# Patient Record
Sex: Female | Born: 1985 | Race: White | Hispanic: No | Marital: Single | Smoking: Former smoker
Health system: Southern US, Community
[De-identification: ages and names within clinical notes are randomized; demographics above are authoritative.]

## PROBLEM LIST (undated history)

## (undated) ENCOUNTER — Inpatient Hospital Stay (HOSPITAL_COMMUNITY): Payer: Self-pay

## (undated) ENCOUNTER — Emergency Department (HOSPITAL_COMMUNITY): Admission: EM | Payer: Self-pay | Source: Home / Self Care

## (undated) DIAGNOSIS — F419 Anxiety disorder, unspecified: Secondary | ICD-10-CM

## (undated) DIAGNOSIS — K529 Noninfective gastroenteritis and colitis, unspecified: Secondary | ICD-10-CM

## (undated) DIAGNOSIS — K5792 Diverticulitis of intestine, part unspecified, without perforation or abscess without bleeding: Secondary | ICD-10-CM

## (undated) DIAGNOSIS — F32A Depression, unspecified: Secondary | ICD-10-CM

## (undated) DIAGNOSIS — F329 Major depressive disorder, single episode, unspecified: Secondary | ICD-10-CM

## (undated) DIAGNOSIS — I82409 Acute embolism and thrombosis of unspecified deep veins of unspecified lower extremity: Secondary | ICD-10-CM

## (undated) DIAGNOSIS — J45909 Unspecified asthma, uncomplicated: Secondary | ICD-10-CM

## (undated) HISTORY — DX: Unspecified asthma, uncomplicated: J45.909

## (undated) HISTORY — PX: DILATION AND CURETTAGE OF UTERUS: SHX78

## (undated) HISTORY — DX: Depression, unspecified: F32.A

## (undated) HISTORY — DX: Anxiety disorder, unspecified: F41.9

## (undated) HISTORY — PX: LEG SURGERY: SHX1003

## (undated) HISTORY — PX: APPENDECTOMY: SHX54

## (undated) HISTORY — PX: CHOLECYSTECTOMY: SHX55

## (undated) HISTORY — DX: Noninfective gastroenteritis and colitis, unspecified: K52.9

## (undated) HISTORY — PX: TYMPANOSTOMY TUBE PLACEMENT: SHX32

## (undated) HISTORY — PX: FOOT SURGERY: SHX648

---
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
## 2013-07-26 ENCOUNTER — Emergency Department (HOSPITAL_COMMUNITY): Payer: Medicaid Other

## 2013-07-26 ENCOUNTER — Encounter (HOSPITAL_COMMUNITY): Payer: Self-pay | Admitting: Emergency Medicine

## 2013-07-26 ENCOUNTER — Emergency Department (HOSPITAL_COMMUNITY)
Admission: EM | Admit: 2013-07-26 | Discharge: 2013-07-26 | Disposition: A | Discharge status: Home / Self Care | Payer: Medicaid Other | Attending: Emergency Medicine | Admitting: Emergency Medicine

## 2013-07-26 ENCOUNTER — Emergency Department (HOSPITAL_COMMUNITY)
Admission: EM | Admit: 2013-07-26 | Discharge: 2013-07-26 | Disposition: A | Discharge status: Home / Self Care | Payer: Medicaid Other | Source: Home / Self Care | Attending: Emergency Medicine | Admitting: Emergency Medicine

## 2013-07-26 DIAGNOSIS — Z3202 Encounter for pregnancy test, result negative: Secondary | ICD-10-CM | POA: Insufficient documentation

## 2013-07-26 DIAGNOSIS — K029 Dental caries, unspecified: Secondary | ICD-10-CM | POA: Insufficient documentation

## 2013-07-26 DIAGNOSIS — R42 Dizziness and giddiness: Secondary | ICD-10-CM | POA: Insufficient documentation

## 2013-07-26 DIAGNOSIS — T887XXA Unspecified adverse effect of drug or medicament, initial encounter: Secondary | ICD-10-CM

## 2013-07-26 DIAGNOSIS — N189 Chronic kidney disease, unspecified: Secondary | ICD-10-CM | POA: Insufficient documentation

## 2013-07-26 DIAGNOSIS — H9209 Otalgia, unspecified ear: Secondary | ICD-10-CM | POA: Insufficient documentation

## 2013-07-26 DIAGNOSIS — R110 Nausea: Secondary | ICD-10-CM | POA: Insufficient documentation

## 2013-07-26 DIAGNOSIS — R6884 Jaw pain: Secondary | ICD-10-CM | POA: Insufficient documentation

## 2013-07-26 DIAGNOSIS — K0889 Other specified disorders of teeth and supporting structures: Secondary | ICD-10-CM

## 2013-07-26 DIAGNOSIS — F458 Other somatoform disorders: Secondary | ICD-10-CM | POA: Insufficient documentation

## 2013-07-26 DIAGNOSIS — Z79899 Other long term (current) drug therapy: Secondary | ICD-10-CM | POA: Insufficient documentation

## 2013-07-26 DIAGNOSIS — Z880 Allergy status to penicillin: Secondary | ICD-10-CM | POA: Insufficient documentation

## 2013-07-26 DIAGNOSIS — Z792 Long term (current) use of antibiotics: Secondary | ICD-10-CM | POA: Insufficient documentation

## 2013-07-26 DIAGNOSIS — F17200 Nicotine dependence, unspecified, uncomplicated: Secondary | ICD-10-CM | POA: Insufficient documentation

## 2013-07-26 DIAGNOSIS — T368X5A Adverse effect of other systemic antibiotics, initial encounter: Secondary | ICD-10-CM | POA: Insufficient documentation

## 2013-07-26 LAB — I-STAT TROPONIN, ED: TROPONIN I, POC: 0.01 ng/mL (ref 0.00–0.08)

## 2013-07-26 LAB — CBC
HCT: 42.4 % (ref 36.0–46.0)
HEMOGLOBIN: 15.3 g/dL — AB (ref 12.0–15.0)
MCH: 29.1 pg (ref 26.0–34.0)
MCHC: 36.1 g/dL — ABNORMAL HIGH (ref 30.0–36.0)
MCV: 80.8 fL (ref 78.0–100.0)
Platelets: 193 10*3/uL (ref 150–400)
RBC: 5.25 MIL/uL — AB (ref 3.87–5.11)
RDW: 12.5 % (ref 11.5–15.5)
WBC: 9 10*3/uL (ref 4.0–10.5)

## 2013-07-26 LAB — BASIC METABOLIC PANEL
BUN: 5 mg/dL — ABNORMAL LOW (ref 6–23)
CHLORIDE: 103 meq/L (ref 96–112)
CO2: 26 meq/L (ref 19–32)
Calcium: 9 mg/dL (ref 8.4–10.5)
Creatinine, Ser: 0.57 mg/dL (ref 0.50–1.10)
GFR calc Af Amer: >90 mL/min (ref >90)
GFR calc non Af Amer: >90 mL/min (ref >90)
Glucose, Bld: 82 mg/dL (ref 70–99)
Potassium: 3.4 mEq/L — ABNORMAL LOW (ref 3.7–5.3)
SODIUM: 141 meq/L (ref 137–147)

## 2013-07-26 LAB — PREGNANCY, URINE: PREG TEST UR: NEGATIVE

## 2013-07-26 LAB — PRO B NATRIURETIC PEPTIDE: Pro B Natriuretic peptide (BNP): 8 pg/mL (ref 0–125)

## 2013-07-26 MED ORDER — CLINDAMYCIN HCL 300 MG PO CAPS
300.0000 mg | ORAL_CAPSULE | Freq: Once | ORAL | Status: AC
  Administered 2013-07-26: 300 mg via ORAL
  Filled 2013-07-26: qty 1

## 2013-07-26 MED ORDER — CEPHALEXIN 500 MG PO CAPS: 500.0000 mg | ORAL_CAPSULE | Freq: Four times a day (QID) | ORAL | Status: DC

## 2013-07-26 MED ORDER — HYDROCODONE-ACETAMINOPHEN 5-325 MG PO TABS
1.0000 | ORAL_TABLET | Freq: Once | ORAL | Status: AC
  Administered 2013-07-26: 1 via ORAL
  Filled 2013-07-26: qty 1

## 2013-07-26 MED ORDER — CLINDAMYCIN HCL 300 MG PO CAPS: 300.0000 mg | ORAL_CAPSULE | Freq: Three times a day (TID) | ORAL | Status: DC

## 2013-07-26 MED ORDER — HYDROCODONE-ACETAMINOPHEN 5-325 MG PO TABS
1.0000 | ORAL_TABLET | Freq: Four times a day (QID) | ORAL | Status: DC | PRN
Start: 2013-07-26 — End: 2016-05-06

## 2013-07-26 NOTE — Discharge Instructions (Signed)
Return to the ED with any concerns including fever/chills, difficulty breathing, swelling of lips or tongue, fainting, decreased level of alertness/lethargy, or any other alarming symptoms  You should stop taking the clindamycin that you were prescribed earlier today

## 2013-07-26 NOTE — ED Notes (Signed)
Pt c/o left sided dental/jaw pain that her doctor believes is from grinding her teeth, now she has ear pain as well and pain not being relieved by ibuprofen and cant wait for her dentist apt in two weeks.

## 2013-07-26 NOTE — Progress Notes (Signed)
  CARE MANAGEMENT ED NOTE 07/26/2013  Patient:  Abigail James,Abigail James   Account Number:  [id number]  Date Initiated:  07/26/2013  Documentation initiated by:  Radford PaxFERRERO,Keevin Panebianco  Subjective/Objective Assessment:   Patient presents to Ed with chest pain dizziness and nausea.  Patient believes she is having an allergic reaction to medications she was prescribed earlier this am.     Subjective/Objective Assessment Detail:     Action/Plan:   Action/Plan Detail:   Anticipated DC Date:  07/26/2013     Status Recommendation to Physician:   Result of Recommendation:    Other ED Services  Consult Working Plan    DC Planning Services  Other  PCP issues    Choice offered to / List presented to:            Status of service:  Completed, signed off  ED Comments:   ED Comments Detail:  EDCM spoke to patient at bedside.  Patient reports she does not have a pcp with Medicaid insurnace.  [facility] asked patient if there was a doctor listed on her Medicaid card.  Patient replied, "[facility]."  [facility].  Patient reports she has not been there to see anyone as of yet.  [facility] informed patient if she wanted to change pcps on Medicaid card, she would have to inform DSS of the change made.  Patient verbalized understanding.  No further EDCM needs at this time.

## 2013-07-26 NOTE — ED Notes (Signed)
Pt states that around 1300 today started having central chest pain and dizziness with nausea that she thinks is caused be allergic reaction to the medications she was prescribed from here earlier this morning.

## 2013-07-26 NOTE — Discharge Instructions (Signed)
Take clindamycin for a week.   Continue taking motrin.   Take vicodin for severe pain. Do NOT drive with it.   Follow up with your dentist.   Return to ER if you have fever, worse pain.

## 2013-07-26 NOTE — ED Provider Notes (Signed)
CSN: [id number]     Arrival date & time 07/26/13  1520 History   First MD Initiated Contact with Patient 07/26/13 1537     Chief Complaint  Patient presents with  . Chest Pain  . Nausea  . Dizziness     (Consider location/radiation/quality/duration/timing/severity/associated sxs/prior Treatment) HPI Pt presents with c/o chest pain with dizziness which began after taking clindamycin which was prescribed earlier today.  She has been having jaw pain in her left jaw and has dental appointment scheduled for tomorrow.  No fever, no difficulty breathing or swallowing.  She was given rx for clindamycin and vicodin- states she only took clindamycin and about 30 minutes later felt dizziness and central chest pain.  No rash, no lip or tongue swelling.  No vomiting.  There are no other associated systemic symptoms, there are no other alleviating or modifying factors.   History reviewed. No pertinent past medical history. Past Surgical History  Procedure Laterality Date  . Cholecystectomy    . Appendectomy    . Tympanostomy tube placement    . Foot surgery    . Leg surgery    . Dilation and curettage of uterus     No family history on file. History  Substance Use Topics  . Smoking status: Current Every Day Smoker    Types: Cigarettes  . Smokeless tobacco: Never Used  . Alcohol Use: Not on file   OB History   Grav Para Term Preterm Abortions TAB SAB Ect Mult Living                 Review of Systems ROS reviewed and all otherwise negative except for mentioned in HPI    Allergies  Ciprofloxacin; Clindamycin/lincomycin; Sulfa antibiotics; Amoxicillin; and Penicillins  Home Medications   Prior to Admission medications   Medication Sig Start Date End Date Taking? Authorizing Provider  acetaminophen (TYLENOL) 500 MG tablet Take 1,000 mg by mouth every 6 (six) hours as needed for mild pain.   Yes Historical Provider, MD  clindamycin (CLEOCIN) 300 MG capsule Take 1 capsule (300 mg total)  by mouth 3 (three) times daily. X 7 days 07/26/13  Yes Richardean Canalavid H Yao, MD  ibuprofen (ADVIL,MOTRIN) 200 MG tablet Take 800 mg by mouth every 6 (six) hours as needed for moderate pain.   Yes Historical Provider, MD  albuterol (PROVENTIL HFA;VENTOLIN HFA) 108 (90 BASE) MCG/ACT inhaler Inhale 2 puffs into the lungs every 6 (six) hours as needed for wheezing or shortness of breath.    Historical Provider, MD  HYDROcodone-acetaminophen (NORCO/VICODIN) 5-325 MG per tablet Take 1-2 tablets by mouth every 6 (six) hours as needed. 07/26/13   Richardean Canalavid H Yao, MD   BP 111/62  Pulse 85  Temp(Src) 98.1 F (36.7 C) (Oral)  Resp 16  SpO2 98%  LMP 07/15/2013 Vitals reviewed Physical Exam Physical Examination: General appearance - alert, well appearing, and in no distress Mental status - alert, oriented to person, place, and time Eyes - pupils equal and reactive, no conjunctival injection, no scleral icterus Mouth - mucous membranes moist, pharynx normal without lesions, decay evident in left lower posterior molars, no significant gingival swelling or drainable abscess Chest - clear to auscultation, no wheezes, rales or rhonchi, symmetric air entry, normal respiratory effort Heart - normal rate, regular rhythm, normal S1, S2, no murmurs, rubs, clicks or gallops Abdomen - soft, nontender, nondistended, no masses or organomegaly Extremities - peripheral pulses normal, no pedal edema, no clubbing or cyanosis Skin - normal coloration and  turgor, no rashes  ED Course  Procedures (including critical care time)  4:05 PM Went to see patient and she is in xray Labs Review Labs Reviewed  CBC - Abnormal; Notable for the following:    RBC 5.25 (*)    Hemoglobin 15.3 (*)    MCHC 36.1 (*)    All other components within normal limits  BASIC METABOLIC PANEL - Abnormal; Notable for the following:    Potassium 3.4 (*)    BUN 5 (*)    All other components within normal limits  PRO B NATRIURETIC PEPTIDE  PREGNANCY, URINE   I-STAT TROPOININ, ED  POC URINE PREG, ED    Imaging Review Dg Chest 2 View  07/26/2013   CLINICAL DATA:  Chest pain.  Nausea and dizziness.  EXAM: CHEST  2 VIEW  COMPARISON:  None.  FINDINGS: The heart size and mediastinal contours are within normal limits. Both lungs are clear. The visualized skeletal structures are unremarkable.  IMPRESSION: Normal exam.   Electronically Signed   By: Geanie CooleyJim  Maxwell M.D.   On: 07/26/2013 16:34     EKG Interpretation   Date/Time:  Tuesday July 26 2013 15:28:19 EDT Ventricular Rate:  79 PR Interval:  142 QRS Duration: 81 QT Interval:  374 QTC Calculation: 429 R Axis:   69 Text Interpretation:  Sinus rhythm Borderline Q waves in inferior leads No  old tracing to compare Confirmed by [facility] LLCINKER  MD, Kanda Deluna [phone]([id number]) on  07/26/2013 10:00:50 PM      MDM   Final diagnoses:  Pain, dental  Medication side effect    Pt presenting with chest pain and dizziness after taking clindamycin.  Labs and CXR and EKG reassuring.  No signs of anaphylaxis or significant allergy, suspect more likely to be side effect/adverse reaction.  Doubt ACS, PE or other emergent cause of chest pain.  Pt allergic to PCN, given keflex which she states she has taken in the past without difficulty.  Pt states she has dental appointment scheduled for tomorrow which she was encouraged to attend.  Discharged with strict return precautions.  Pt agreeable with plan.    Ethelda ChickMartha K Linker, MD 07/26/13 2202

## 2013-07-26 NOTE — ED Provider Notes (Signed)
CSN: [id number]     Arrival date & time 07/26/13  0736 History   First MD Initiated Contact with Patient 07/26/13 0740     Chief Complaint  Patient presents with  . Dental Pain  . Otalgia     (Consider location/radiation/quality/duration/timing/severity/associated sxs/prior Treatment) The history is provided by the patient.  Abigail James is a 28 y.o. female here with L ear pain and left jaw pain. She states that she may have been grinding her teeth at night as per dentist. She also had a left lower wisdom tooth that was suppose to come out. Has been having increasing left jaw and tooth pain for several weeks, had trouble sleeping in the last few days because of the pain. She denies fevers. Notes some swelling around the left jaw area. Pain radiate to her left ear. Has dental appointment in 2 weeks. Has been taking motrin with minimal relief.    History reviewed. No pertinent past medical history. History reviewed. No pertinent past surgical history. No family history on file. History  Substance Use Topics  . Smoking status: Not on file  . Smokeless tobacco: Not on file  . Alcohol Use: Not on file   OB History   Grav Para Term Preterm Abortions TAB SAB Ect Mult Living                 Review of Systems  HENT: Positive for dental problem and ear pain.        Jaw pain   All other systems reviewed and are negative.     Allergies  Review of patient's allergies indicates not on file.  Home Medications   Prior to Admission medications   Not on File   BP 130/92  Pulse 79  Temp(Src) 98.3 F (36.8 C) (Oral)  Resp 18  SpO2 99%  LMP 07/15/2013 Physical Exam  Nursing note and vitals reviewed. Constitutional: She is oriented to person, place, and time. She appears well-nourished.  Slightly uncomfortable   HENT:  Head: Normocephalic.  Right Ear: External ear normal.  Left Ear: External ear normal.  Mouth/Throat:    l lower molar with dental caries. No obvious  periapical abscess. L submandibular gland enlarged. L TMJ sometimes dislocates when she opens mouth but relocates when she closes it.   Eyes: Conjunctivae are normal. Pupils are equal, round, and reactive to light.  Neck: Normal range of motion. Neck supple.  Cardiovascular: Normal rate, regular rhythm and normal heart sounds.   Pulmonary/Chest: Effort normal and breath sounds normal.  Abdominal: Soft. Bowel sounds are normal.  Musculoskeletal: Normal range of motion.  Neurological: She is alert and oriented to person, place, and time.  Skin: Skin is warm and dry.  Psychiatric: She has a normal mood and affect. Her behavior is normal. Judgment and thought content normal.    ED Course  Procedures (including critical care time) Labs Review Labs Reviewed - No data to display  Imaging Review No results found.   EKG Interpretation None      MDM   Final diagnoses:  None   [name] is a 28 y.o. female here with L jaw pain. Jaw pain multifactorial. Likely from grinding her teeth vs TMJ pain vs pain from dental caries. No evidence of abscess. PCN allergic. Will give clinda and vicodin. She has dental appointment. I told her that her lower molar may need to be removed. She should talk to her dentist regarding treatment options for teeth grinding.  Richardean Canalavid H Chidinma Clites, MD 07/26/13 [phone]

## 2013-11-11 ENCOUNTER — Emergency Department (HOSPITAL_COMMUNITY): Payer: Medicaid Other

## 2013-11-11 ENCOUNTER — Encounter (HOSPITAL_COMMUNITY): Payer: Self-pay | Admitting: Emergency Medicine

## 2013-11-11 ENCOUNTER — Emergency Department (HOSPITAL_COMMUNITY)
Admission: EM | Admit: 2013-11-11 | Discharge: 2013-11-11 | Disposition: A | Discharge status: Home / Self Care | Payer: Medicaid Other | Attending: Emergency Medicine | Admitting: Emergency Medicine

## 2013-11-11 DIAGNOSIS — W208XXA Other cause of strike by thrown, projected or falling object, initial encounter: Secondary | ICD-10-CM | POA: Diagnosis not present

## 2013-11-11 DIAGNOSIS — Y9389 Activity, other specified: Secondary | ICD-10-CM | POA: Diagnosis not present

## 2013-11-11 DIAGNOSIS — S8990XA Unspecified injury of unspecified lower leg, initial encounter: Secondary | ICD-10-CM | POA: Insufficient documentation

## 2013-11-11 DIAGNOSIS — Y929 Unspecified place or not applicable: Secondary | ICD-10-CM | POA: Diagnosis not present

## 2013-11-11 DIAGNOSIS — F17200 Nicotine dependence, unspecified, uncomplicated: Secondary | ICD-10-CM | POA: Diagnosis not present

## 2013-11-11 DIAGNOSIS — Z880 Allergy status to penicillin: Secondary | ICD-10-CM | POA: Diagnosis not present

## 2013-11-11 DIAGNOSIS — S99919A Unspecified injury of unspecified ankle, initial encounter: Principal | ICD-10-CM

## 2013-11-11 DIAGNOSIS — Z79899 Other long term (current) drug therapy: Secondary | ICD-10-CM | POA: Diagnosis not present

## 2013-11-11 DIAGNOSIS — S99922A Unspecified injury of left foot, initial encounter: Secondary | ICD-10-CM

## 2013-11-11 DIAGNOSIS — S99929A Unspecified injury of unspecified foot, initial encounter: Principal | ICD-10-CM

## 2013-11-11 MED ORDER — IBUPROFEN 800 MG PO TABS: 800.0000 mg | ORAL_TABLET | Freq: Three times a day (TID) | ORAL | Status: DC

## 2013-11-11 MED ORDER — OXYCODONE-ACETAMINOPHEN 5-325 MG PO TABS
1.0000 | ORAL_TABLET | Freq: Once | ORAL | Status: AC
  Administered 2013-11-11: 1 via ORAL
  Filled 2013-11-11: qty 1

## 2013-11-11 NOTE — ED Provider Notes (Signed)
CSN: [id number]     Arrival date & time 11/11/13  1541 History  This chart was scribed for non-physician practitioner, Ladona Mow, PA-C, working with Mirian Mo, MD by Milly Jakob, ED Scribe. The patient was seen in room WTR6/WTR6. Patient's care was started at 4:10 PM.    Chief Complaint  Patient presents with  . Foot Injury   The history is provided by the patient. No language interpreter was used.   HPI Comments: Abigail James is a 28 y.o. female who presents to the Emergency Department complaining of pain in her left foot that his radiating up her leg. She states that a wooden bed frame fell onto her foot while she was moving, and she states that she "jerked" her foot up. She reports that she was able to walk with a limp. She reports that the pain is exacerbated by movement. She states that she does not like when people touch her feet.    History reviewed. No pertinent past medical history. Past Surgical History  Procedure Laterality Date  . Cholecystectomy    . Appendectomy    . Tympanostomy tube placement    . Foot surgery    . Leg surgery    . Dilation and curettage of uterus     No family history on file. History  Substance Use Topics  . Smoking status: Current Every Day Smoker    Types: Cigarettes  . Smokeless tobacco: Never Used  . Alcohol Use: Not on file   OB History   Grav Para Term Preterm Abortions TAB SAB Ect Mult Living                 Review of Systems  Musculoskeletal: Positive for arthralgias and joint swelling.   Allergies  Ciprofloxacin; Clindamycin/lincomycin; Sulfa antibiotics; Amoxicillin; and Penicillins  Home Medications   Prior to Admission medications   Medication Sig Start Date End Date Taking? Authorizing Provider  acetaminophen (TYLENOL) 500 MG tablet Take 1,000 mg by mouth every 6 (six) hours as needed for mild pain.    Historical Provider, MD  albuterol (PROVENTIL HFA;VENTOLIN HFA) 108 (90 BASE) MCG/ACT inhaler Inhale 2 puffs  into the lungs every 6 (six) hours as needed for wheezing or shortness of breath.    Historical Provider, MD  cephALEXin (KEFLEX) 500 MG capsule Take 1 capsule (500 mg total) by mouth 4 (four) times daily. 07/26/13   Ethelda Chick, MD  clindamycin (CLEOCIN) 300 MG capsule Take 1 capsule (300 mg total) by mouth 3 (three) times daily. X 7 days 07/26/13   Richardean Canal, MD  HYDROcodone-acetaminophen (NORCO/VICODIN) 5-325 MG per tablet Take 1-2 tablets by mouth every 6 (six) hours as needed. 07/26/13   Richardean Canal, MD  ibuprofen (ADVIL,MOTRIN) 200 MG tablet Take 800 mg by mouth every 6 (six) hours as needed for moderate pain.    Historical Provider, MD  ibuprofen (ADVIL,MOTRIN) 800 MG tablet Take 1 tablet (800 mg total) by mouth 3 (three) times daily. 11/11/13   Monte Fantasia, PA-C   Triage Vitals: BP 122/67  Pulse 79  Temp(Src) 98.2 F (36.8 C) (Oral)  Resp 16  SpO2 98% Physical Exam  Nursing note and vitals reviewed. Constitutional: She is oriented to person, place, and time. She appears well-developed and well-nourished. No distress.  HENT:  Head: Normocephalic and atraumatic.  Eyes: Conjunctivae and EOM are normal.  Neck: Neck supple. No tracheal deviation present.  Cardiovascular: Normal rate.   Pulmonary/Chest: Effort normal. No respiratory  distress.  Musculoskeletal: Normal range of motion.  Neurological: She is alert and oriented to person, place, and time.  Skin: Skin is warm and dry.  Psychiatric: She has a normal mood and affect. Her behavior is normal.    ED Course  Procedures (including critical care time)  4:15 PM-Discussed treatment plan which includes pain medication and X-Ray with pt at bedside and pt agreed to plan.   Labs Review Labs Reviewed - No data to display  Imaging Review Dg Knee Complete 4 Views Left  11/11/2013   CLINICAL DATA:  Dropped object on leg with pain  EXAM: LEFT KNEE - COMPLETE 4+ VIEW  COMPARISON:  None.  FINDINGS: The left knee joint spaces  appear normal. No fracture is seen. No effusion is noted.  IMPRESSION: Negative.   Electronically Signed   By: Dwyane DeePaul  Barry M.D.   On: 11/11/2013 16:45   Dg Foot Complete Left  11/11/2013   CLINICAL DATA:  Dropped object on foot with pain  EXAM: LEFT FOOT - COMPLETE 3+ VIEW  COMPARISON:  None.  FINDINGS: Tarsal metatarsal alignment is normal. No fracture is seen. Joint spaces appear normal.  IMPRESSION: Negative.   Electronically Signed   By: Dwyane DeePaul  Barry M.D.   On: 11/11/2013 16:48     EKG Interpretation None      MDM   Final diagnoses:  Foot injury, left, initial encounter    28 year old female who presents to the ER with left foot pain after dropping a wooden plank on it. Mild swelling noted to the dorsal aspect of patient's foot with no deformity erythema, ecchymosis.  Patient also endorses some pain in the region of her proximal fibula in her left lower extremity. Patient states she may have twisted her ankle after dropping the object on her foot. Neuro exam unremarkable. Motor strength 5 out of 5 lower extremities bilaterally. Homans sign negative Patient able to ambulate on foot. Workup to rule out fracture of left foot, proximal fibula.   5:00 PM: Patient's x-rays returned negative with no fracture of her foot or proximal fibular region. Instructed patient to take ibuprofen for pain and swelling, use ice and rest her extremity.  I encouraged patient to followup with her PCP as needed and return to the ER should her symptoms change, worsen or she should have a questions or concerns.  I have personally seen and reviewed information documented by scribe.   Signed,  Ladona MowJoe Micalah Cabezas, PA-C 8:29 PM   Monte FantasiaJoseph W Kenji Mapel, PA-C 11/11/13 2030

## 2013-11-11 NOTE — Discharge Instructions (Signed)
Rest foot and use ice.  Take Ibuprofen 800mg  every 8 hours as needed for pain.     Ankle Pain Ankle pain is a common symptom. The bones, cartilage, tendons, and muscles of the ankle joint perform a lot of work each day. The ankle joint holds your body weight and allows you to move around. Ankle pain can occur on either side or back of 1 or both ankles. Ankle pain may be sharp and burning or dull and aching. There may be tenderness, stiffness, redness, or warmth around the ankle. The pain occurs more often when a person walks or puts pressure on the ankle. CAUSES  There are many reasons ankle pain can develop. It is important to work with your caregiver to identify the cause since many conditions can impact the bones, cartilage, muscles, and tendons. Causes for ankle pain include:  Injury, including a break (fracture), sprain, or strain often due to a fall, sports, or a high-impact activity.  Swelling (inflammation) of a tendon (tendonitis).  Achilles tendon rupture.  Ankle instability after repeated sprains and strains.  Poor foot alignment.  Pressure on a nerve (tarsal tunnel syndrome).  Arthritis in the ankle or the lining of the ankle.  Crystal formation in the ankle (gout or pseudogout). DIAGNOSIS  A diagnosis is based on your medical history, your symptoms, results of your physical exam, and results of diagnostic tests. Diagnostic tests may include X-ray exams or a computerized magnetic scan (magnetic resonance imaging, MRI). TREATMENT  Treatment will depend on the cause of your ankle pain and may include:  Keeping pressure off the ankle and limiting activities.  Using crutches or other walking support (a cane or brace).  Using rest, ice, compression, and elevation.  Participating in physical therapy or home exercises.  Wearing shoe inserts or special shoes.  Losing weight.  Taking medications to reduce pain or swelling or receiving an injection.  Undergoing  surgery. HOME CARE INSTRUCTIONS   Only take over-the-counter or prescription medicines for pain, discomfort, or fever as directed by your caregiver.  Put ice on the injured area.  Put ice in a plastic bag.  Place a towel between your skin and the bag.  Leave the ice on for 15-20 minutes at a time, 03-04 times a day.  Keep your leg raised (elevated) when possible to lessen swelling.  Avoid activities that cause ankle pain.  Follow specific exercises as directed by your caregiver.  Record how often you have ankle pain, the location of the pain, and what it feels like. This information may be helpful to you and your caregiver.  Ask your caregiver about returning to work or sports and whether you should drive.  Follow up with your caregiver for further examination, therapy, or testing as directed. SEEK MEDICAL CARE IF:   Pain or swelling continues or worsens beyond 1 week.  You have an oral temperature above 102 F (38.9 C).  You are feeling unwell or have chills.  You are having an increasingly difficult time with walking.  You have loss of sensation or other new symptoms.  You have questions or concerns. MAKE SURE YOU:   Understand these instructions.  Will watch your condition.  Will get help right away if you are not doing well or get worse. Document Released: 09/04/2009 Document Revised: 06/09/2011 Document Reviewed: 09/04/2009 [facility] Patient Information 2015 [place], [place]. This information is not intended to replace advice given to you by your health care provider. Make sure you discuss any questions you  have with your health care provider. ° °

## 2013-11-11 NOTE — ED Notes (Signed)
Pt states a wooden bed frame fell on the top of her L foot. Pt has some swelling to top of L foot. Pt able to ambulate with limp.

## 2013-11-11 NOTE — ED Notes (Signed)
Pt has a ride home.  

## 2013-11-13 NOTE — ED Provider Notes (Signed)
Medical screening examination/treatment/procedure(s) were performed by non-physician practitioner and as supervising physician I was immediately available for consultation/collaboration.   EKG Interpretation None        Matthew Gentry, MD 11/13/13 1445 

## 2014-01-25 ENCOUNTER — Other Ambulatory Visit (HOSPITAL_COMMUNITY): Payer: Self-pay | Admitting: Obstetrics & Gynecology

## 2014-01-25 DIAGNOSIS — N979 Female infertility, unspecified: Secondary | ICD-10-CM

## 2014-01-31 ENCOUNTER — Ambulatory Visit (HOSPITAL_COMMUNITY): Payer: Medicaid Other

## 2014-04-26 ENCOUNTER — Emergency Department (HOSPITAL_COMMUNITY)
Admission: EM | Admit: 2014-04-26 | Discharge: 2014-04-27 | Disposition: A | Discharge status: Home / Self Care | Payer: Medicaid Other | Attending: Emergency Medicine | Admitting: Emergency Medicine

## 2014-04-26 ENCOUNTER — Emergency Department (HOSPITAL_COMMUNITY): Payer: Medicaid Other

## 2014-04-26 ENCOUNTER — Encounter (HOSPITAL_COMMUNITY): Payer: Self-pay | Admitting: Emergency Medicine

## 2014-04-26 DIAGNOSIS — Z79899 Other long term (current) drug therapy: Secondary | ICD-10-CM | POA: Insufficient documentation

## 2014-04-26 DIAGNOSIS — G43909 Migraine, unspecified, not intractable, without status migrainosus: Secondary | ICD-10-CM | POA: Insufficient documentation

## 2014-04-26 DIAGNOSIS — Z880 Allergy status to penicillin: Secondary | ICD-10-CM | POA: Insufficient documentation

## 2014-04-26 DIAGNOSIS — Z3202 Encounter for pregnancy test, result negative: Secondary | ICD-10-CM | POA: Insufficient documentation

## 2014-04-26 DIAGNOSIS — J069 Acute upper respiratory infection, unspecified: Secondary | ICD-10-CM | POA: Diagnosis not present

## 2014-04-26 DIAGNOSIS — R079 Chest pain, unspecified: Secondary | ICD-10-CM | POA: Diagnosis present

## 2014-04-26 DIAGNOSIS — G43809 Other migraine, not intractable, without status migrainosus: Secondary | ICD-10-CM

## 2014-04-26 DIAGNOSIS — Z720 Tobacco use: Secondary | ICD-10-CM | POA: Diagnosis not present

## 2014-04-26 LAB — CBC WITH DIFFERENTIAL/PLATELET
Basophils Absolute: 0 10*3/uL (ref 0.0–0.1)
Basophils Relative: 0 % (ref 0–1)
Eosinophils Absolute: 0.3 10*3/uL (ref 0.0–0.7)
Eosinophils Relative: 3 % (ref 0–5)
HCT: 43.8 % (ref 36.0–46.0)
HEMOGLOBIN: 15.4 g/dL — AB (ref 12.0–15.0)
Lymphocytes Relative: 31 % (ref 12–46)
Lymphs Abs: 2.4 10*3/uL (ref 0.7–4.0)
MCH: 29.3 pg (ref 26.0–34.0)
MCHC: 35.2 g/dL (ref 30.0–36.0)
MCV: 83.3 fL (ref 78.0–100.0)
MONO ABS: 0.7 10*3/uL (ref 0.1–1.0)
MONOS PCT: 8 % (ref 3–12)
NEUTROS ABS: 4.4 10*3/uL (ref 1.7–7.7)
Neutrophils Relative %: 58 % (ref 43–77)
PLATELETS: 229 10*3/uL (ref 150–400)
RBC: 5.26 MIL/uL — ABNORMAL HIGH (ref 3.87–5.11)
RDW: 12.4 % (ref 11.5–15.5)
WBC: 7.8 10*3/uL (ref 4.0–10.5)

## 2014-04-26 LAB — URINALYSIS, ROUTINE W REFLEX MICROSCOPIC
Bilirubin Urine: NEGATIVE
Glucose, UA: NEGATIVE mg/dL
Hgb urine dipstick: NEGATIVE
Ketones, ur: NEGATIVE mg/dL
LEUKOCYTES UA: NEGATIVE
Nitrite: NEGATIVE
PROTEIN: NEGATIVE mg/dL
SPECIFIC GRAVITY, URINE: 1.026 (ref 1.005–1.030)
Urobilinogen, UA: 1 mg/dL (ref 0.0–1.0)
pH: 6 (ref 5.0–8.0)

## 2014-04-26 LAB — COMPREHENSIVE METABOLIC PANEL
ALBUMIN: 4.5 g/dL (ref 3.5–5.2)
ALT: 14 U/L (ref 0–35)
AST: 19 U/L (ref 0–37)
Alkaline Phosphatase: 58 U/L (ref 39–117)
Anion gap: 9 (ref 5–15)
BILIRUBIN TOTAL: 0.8 mg/dL (ref 0.3–1.2)
BUN: 7 mg/dL (ref 6–23)
CALCIUM: 8.9 mg/dL (ref 8.4–10.5)
CO2: 25 mmol/L (ref 19–32)
CREATININE: 0.62 mg/dL (ref 0.50–1.10)
Chloride: 103 mmol/L (ref 96–112)
GFR calc Af Amer: >90 mL/min (ref >90)
GFR calc non Af Amer: >90 mL/min (ref >90)
Glucose, Bld: 93 mg/dL (ref 70–99)
POTASSIUM: 3.4 mmol/L — AB (ref 3.5–5.1)
Sodium: 137 mmol/L (ref 135–145)
Total Protein: 7.1 g/dL (ref 6.0–8.3)

## 2014-04-26 LAB — LIPASE, BLOOD: LIPASE: 26 U/L (ref 11–59)

## 2014-04-26 LAB — RAPID STREP SCREEN (MED CTR MEBANE ONLY): Streptococcus, Group A Screen (Direct): NEGATIVE

## 2014-04-26 LAB — I-STAT TROPONIN, ED: Troponin i, poc: 0 ng/mL (ref 0.00–0.08)

## 2014-04-26 LAB — PREGNANCY, URINE: Preg Test, Ur: NEGATIVE

## 2014-04-26 MED ORDER — DIPHENHYDRAMINE HCL 50 MG/ML IJ SOLN
25.0000 mg | Freq: Once | INTRAMUSCULAR | Status: AC
  Administered 2014-04-26: 25 mg via INTRAVENOUS
  Filled 2014-04-26: qty 1

## 2014-04-26 MED ORDER — PROCHLORPERAZINE EDISYLATE 5 MG/ML IJ SOLN
10.0000 mg | Freq: Once | INTRAMUSCULAR | Status: AC
  Administered 2014-04-26: 10 mg via INTRAVENOUS
  Filled 2014-04-26: qty 2

## 2014-04-26 MED ORDER — ONDANSETRON 8 MG PO TBDP
8.0000 mg | ORAL_TABLET | Freq: Once | ORAL | Status: DC
  Filled 2014-04-26: qty 1

## 2014-04-26 MED ORDER — KETOROLAC TROMETHAMINE 30 MG/ML IJ SOLN
30.0000 mg | Freq: Once | INTRAMUSCULAR | Status: AC
  Administered 2014-04-26: 30 mg via INTRAVENOUS
  Filled 2014-04-26: qty 1

## 2014-04-26 MED ORDER — SODIUM CHLORIDE 0.9 % IV BOLUS (SEPSIS)
1000.0000 mL | Freq: Once | INTRAVENOUS | Status: AC
  Administered 2014-04-26: 1000 mL via INTRAVENOUS

## 2014-04-26 NOTE — ED Notes (Signed)
Pt reports throwing up the zofran given in triage

## 2014-04-26 NOTE — ED Notes (Addendum)
Patient reports right sided chest pain without radiation. Also having generalized body aches starting Tuesday. Has associated dizziness. Denies fever. Emesis x1 yesterday. Nauseous today. Denies diarrhea. Has had sore throat and feels chills. Has not tried any OTC medications and has had no OTC medications today. Hx migraines. Took Naproxen last night with no alleviation of symptoms. RR even/unlabored. No other complaints/concerns. Has had panic attacks in the past. Is not on any anti-anxiety medications.

## 2014-04-27 MED ORDER — GUAIFENESIN ER 1200 MG PO TB12: 1.0000 | ORAL_TABLET | Freq: Two times a day (BID) | ORAL | Status: DC

## 2014-04-27 MED ORDER — PREDNISONE 50 MG PO TABS: 50.0000 mg | ORAL_TABLET | Freq: Every day | ORAL | Status: DC

## 2014-04-27 MED ORDER — ACETAMINOPHEN-CODEINE 120-12 MG/5ML PO SOLN: 10.0000 mL | ORAL | Status: DC | PRN

## 2014-04-27 NOTE — ED Provider Notes (Signed)
CSN: [id number]     Arrival date & time 04/26/14  [id number] History   First MD Initiated Contact with Patient 04/26/14 2210     Chief Complaint  Patient presents with  . Chest Pain  . Generalized Body Aches     (Consider location/radiation/quality/duration/timing/severity/associated sxs/prior Treatment) HPI Patient presents to the emergency department with cough, sore throat, nasal congestion, body aches and chills.  The patient states that these symptoms started 2 days prior to arrival.  Patient states that she did not take any medications for her symptoms.  She states that her chest is also hurting.  The patient states that seems make her condition better or worse.  The patient denies shortness of breath, headache, blurred vision, weakness, dizziness, back pain, neck pain, stiffness, difficulty swallowing, difficulty breathing, hemoptysis, hematemesis, bloody stool, diarrhea, abdominal pain, nausea, vomiting, or syncope.  The patient states that her chest.  This seemed to hurt a little bit more when she coughs History reviewed. No pertinent past medical history. Past Surgical History  Procedure Laterality Date  . Cholecystectomy    . Appendectomy    . Tympanostomy tube placement    . Foot surgery    . Leg surgery    . Dilation and curettage of uterus     History reviewed. No pertinent family history. History  Substance Use Topics  . Smoking status: Current Every Day Smoker    Types: Cigarettes  . Smokeless tobacco: Never Used  . Alcohol Use: Not on file   OB History    No data available     Review of Systems  All other systems negative except as documented in the HPI. All pertinent positives and negatives as reviewed in the HPI.  Allergies  Ciprofloxacin; Clindamycin/lincomycin; Sulfa antibiotics; Amoxicillin; and Penicillins  Home Medications   Prior to Admission medications   Medication Sig Start Date End Date Taking? Authorizing Provider  acetaminophen (TYLENOL) 500 MG  tablet Take 1,000 mg by mouth every 6 (six) hours as needed for mild pain.   Yes Historical Provider, MD  albuterol (PROVENTIL HFA;VENTOLIN HFA) 108 (90 BASE) MCG/ACT inhaler Inhale 2 puffs into the lungs every 6 (six) hours as needed for wheezing or shortness of breath.   Yes Historical Provider, MD  ibuprofen (ADVIL,MOTRIN) 200 MG tablet Take 800 mg by mouth every 6 (six) hours as needed for moderate pain.   Yes Historical Provider, MD  Multiple Vitamin (MULTIVITAMIN WITH MINERALS) TABS tablet Take 1 tablet by mouth daily.   Yes Historical Provider, MD  naproxen (NAPROSYN) 500 MG tablet Take 500 mg by mouth as needed for headache.   Yes Historical Provider, MD  Prenatal Vit-Fe Fumarate-FA (PRENATAL MULTIVITAMIN) TABS tablet Take 1 tablet by mouth daily at 12 noon.   Yes Historical Provider, MD  cephALEXin (KEFLEX) 500 MG capsule Take 1 capsule (500 mg total) by mouth 4 (four) times daily. Patient not taking: Reported on 04/26/2014 07/26/13   Ethelda ChickMartha K Linker, MD  clindamycin (CLEOCIN) 300 MG capsule Take 1 capsule (300 mg total) by mouth 3 (three) times daily. X 7 days Patient not taking: Reported on 04/26/2014 07/26/13   Richardean Canalavid H Yao, MD  HYDROcodone-acetaminophen (NORCO/VICODIN) 5-325 MG per tablet Take 1-2 tablets by mouth every 6 (six) hours as needed. Patient not taking: Reported on 04/26/2014 07/26/13   Richardean Canalavid H Yao, MD  ibuprofen (ADVIL,MOTRIN) 800 MG tablet Take 1 tablet (800 mg total) by mouth 3 (three) times daily. Patient not taking: Reported on 04/26/2014 11/11/13   Jomarie LongsJoseph  W Mintz, PA-C   BP 112/64 mmHg  Pulse 105  Temp(Src) 98.1 F (36.7 C) (Oral)  Resp 13  SpO2 97%  LMP 04/19/2014 (Approximate) Physical Exam  Constitutional: She is oriented to person, place, and time. She appears well-developed and well-nourished. No distress.  HENT:  Head: Normocephalic and atraumatic.  Mouth/Throat: Uvula is midline and mucous membranes are normal. No trismus in the jaw. No uvula swelling. Posterior  oropharyngeal edema present. No oropharyngeal exudate, posterior oropharyngeal erythema or tonsillar abscesses.  Eyes: Pupils are equal, round, and reactive to light.  Neck: Normal range of motion. Neck supple.  Cardiovascular: Normal rate, regular rhythm and normal heart sounds.  Exam reveals no gallop and no friction rub.   No murmur heard. Pulmonary/Chest: Effort normal and breath sounds normal. No respiratory distress.  Abdominal: Soft. Bowel sounds are normal. She exhibits no distension. There is no tenderness. There is no guarding.  Neurological: She is alert and oriented to person, place, and time. She exhibits normal muscle tone. Coordination normal.  Skin: Skin is warm and dry. No rash noted. No erythema.  Nursing note and vitals reviewed.   ED Course  Procedures (including critical care time) Labs Review Labs Reviewed  CBC WITH DIFFERENTIAL/PLATELET - Abnormal; Notable for the following:    RBC 5.26 (*)    Hemoglobin 15.4 (*)    All other components within normal limits  COMPREHENSIVE METABOLIC PANEL - Abnormal; Notable for the following:    Potassium 3.4 (*)    All other components within normal limits  RAPID STREP SCREEN  CULTURE, GROUP A STREP  LIPASE, BLOOD  URINALYSIS, ROUTINE W REFLEX MICROSCOPIC  PREGNANCY, URINE  I-STAT TROPOININ, ED    Imaging Review Dg Chest 2 View  04/26/2014   CLINICAL DATA:  Right-sided chest pain with radiation. Body aches. Vomiting yesterday. Nauseous today. No diarrhea.  EXAM: CHEST  2 VIEW  COMPARISON:  07/26/2013  FINDINGS: The heart size and mediastinal contours are within normal limits. Both lungs are clear. The visualized skeletal structures are unremarkable.  IMPRESSION: No active cardiopulmonary disease.   Electronically Signed   By: Elige Ko   On: 04/26/2014 22:58     EKG Interpretation   Date/Time:  Wednesday April 26 2014 18:34:33 EST Ventricular Rate:  68 PR Interval:  131 QRS Duration: 73 QT Interval:  385 QTC  Calculation: 409 R Axis:   48 Text Interpretation:  Sinus rhythm Borderline Q waves in lateral leads  Baseline wander in lead(s) II aVF ED PHYSICIAN INTERPRETATION AVAILABLE IN  [facility] Confirmed by TEST, Record ([id number]) on 04/28/2014 7:59:14 AM     Patient most likely has an influenza-like illness and will treat her symptomatically.  She has had normal vital signs and does not show any significant signs of it indicate pulmonary embolism.  She has minimal risk factors for cardiac disease and her chest x-ray did not show any signs of significant infection.  Patient is advised return here as needed.  Told to increase her fluid intake and rest as much as possible.  I advised her that this may take up to 2 weeks to fully resolve MDM   Final diagnoses:  None       Carlyle Dolly, PA-C 04/29/14 6045  Rolland Porter, MD 05/03/14 [phone]

## 2014-04-27 NOTE — Discharge Instructions (Signed)
Return here as needed.  Follow up with a primary care Dr. increase her fluid intake and rest as much as possible has received

## 2014-04-28 ENCOUNTER — Emergency Department (HOSPITAL_BASED_OUTPATIENT_CLINIC_OR_DEPARTMENT_OTHER): Payer: Medicaid Other

## 2014-04-28 ENCOUNTER — Emergency Department (HOSPITAL_COMMUNITY)
Admission: EM | Admit: 2014-04-28 | Discharge: 2014-04-28 | Discharge status: Against Medical Advice | Payer: Medicaid Other

## 2014-04-28 ENCOUNTER — Encounter (HOSPITAL_BASED_OUTPATIENT_CLINIC_OR_DEPARTMENT_OTHER): Payer: Self-pay

## 2014-04-28 ENCOUNTER — Emergency Department (HOSPITAL_BASED_OUTPATIENT_CLINIC_OR_DEPARTMENT_OTHER)
Admission: EM | Admit: 2014-04-28 | Discharge: 2014-04-28 | Disposition: A | Discharge status: Home / Self Care | Payer: Medicaid Other | Attending: Emergency Medicine | Admitting: Emergency Medicine

## 2014-04-28 DIAGNOSIS — R079 Chest pain, unspecified: Secondary | ICD-10-CM | POA: Diagnosis present

## 2014-04-28 DIAGNOSIS — Z79899 Other long term (current) drug therapy: Secondary | ICD-10-CM | POA: Insufficient documentation

## 2014-04-28 DIAGNOSIS — R6889 Other general symptoms and signs: Secondary | ICD-10-CM

## 2014-04-28 DIAGNOSIS — Z720 Tobacco use: Secondary | ICD-10-CM | POA: Diagnosis not present

## 2014-04-28 DIAGNOSIS — R51 Headache: Secondary | ICD-10-CM | POA: Diagnosis not present

## 2014-04-28 DIAGNOSIS — Z880 Allergy status to penicillin: Secondary | ICD-10-CM | POA: Diagnosis not present

## 2014-04-28 DIAGNOSIS — R21 Rash and other nonspecific skin eruption: Secondary | ICD-10-CM | POA: Diagnosis not present

## 2014-04-28 DIAGNOSIS — Z7952 Long term (current) use of systemic steroids: Secondary | ICD-10-CM | POA: Diagnosis not present

## 2014-04-28 DIAGNOSIS — B349 Viral infection, unspecified: Secondary | ICD-10-CM | POA: Diagnosis not present

## 2014-04-28 LAB — CULTURE, GROUP A STREP

## 2014-04-28 LAB — BASIC METABOLIC PANEL
Anion gap: 5 (ref 5–15)
BUN: 2 mg/dL — ABNORMAL LOW (ref 6–23)
CO2: 22 mmol/L (ref 19–32)
CREATININE: 0.62 mg/dL (ref 0.50–1.10)
Calcium: 8.8 mg/dL (ref 8.4–10.5)
Chloride: 109 mmol/L (ref 96–112)
GFR calc Af Amer: >90 mL/min (ref >90)
GFR calc non Af Amer: >90 mL/min (ref >90)
Glucose, Bld: 148 mg/dL — ABNORMAL HIGH (ref 70–99)
Potassium: 3.2 mmol/L — ABNORMAL LOW (ref 3.5–5.1)
Sodium: 136 mmol/L (ref 135–145)

## 2014-04-28 LAB — CBC
HEMATOCRIT: 42.6 % (ref 36.0–46.0)
Hemoglobin: 14.8 g/dL (ref 12.0–15.0)
MCH: 28.4 pg (ref 26.0–34.0)
MCHC: 34.7 g/dL (ref 30.0–36.0)
MCV: 81.6 fL (ref 78.0–100.0)
PLATELETS: 207 10*3/uL (ref 150–400)
RBC: 5.22 MIL/uL — ABNORMAL HIGH (ref 3.87–5.11)
RDW: 12.4 % (ref 11.5–15.5)
WBC: 8.5 10*3/uL (ref 4.0–10.5)

## 2014-04-28 LAB — MONONUCLEOSIS SCREEN: Mono Screen: NEGATIVE

## 2014-04-28 NOTE — ED Provider Notes (Signed)
CSN: [id number]     Arrival date & time 04/28/14  1937 History   First MD Initiated Contact with Patient 04/28/14 2003     Chief Complaint  Patient presents with  . Chest Pain     (Consider location/radiation/quality/duration/timing/severity/associated sxs/prior Treatment) HPI Comments: 29 year old female presenting with multiple complaints. Patient reports sore throat, chest pain, generalized body aches and headaches 5 days. Patient was [facility] 2 days ago and diagnosed with a viral illness, started on prednisone, Tylenol codeine and cough medication which she has been taking with only mild relief. States the sore throat is slightly better, however the chest pain has worsened. 2 days ago she had a negative rapid strep and a negative chest x-ray. [facility], however left because there was a 5 hour wait and came here instead. Denies fevers. States her chest hurts all over. No aggravating or alleviating factors. She's had a slight, nonproductive cough. She is beginning to develop a rash on her chest. States she was told to return if her symptoms worsened or did not improve.  Patient is a 29 y.o. female presenting with chest pain. The history is provided by the patient and medical records.  Chest Pain Associated symptoms: cough and headache     History reviewed. No pertinent past medical history. Past Surgical History  Procedure Laterality Date  . Cholecystectomy    . Appendectomy    . Tympanostomy tube placement    . Foot surgery    . Leg surgery    . Dilation and curettage of uterus     History reviewed. No pertinent family history. History  Substance Use Topics  . Smoking status: Current Every Day Smoker    Types: Cigarettes  . Smokeless tobacco: Never Used  . Alcohol Use: No   OB History    No data available     Review of Systems  HENT: Positive for sinus pressure and sore throat.   Respiratory:  Positive for cough.   Cardiovascular: Positive for chest pain.  Musculoskeletal: Positive for myalgias and arthralgias.  Skin: Positive for rash.  Neurological: Positive for headaches.  All other systems reviewed and are negative.     Allergies  Ciprofloxacin; Clindamycin/lincomycin; Sulfa antibiotics; Amoxicillin; and Penicillins  Home Medications   Prior to Admission medications   Medication Sig Start Date End Date Taking? Authorizing Provider  acetaminophen (TYLENOL) 500 MG tablet Take 1,000 mg by mouth every 6 (six) hours as needed for mild pain.   Yes Historical Provider, MD  acetaminophen-codeine 120-12 MG/5ML solution Take 10 mLs by mouth every 4 (four) hours as needed for moderate pain. 04/27/14  Yes Jamesetta Orleanshristopher W Lawyer, PA-C  albuterol (PROVENTIL HFA;VENTOLIN HFA) 108 (90 BASE) MCG/ACT inhaler Inhale 2 puffs into the lungs every 6 (six) hours as needed for wheezing or shortness of breath.   Yes Historical Provider, MD  Guaifenesin 1200 MG TB12 Take 1 tablet (1,200 mg total) by mouth 2 (two) times daily. 04/27/14  Yes Jamesetta Orleanshristopher W Lawyer, PA-C  ibuprofen (ADVIL,MOTRIN) 200 MG tablet Take 800 mg by mouth every 6 (six) hours as needed for moderate pain.   Yes Historical Provider, MD  Multiple Vitamin (MULTIVITAMIN WITH MINERALS) TABS tablet Take 1 tablet by mouth daily.   Yes Historical Provider, MD  naproxen (NAPROSYN) 500 MG tablet Take 500 mg by mouth as needed for headache.   Yes Historical Provider, MD  predniSONE (DELTASONE) 50 MG tablet Take 1 tablet (50  mg total) by mouth daily. 04/27/14  Yes Carlyle Dolly, PA-C  Prenatal Vit-Fe Fumarate-FA (PRENATAL MULTIVITAMIN) TABS tablet Take 1 tablet by mouth daily at 12 noon.   Yes Historical Provider, MD  cephALEXin (KEFLEX) 500 MG capsule Take 1 capsule (500 mg total) by mouth 4 (four) times daily. Patient not taking: Reported on 04/26/2014 07/26/13   Ethelda Chick, MD  clindamycin (CLEOCIN) 300 MG capsule Take 1 capsule (300  mg total) by mouth 3 (three) times daily. X 7 days Patient not taking: Reported on 04/26/2014 07/26/13   Richardean Canal, MD  HYDROcodone-acetaminophen (NORCO/VICODIN) 5-325 MG per tablet Take 1-2 tablets by mouth every 6 (six) hours as needed. Patient not taking: Reported on 04/26/2014 07/26/13   Richardean Canal, MD  ibuprofen (ADVIL,MOTRIN) 800 MG tablet Take 1 tablet (800 mg total) by mouth 3 (three) times daily. Patient not taking: Reported on 04/26/2014 11/11/13   Monte Fantasia, PA-C   BP 140/86 mmHg  Pulse 71  Temp(Src) 98 F (36.7 C) (Oral)  Resp 16  Ht  (1.676 m)  Wt 160 lb (72.576 kg)  BMI 25.84 kg/m2  SpO2 98%  LMP 04/19/2014 (Approximate) Physical Exam  Constitutional: She is oriented to person, place, and time. She appears well-developed and well-nourished. No distress.  HENT:  Head: Normocephalic and atraumatic.  Post oropharyngeal erythema without edema or exudate. Postnasal drip. Nasal congestion, mucosal edema.  Eyes: Conjunctivae and EOM are normal. Pupils are equal, round, and reactive to light.  Neck: Normal range of motion. Neck supple. No JVD present.  Cardiovascular: Normal rate, regular rhythm, normal heart sounds and intact distal pulses.   No extremity edema.  Pulmonary/Chest: Effort normal and breath sounds normal. No respiratory distress.  Abdominal: Soft. Bowel sounds are normal. There is no tenderness.  Musculoskeletal: Normal range of motion. She exhibits no edema.  Lymphadenopathy:    She has cervical adenopathy.  Neurological: She is alert and oriented to person, place, and time. She has normal strength. No sensory deficit.  Speech fluent, goal oriented. Moves limbs without ataxia. Equal grip strength bilateral.  Skin: Skin is warm and dry. She is not diaphoretic.  Psychiatric: She has a normal mood and affect. Her behavior is normal.  Nursing note and vitals reviewed.   ED Course  Procedures (including critical care time) Labs Review Labs Reviewed   CBC - Abnormal; Notable for the following:    RBC 5.22 (*)    All other components within normal limits  BASIC METABOLIC PANEL - Abnormal; Notable for the following:    Potassium 3.2 (*)    Glucose, Bld 148 (*)    BUN 2 (*)    All other components within normal limits  MONONUCLEOSIS SCREEN    Imaging Review Dg Chest 2 View  04/28/2014   CLINICAL DATA:  Sore throat and rash  EXAM: CHEST  2 VIEW  COMPARISON:  April 26, 2014  FINDINGS: Lungs are clear. Heart size and pulmonary vascularity are normal. No adenopathy. No bone lesions.  IMPRESSION: No edema or consolidation.   Electronically Signed   By: Bretta Bang M.D.   On: 04/28/2014 20:25   Dg Chest 2 View  04/26/2014   CLINICAL DATA:  Right-sided chest pain with radiation. Body aches. Vomiting yesterday. Nauseous today. No diarrhea.  EXAM: CHEST  2 VIEW  COMPARISON:  07/26/2013  FINDINGS: The heart size and mediastinal contours are within normal limits. Both lungs are clear. The visualized skeletal structures are unremarkable.  IMPRESSION: No active cardiopulmonary disease.   Electronically Signed   By: Elige Ko   On: 04/26/2014 22:58     EKG Interpretation   Date/Time:  Friday April 28 2014 19:59:29 EST Ventricular Rate:  73 PR Interval:  126 QRS Duration: 96 QT Interval:  372 QTC Calculation: 409 R Axis:   81 Text Interpretation:  Normal sinus rhythm with sinus arrhythmia Normal ECG  No significant change was found Confirmed by Manus Gunning  MD, STEPHEN [phone])  on 04/28/2014 8:02:56 PM      MDM   Final diagnoses:  Viral illness  Flu-like symptoms   Patient in no apparent distress. Afebrile, vital signs stable. Recent negative rapid strep and negative chest x-ray. Given worsening symptoms, repeat chest x-ray obtained. No acute findings. This is most likely a flu-like illness. Doubt cardiac or PE. PERC negative. Labs without acute finding. Mono screen negative. Advised her to continue symptomatic treatment,  including the prescriptions given by the ED 2 days ago. States she needs a new note for work, since she tried to go back to work today, however had no energy to do so. She is stable for discharge. Return precautions given. Patient states understanding of treatment care plan and is agreeable.  Kathrynn Speed, PA-C 04/28/14 2151  Glynn Octave, MD 04/29/14 [phone]

## 2014-04-28 NOTE — Discharge Instructions (Signed)
Continue taking the medications prescribed at the emergency department 2 days ago. Follow-up with your primary care physician. Rest and stay well-hydrated.  Viral Infections A viral infection can be caused by different types of viruses.Most viral infections are not serious and resolve on their own. However, some infections may cause severe symptoms and may lead to further complications. SYMPTOMS Viruses can frequently cause:  Minor sore throat.  Aches and pains.  Headaches.  Runny nose.  Different types of rashes.  Watery eyes.  Tiredness.  Cough.  Loss of appetite.  Gastrointestinal infections, resulting in nausea, vomiting, and diarrhea. These symptoms do not respond to antibiotics because the infection is not caused by bacteria. However, you might catch a bacterial infection following the viral infection. This is sometimes called a "superinfection." Symptoms of such a bacterial infection may include:  Worsening sore throat with pus and difficulty swallowing.  Swollen neck glands.  Chills and a high or persistent fever.  Severe headache.  Tenderness over the sinuses.  Persistent overall ill feeling (malaise), muscle aches, and tiredness (fatigue).  Persistent cough.  Yellow, green, or brown mucus production with coughing. HOME CARE INSTRUCTIONS   Only take over-the-counter or prescription medicines for pain, discomfort, diarrhea, or fever as directed by your caregiver.  Drink enough water and fluids to keep your urine clear or pale yellow. Sports drinks can provide valuable electrolytes, sugars, and hydration.  Get plenty of rest and maintain proper nutrition. Soups and broths with crackers or rice are fine. SEEK IMMEDIATE MEDICAL CARE IF:   You have severe headaches, shortness of breath, chest pain, neck pain, or an unusual rash.  You have uncontrolled vomiting, diarrhea, or you are unable to keep down fluids.  You or your child has an oral temperature  above 102 F (38.9 C), not controlled by medicine.  Your baby is older than 3 months with a rectal temperature of 102 F (38.9 C) or higher.  Your baby is 11 months old or younger with a rectal temperature of 100.4 F (38 C) or higher. MAKE SURE YOU:   Understand these instructions.  Will watch your condition.  Will get help right away if you are not doing well or get worse. Document Released: 12/25/2004 Document Revised: 06/09/2011 Document Reviewed: 07/22/2010 [facility] Patient Information 2015 [place], [place]. This information is not intended to replace advice given to you by your health care provider. Make sure you discuss any questions you have with your health care provider.  Influenza Influenza ("the flu") is a viral infection of the respiratory tract. It occurs more often in winter months because people spend more time in close contact with one another. Influenza can make you feel very sick. Influenza easily spreads from person to person (contagious). CAUSES  Influenza is caused by a virus that infects the respiratory tract. You can catch the virus by breathing in droplets from an infected person's cough or sneeze. You can also catch the virus by touching something that was recently contaminated with the virus and then touching your mouth, nose, or eyes. RISKS AND COMPLICATIONS You may be at risk for a more severe case of influenza if you smoke cigarettes, have diabetes, have chronic heart disease (such as heart failure) or lung disease (such as asthma), or if you have a weakened immune system. Elderly people and pregnant women are also at risk for more serious infections. The most common problem of influenza is a lung infection (pneumonia). Sometimes, this problem can require emergency medical care and may be  life threatening. SIGNS AND SYMPTOMS  Symptoms typically last 4 to 10 days and may include:  Fever.  Chills.  Headache, body aches, and muscle aches.  Sore  throat.  Chest discomfort and cough.  Poor appetite.  Weakness or feeling tired.  Dizziness.  Nausea or vomiting. DIAGNOSIS  Diagnosis of influenza is often made based on your history and a physical exam. A nose or throat swab test can be done to confirm the diagnosis. TREATMENT  In mild cases, influenza goes away on its own. Treatment is directed at relieving symptoms. For more severe cases, your health care provider may prescribe antiviral medicines to shorten the sickness. Antibiotic medicines are not effective because the infection is caused by a virus, not by bacteria. HOME CARE INSTRUCTIONS  Take medicines only as directed by your health care provider.  Use a cool mist humidifier to make breathing easier.  Get plenty of rest until your temperature returns to normal. This usually takes 3 to 4 days.  Drink enough fluid to keep your urine clear or pale yellow.  Cover yourmouth and nosewhen coughing or sneezing,and wash your handswellto prevent thevirusfrom spreading.  Stay homefromwork orschool untilthe fever is gonefor at least 871full day. PREVENTION  An annual influenza vaccination (flu shot) is the best way to avoid getting influenza. An annual flu shot is now routinely recommended for all adults in the [place] SEEK MEDICAL CARE IF:  You experiencechest pain, yourcough worsens,or you producemore mucus.  Youhave nausea,vomiting, ordiarrhea.  Your fever returns or gets worse. SEEK IMMEDIATE MEDICAL CARE IF:  You havetrouble breathing, you become short of breath,or your skin ornails becomebluish.  You have severe painor stiffnessin the neck.  You develop a sudden headache, or pain in the face or ear.  You have nausea or vomiting that you cannot control. MAKE SURE YOU:   Understand these instructions.  Will watch your condition.  Will get help right away if you are not doing well or get worse. Document Released: 03/14/2000 Document Revised:  08/01/2013 Document Reviewed: 06/16/2011 [facility] Patient Information 2015 [place]. This information is not intended to replace advice given to you by your health care provider. Make sure you discuss any questions you have with your health care provider.

## 2014-04-28 NOTE — ED Notes (Signed)
C/o ha , cp dizziness, sore throat  Was seen wed for same  States not getting any better, and has rash on chest now

## 2014-04-28 NOTE — ED Notes (Addendum)
Pt reports neck pain, headache, back pain, chest pain, since Tuesday. Pt reports rash to chest. States seen two days ago and no improvement.

## 2014-06-25 ENCOUNTER — Encounter (HOSPITAL_BASED_OUTPATIENT_CLINIC_OR_DEPARTMENT_OTHER): Payer: Self-pay | Admitting: *Deleted

## 2014-06-25 ENCOUNTER — Emergency Department (HOSPITAL_BASED_OUTPATIENT_CLINIC_OR_DEPARTMENT_OTHER)
Admission: EM | Admit: 2014-06-25 | Discharge: 2014-06-25 | Disposition: A | Discharge status: Home / Self Care | Payer: Medicaid Other | Attending: Emergency Medicine | Admitting: Emergency Medicine

## 2014-06-25 ENCOUNTER — Emergency Department (HOSPITAL_BASED_OUTPATIENT_CLINIC_OR_DEPARTMENT_OTHER): Payer: Medicaid Other

## 2014-06-25 DIAGNOSIS — R1031 Right lower quadrant pain: Secondary | ICD-10-CM | POA: Insufficient documentation

## 2014-06-25 DIAGNOSIS — R102 Pelvic and perineal pain: Secondary | ICD-10-CM

## 2014-06-25 DIAGNOSIS — Z9049 Acquired absence of other specified parts of digestive tract: Secondary | ICD-10-CM | POA: Insufficient documentation

## 2014-06-25 DIAGNOSIS — Z87891 Personal history of nicotine dependence: Secondary | ICD-10-CM | POA: Insufficient documentation

## 2014-06-25 DIAGNOSIS — Z880 Allergy status to penicillin: Secondary | ICD-10-CM | POA: Diagnosis not present

## 2014-06-25 DIAGNOSIS — Z3202 Encounter for pregnancy test, result negative: Secondary | ICD-10-CM | POA: Diagnosis not present

## 2014-06-25 LAB — URINALYSIS, ROUTINE W REFLEX MICROSCOPIC
Bilirubin Urine: NEGATIVE
Glucose, UA: NEGATIVE mg/dL
Hgb urine dipstick: NEGATIVE
Ketones, ur: NEGATIVE mg/dL
LEUKOCYTES UA: NEGATIVE
Nitrite: NEGATIVE
Protein, ur: NEGATIVE mg/dL
Specific Gravity, Urine: 1.014 (ref 1.005–1.030)
UROBILINOGEN UA: 2 mg/dL — AB (ref 0.0–1.0)
pH: 7 (ref 5.0–8.0)

## 2014-06-25 LAB — CBC WITH DIFFERENTIAL/PLATELET
Basophils Absolute: 0 10*3/uL (ref 0.0–0.1)
Basophils Relative: 0 % (ref 0–1)
EOS ABS: 0.3 10*3/uL (ref 0.0–0.7)
Eosinophils Relative: 4 % (ref 0–5)
HCT: 43.1 % (ref 36.0–46.0)
Hemoglobin: 15.1 g/dL — ABNORMAL HIGH (ref 12.0–15.0)
Lymphocytes Relative: 27 % (ref 12–46)
Lymphs Abs: 1.9 10*3/uL (ref 0.7–4.0)
MCH: 28.6 pg (ref 26.0–34.0)
MCHC: 35 g/dL (ref 30.0–36.0)
MCV: 81.6 fL (ref 78.0–100.0)
Monocytes Absolute: 0.7 10*3/uL (ref 0.1–1.0)
Monocytes Relative: 10 % (ref 3–12)
NEUTROS ABS: 4.3 10*3/uL (ref 1.7–7.7)
NEUTROS PCT: 59 % (ref 43–77)
Platelets: 227 10*3/uL (ref 150–400)
RBC: 5.28 MIL/uL — ABNORMAL HIGH (ref 3.87–5.11)
RDW: 12.6 % (ref 11.5–15.5)
WBC: 7.2 10*3/uL (ref 4.0–10.5)

## 2014-06-25 LAB — COMPREHENSIVE METABOLIC PANEL
ALBUMIN: 4 g/dL (ref 3.5–5.2)
ALT: 15 U/L (ref 0–35)
AST: 21 U/L (ref 0–37)
Alkaline Phosphatase: 52 U/L (ref 39–117)
Anion gap: 8 (ref 5–15)
BILIRUBIN TOTAL: 2 mg/dL — AB (ref 0.3–1.2)
BUN: 5 mg/dL — AB (ref 6–23)
CO2: 29 mmol/L (ref 19–32)
Calcium: 9.4 mg/dL (ref 8.4–10.5)
Chloride: 100 mmol/L (ref 96–112)
Creatinine, Ser: 0.98 mg/dL (ref 0.50–1.10)
GFR calc non Af Amer: 77 mL/min — ABNORMAL LOW (ref >90)
GFR, EST AFRICAN AMERICAN: 89 mL/min — AB (ref >90)
GLUCOSE: 104 mg/dL — AB (ref 70–99)
POTASSIUM: 4.1 mmol/L (ref 3.5–5.1)
SODIUM: 137 mmol/L (ref 135–145)
Total Protein: 7.5 g/dL (ref 6.0–8.3)

## 2014-06-25 LAB — PREGNANCY, URINE: Preg Test, Ur: NEGATIVE

## 2014-06-25 LAB — WET PREP, GENITAL
CLUE CELLS WET PREP: NONE SEEN
Trich, Wet Prep: NONE SEEN
WBC WET PREP: NONE SEEN
Yeast Wet Prep HPF POC: NONE SEEN

## 2014-06-25 LAB — LIPASE, BLOOD: LIPASE: 31 U/L (ref 11–59)

## 2014-06-25 MED ORDER — MORPHINE SULFATE 4 MG/ML IJ SOLN
4.0000 mg | Freq: Once | INTRAMUSCULAR | Status: AC
  Administered 2014-06-25: 4 mg via INTRAVENOUS
  Filled 2014-06-25: qty 1

## 2014-06-25 MED ORDER — ONDANSETRON HCL 4 MG/2ML IJ SOLN
4.0000 mg | Freq: Once | INTRAMUSCULAR | Status: AC
  Administered 2014-06-25: 4 mg via INTRAVENOUS
  Filled 2014-06-25: qty 2

## 2014-06-25 MED ORDER — HYDROCODONE-ACETAMINOPHEN 5-325 MG PO TABS: ORAL_TABLET | ORAL | Status: DC

## 2014-06-25 NOTE — ED Provider Notes (Signed)
CSN: [id number]     Arrival date & time 06/25/14  1358 History   First MD Initiated Contact with Patient 06/25/14 1503     Chief Complaint  Patient presents with  . Abdominal Pain     (Consider location/radiation/quality/duration/timing/severity/associated sxs/prior Treatment) HPI   Abigail James is a 29 y.o. female complaining of severe, sharp, constant, right lower quadrant abdominal pain onset 2 days ago associated with slightly reduced by mouth intake. She has taken Motrin at home with little relief. Patient reports she had an abnormally light. On March 14. Patient denies fever, chills, nausea, vomiting, dysuria, hematuria, change in bowel habits, abnormal vaginal discharge. States that she has had both her appendix and her gallbladder removed. States that she has had a pain like this in the past at ovulation but has never been this severe. Patient is actively trying to become pregnant.  History reviewed. No pertinent past medical history. Past Surgical History  Procedure Laterality Date  . Appendectomy    . Cholecystectomy     No family history on file. History  Substance Use Topics  . Smoking status: Former Smoker    Quit date: 04/08/2014  . Smokeless tobacco: Never Used  . Alcohol Use: Yes     Comment: occasional   OB History    No data available     Review of Systems  10 systems reviewed and found to be negative, except as noted in the HPI.   Allergies  Amoxicillin; Penicillins; and Sulfa antibiotics  Home Medications   Prior to Admission medications   Medication Sig Start Date End Date Taking? Authorizing Provider  Prenatal Vit-Fe Fumarate-FA (MULTIVITAMIN-PRENATAL) 27-0.8 MG TABS tablet Take 1 tablet by mouth daily at 12 noon.   Yes Historical Provider, MD  HYDROcodone-acetaminophen (NORCO/VICODIN) 5-325 MG per tablet Take 1-2 tablets by mouth every 6 hours as needed for pain and/or cough. 06/25/14   Graceanna Theissen, PA-C   BP 114/75 mmHg  Pulse 80   Temp(Src) 98 F (36.7 C) (Oral)  Resp 18  Ht 5\' 6"  (1.676 m)  Wt 160 lb (72.576 kg)  BMI 25.84 kg/m2  SpO2 95%  LMP 05/21/2014 (Approximate) Physical Exam  Constitutional: She is oriented to person, place, and time. She appears well-developed and well-nourished. No distress.  HENT:  Head: Normocephalic.  Eyes: Conjunctivae and EOM are normal.  Cardiovascular: Normal rate and regular rhythm.   Pulmonary/Chest: Effort normal. No stridor.  Abdominal: Soft. Bowel sounds are normal. She exhibits no distension and no mass. There is tenderness. There is no rebound and no guarding.  Mild TTP in the right lower quadrant with no guarding or rebound.  Genitourinary:  Pelvic exam a chaperoned by technician: No rashes or lesions, there is a scant, white vaginal discharge that is non-foul-smelling. No cervical motion or adnexal tenderness.  Musculoskeletal: Normal range of motion.  Neurological: She is alert and oriented to person, place, and time.  Psychiatric: She has a normal mood and affect.  Nursing note and vitals reviewed.   ED Course  Procedures (including critical care time) Labs Review Labs Reviewed  URINALYSIS, ROUTINE W REFLEX MICROSCOPIC - Abnormal; Notable for the following:    Urobilinogen, UA 2.0 (*)    All other components within normal limits  CBC WITH DIFFERENTIAL/PLATELET - Abnormal; Notable for the following:    RBC 5.28 (*)    Hemoglobin 15.1 (*)    All other components within normal limits  COMPREHENSIVE METABOLIC PANEL - Abnormal; Notable for the following:  Glucose, Bld 104 (*)    BUN 5 (*)    Total Bilirubin 2.0 (*)    GFR calc non Af Amer 77 (*)    GFR calc Af Amer 89 (*)    All other components within normal limits  WET PREP, GENITAL  PREGNANCY, URINE  LIPASE, BLOOD  RPR  HIV ANTIBODY (ROUTINE TESTING)  GC/CHLAMYDIA PROBE AMP ([place])    Imaging Review US Transvaginal Non-ob  06/25/2014   CLINICAL DATA:  Right lower quadrant pain for 2 days,  intermittent nausea  EXAM: TRANSABDOMINAL AND TRANSVAGINAL ULTRASOUND OF PELVIS  DOPPLER ULTRASOUND OF OVARIES  TECHNIQUE: Both transabdominal and transvaginal ultrasound examinations of the pelvis were performed. Transabdominal technique was performed for global imaging of the pelvis including uterus, ovaries, adnexal regions, and pelvic cul-de-sac.  It was necessary to proceed with endovaginal exam following the transabdominal exam to visualize the uterus and ovaries. Color and duplex Doppler ultrasound was utilized to evaluate blood flow to the ovaries.  COMPARISON:  None.  FINDINGS: Uterus  Measurements: 7.3 x 4 x 5.1 cm. No fibroids or other mass visualized. The uterus is retroflexed  Endometrium  Thickness: 5 mm in diameter within normal limits. No focal abnormality visualized.  Right ovary  Measurements: 4 x 1.8 x 2.2 cm. Normal appearance/no adnexal mass.  Left ovary  Measurements: 3.4 x 1.8 x 2.8 cm. Normal appearance/no adnexal mass.  Pulsed Doppler evaluation of both ovaries demonstrates normal low-resistance arterial and venous waveforms.  Other findings  Trace pelvic free fluid is noted.  IMPRESSION: 1. Unremarkable uterus and ovaries.  Retroflexed uterus 2. Bilateral normal ovarian flow. 3. Trace pelvic free fluid.   Electronically Signed   By: Natasha Mead M.D.   On: 06/25/2014 16:04   US Pelvis Complete  06/25/2014   CLINICAL DATA:  Right lower quadrant pain for 2 days, intermittent nausea  EXAM: TRANSABDOMINAL AND TRANSVAGINAL ULTRASOUND OF PELVIS  DOPPLER ULTRASOUND OF OVARIES  TECHNIQUE: Both transabdominal and transvaginal ultrasound examinations of the pelvis were performed. Transabdominal technique was performed for global imaging of the pelvis including uterus, ovaries, adnexal regions, and pelvic cul-de-sac.  It was necessary to proceed with endovaginal exam following the transabdominal exam to visualize the uterus and ovaries. Color and duplex Doppler ultrasound was utilized to evaluate  blood flow to the ovaries.  COMPARISON:  None.  FINDINGS: Uterus  Measurements: 7.3 x 4 x 5.1 cm. No fibroids or other mass visualized. The uterus is retroflexed  Endometrium  Thickness: 5 mm in diameter within normal limits. No focal abnormality visualized.  Right ovary  Measurements: 4 x 1.8 x 2.2 cm. Normal appearance/no adnexal mass.  Left ovary  Measurements: 3.4 x 1.8 x 2.8 cm. Normal appearance/no adnexal mass.  Pulsed Doppler evaluation of both ovaries demonstrates normal low-resistance arterial and venous waveforms.  Other findings  Trace pelvic free fluid is noted.  IMPRESSION: 1. Unremarkable uterus and ovaries.  Retroflexed uterus 2. Bilateral normal ovarian flow. 3. Trace pelvic free fluid.   Electronically Signed   By: Natasha Mead M.D.   On: 06/25/2014 16:04   [place] Art/ven Flow Abd Pelv Doppler  06/25/2014   CLINICAL DATA:  Right lower quadrant pain for 2 days, intermittent nausea  EXAM: TRANSABDOMINAL AND TRANSVAGINAL ULTRASOUND OF PELVIS  DOPPLER ULTRASOUND OF OVARIES  TECHNIQUE: Both transabdominal and transvaginal ultrasound examinations of the pelvis were performed. Transabdominal technique was performed for global imaging of the pelvis including uterus, ovaries, adnexal regions, and pelvic cul-de-sac.  It was  necessary to proceed with endovaginal exam following the transabdominal exam to visualize the uterus and ovaries. Color and duplex Doppler ultrasound was utilized to evaluate blood flow to the ovaries.  COMPARISON:  None.  FINDINGS: Uterus  Measurements: 7.3 x 4 x 5.1 cm. No fibroids or other mass visualized. The uterus is retroflexed  Endometrium  Thickness: 5 mm in diameter within normal limits. No focal abnormality visualized.  Right ovary  Measurements: 4 x 1.8 x 2.2 cm. Normal appearance/no adnexal mass.  Left ovary  Measurements: 3.4 x 1.8 x 2.8 cm. Normal appearance/no adnexal mass.  Pulsed Doppler evaluation of both ovaries demonstrates normal low-resistance arterial and venous  waveforms.  Other findings  Trace pelvic free fluid is noted.  IMPRESSION: 1. Unremarkable uterus and ovaries.  Retroflexed uterus 2. Bilateral normal ovarian flow. 3. Trace pelvic free fluid.   Electronically Signed   By: Natasha Mead M.D.   On: 06/25/2014 16:04     EKG Interpretation None      MDM   Final diagnoses:  RLQ abdominal pain    Filed Vitals:   06/25/14 1405 06/25/14 1620  BP: 132/72 114/75  Pulse: 72 80  Temp: 98.3 F (36.8 C) 98 F (36.7 C)  TempSrc: Oral Oral  Resp: 18 18  Height:  (1.676 m)   Weight: 160 lb (72.576 kg)   SpO2: 99% 95%    Medications  morphine 4 MG/ML injection 4 mg (4 mg Intravenous Given 06/25/14 1556)  ondansetron (ZOFRAN) injection 4 mg (4 mg Intravenous Given 06/25/14 1552)    Honore Wipperfurth is a pleasant 29 y.o. female presenting with severe right lower quadrant abdominal pain. Patient is status post appendectomy and cholecystectomy. Abdominal exam with tenderness, no guarding or rebound. Concern for ovarian torsion. Patient has very mild tenderness on the right side during bimanual exam. Pelvic is otherwise unremarkable, blood work unremarkable, urine pregnancy is unremarkable, pelvic ultrasound with normal blood flow and no abnormalities in the uterus and ovaries. Patient will be given pain medication and advised to follow closely with both primary care and OB/GYN.  Evaluation does not show pathology that would require ongoing emergent intervention or inpatient treatment. Pt is hemodynamically stable and mentating appropriately. Discussed findings and plan with patient/guardian, who agrees with care plan. All questions answered. Return precautions discussed and outpatient follow up given.   New Prescriptions   HYDROCODONE-ACETAMINOPHEN (NORCO/VICODIN) 5-325 MG PER TABLET    Take 1-2 tablets by mouth every 6 hours as needed for pain and/or cough.         Wynetta Emery, PA-C 06/25/14 1717  Purvis Sheffield, MD 06/25/14 [phone]

## 2014-06-25 NOTE — ED Notes (Addendum)
RLQ pain x 2 days- denies dysuria, denies vaginal d/c. Reports "spotty period" in March. Last regular period in Feb- pt states she is trying to get pregnant at this time

## 2014-06-25 NOTE — Discharge Instructions (Signed)
Take vicodin for breakthrough pain, do not drink alcohol, drive, care for children or do other critical tasks while taking vicodin.  Please follow with your primary care doctor in the next 2 days for a check-up. They must obtain records for further management.   Do not hesitate to return to the Emergency Department for any new, worsening or concerning symptoms.    Abdominal Pain, Women Abdominal (stomach, pelvic, or belly) pain can be caused by many things. It is important to tell your doctor:  The location of the pain.  Does it come and go or is it present all the time?  Are there things that start the pain (eating certain foods, exercise)?  Are there other symptoms associated with the pain (fever, nausea, vomiting, diarrhea)? All of this is helpful to know when trying to find the cause of the pain. CAUSES   Stomach: virus or bacteria infection, or ulcer.  Intestine: appendicitis (inflamed appendix), regional ileitis (Crohn's disease), ulcerative colitis (inflamed colon), irritable bowel syndrome, diverticulitis (inflamed diverticulum of the colon), or cancer of the stomach or intestine.  Gallbladder disease or stones in the gallbladder.  Kidney disease, kidney stones, or infection.  Pancreas infection or cancer.  Fibromyalgia (pain disorder).  Diseases of the female organs:  Uterus: fibroid (non-cancerous) tumors or infection.  Fallopian tubes: infection or tubal pregnancy.  Ovary: cysts or tumors.  Pelvic adhesions (scar tissue).  Endometriosis (uterus lining tissue growing in the pelvis and on the pelvic organs).  Pelvic congestion syndrome (female organs filling up with blood just before the menstrual period).  Pain with the menstrual period.  Pain with ovulation (producing an egg).  Pain with an IUD (intrauterine device, birth control) in the uterus.  Cancer of the female organs.  Functional pain (pain not caused by a disease, may improve without  treatment).  Psychological pain.  Depression. DIAGNOSIS  Your doctor will decide the seriousness of your pain by doing an examination.  Blood tests.  X-rays.  Ultrasound.  CT scan (computed tomography, special type of X-ray).  MRI (magnetic resonance imaging).  Cultures, for infection.  Barium enema (dye inserted in the large intestine, to better view it with X-rays).  Colonoscopy (looking in intestine with a lighted tube).  Laparoscopy (minor surgery, looking in abdomen with a lighted tube).  Major abdominal exploratory surgery (looking in abdomen with a large incision). TREATMENT  The treatment will depend on the cause of the pain.   Many cases can be observed and treated at home.  Over-the-counter medicines recommended by your caregiver.  Prescription medicine.  Antibiotics, for infection.  Birth control pills, for painful periods or for ovulation pain.  Hormone treatment, for endometriosis.  Nerve blocking injections.  Physical therapy.  Antidepressants.  Counseling with a psychologist or psychiatrist.  Minor or major surgery. HOME CARE INSTRUCTIONS   Do not take laxatives, unless directed by your caregiver.  Take over-the-counter pain medicine only if ordered by your caregiver. Do not take aspirin because it can cause an upset stomach or bleeding.  Try a clear liquid diet (broth or water) as ordered by your caregiver. Slowly move to a bland diet, as tolerated, if the pain is related to the stomach or intestine.  Have a thermometer and take your temperature several times a day, and record it.  Bed rest and sleep, if it helps the pain.  Avoid sexual intercourse, if it causes pain.  Avoid stressful situations.  Keep your follow-up appointments and tests, as your caregiver orders.  If  the pain does not go away with medicine or surgery, you may try:  Acupuncture.  Relaxation exercises (yoga, meditation).  Group therapy.  Counseling. SEEK  MEDICAL CARE IF:   You notice certain foods cause stomach pain.  Your home care treatment is not helping your pain.  You need stronger pain medicine.  You want your IUD removed.  You feel faint or lightheaded.  You develop nausea and vomiting.  You develop a rash.  You are having side effects or an allergy to your medicine. SEEK IMMEDIATE MEDICAL CARE IF:   Your pain does not go away or gets worse.  You have a fever.  Your pain is felt only in portions of the abdomen. The right side could possibly be appendicitis. The left lower portion of the abdomen could be colitis or diverticulitis.  You are passing blood in your stools (bright red or black tarry stools, with or without vomiting).  You have blood in your urine.  You develop chills, with or without a fever.  You pass out. MAKE SURE YOU:   Understand these instructions.  Will watch your condition.  Will get help right away if you are not doing well or get worse. Document Released: 01/12/2007 Document Revised: 08/01/2013 Document Reviewed: 02/01/2009 [facility] Patient Information 2015 [place], [place]. This information is not intended to replace advice given to you by your health care provider. Make sure you discuss any questions you have with your health care provider.

## 2014-06-26 ENCOUNTER — Encounter (HOSPITAL_BASED_OUTPATIENT_CLINIC_OR_DEPARTMENT_OTHER): Payer: Self-pay

## 2014-06-26 LAB — GC/CHLAMYDIA PROBE AMP (~~LOC~~) NOT AT ARMC
Chlamydia: NEGATIVE
Neisseria Gonorrhea: NEGATIVE

## 2014-06-27 LAB — RPR: RPR: NONREACTIVE

## 2014-06-27 LAB — HIV ANTIBODY (ROUTINE TESTING W REFLEX): HIV SCREEN 4TH GENERATION: NONREACTIVE

## 2016-04-28 ENCOUNTER — Other Ambulatory Visit: Payer: Self-pay | Admitting: Obstetrics and Gynecology

## 2016-04-29 LAB — CYTOLOGY - PAP

## 2016-05-06 ENCOUNTER — Inpatient Hospital Stay (HOSPITAL_COMMUNITY)
Admission: AD | Admit: 2016-05-06 | Discharge: 2016-05-06 | Disposition: A | Discharge status: Home / Self Care | Payer: Medicaid Other | Source: Ambulatory Visit | Attending: Obstetrics and Gynecology | Admitting: Obstetrics and Gynecology

## 2016-05-06 ENCOUNTER — Inpatient Hospital Stay (HOSPITAL_COMMUNITY): Payer: Medicaid Other

## 2016-05-06 ENCOUNTER — Encounter (HOSPITAL_COMMUNITY): Payer: Self-pay | Admitting: *Deleted

## 2016-05-06 DIAGNOSIS — O4690 Antepartum hemorrhage, unspecified, unspecified trimester: Secondary | ICD-10-CM

## 2016-05-06 DIAGNOSIS — O09899 Supervision of other high risk pregnancies, unspecified trimester: Secondary | ICD-10-CM

## 2016-05-06 DIAGNOSIS — O289 Unspecified abnormal findings on antenatal screening of mother: Secondary | ICD-10-CM

## 2016-05-06 DIAGNOSIS — O281 Abnormal biochemical finding on antenatal screening of mother: Secondary | ICD-10-CM

## 2016-05-06 DIAGNOSIS — O26899 Other specified pregnancy related conditions, unspecified trimester: Secondary | ICD-10-CM

## 2016-05-06 DIAGNOSIS — O26891 Other specified pregnancy related conditions, first trimester: Secondary | ICD-10-CM | POA: Insufficient documentation

## 2016-05-06 DIAGNOSIS — O09891 Supervision of other high risk pregnancies, first trimester: Secondary | ICD-10-CM | POA: Diagnosis not present

## 2016-05-06 DIAGNOSIS — N939 Abnormal uterine and vaginal bleeding, unspecified: Secondary | ICD-10-CM | POA: Diagnosis present

## 2016-05-06 DIAGNOSIS — O209 Hemorrhage in early pregnancy, unspecified: Secondary | ICD-10-CM | POA: Insufficient documentation

## 2016-05-06 DIAGNOSIS — Z91040 Latex allergy status: Secondary | ICD-10-CM | POA: Insufficient documentation

## 2016-05-06 DIAGNOSIS — R109 Unspecified abdominal pain: Secondary | ICD-10-CM

## 2016-05-06 DIAGNOSIS — O4691 Antepartum hemorrhage, unspecified, first trimester: Secondary | ICD-10-CM | POA: Diagnosis not present

## 2016-05-06 DIAGNOSIS — Z881 Allergy status to other antibiotic agents status: Secondary | ICD-10-CM | POA: Insufficient documentation

## 2016-05-06 DIAGNOSIS — Z87891 Personal history of nicotine dependence: Secondary | ICD-10-CM | POA: Insufficient documentation

## 2016-05-06 DIAGNOSIS — Z880 Allergy status to penicillin: Secondary | ICD-10-CM | POA: Diagnosis not present

## 2016-05-06 DIAGNOSIS — Z3A01 Less than 8 weeks gestation of pregnancy: Secondary | ICD-10-CM | POA: Diagnosis not present

## 2016-05-06 LAB — URINALYSIS, ROUTINE W REFLEX MICROSCOPIC
Bacteria, UA: NONE SEEN
Bilirubin Urine: NEGATIVE
Glucose, UA: NEGATIVE mg/dL
KETONES UR: NEGATIVE mg/dL
LEUKOCYTES UA: NEGATIVE
Nitrite: NEGATIVE
Protein, ur: NEGATIVE mg/dL
Specific Gravity, Urine: 1.002 — ABNORMAL LOW (ref 1.005–1.030)
pH: 7 (ref 5.0–8.0)

## 2016-05-06 LAB — CBC
HEMATOCRIT: 41.6 % (ref 36.0–46.0)
Hemoglobin: 14.4 g/dL (ref 12.0–15.0)
MCH: 28 pg (ref 26.0–34.0)
MCHC: 34.6 g/dL (ref 30.0–36.0)
MCV: 80.9 fL (ref 78.0–100.0)
Platelets: 229 10*3/uL (ref 150–400)
RBC: 5.14 MIL/uL — ABNORMAL HIGH (ref 3.87–5.11)
RDW: 12.7 % (ref 11.5–15.5)
WBC: 11.3 10*3/uL — AB (ref 4.0–10.5)

## 2016-05-06 LAB — POCT PREGNANCY, URINE: Preg Test, Ur: POSITIVE — AB

## 2016-05-06 LAB — HCG, QUANTITATIVE, PREGNANCY: HCG, BETA CHAIN, QUANT, S: 146175 m[IU]/mL — AB (ref <5)

## 2016-05-06 NOTE — Discharge Instructions (Signed)
Follow-up in 2 days (05/08/16) [facility] on the ground floor for repeat blood work at 11:00 am. Please allow two hours for blood work to be run and provider to reviewed results.   Vaginal Bleeding During Pregnancy, First Trimester A small amount of bleeding (spotting) from the vagina is relatively common in early pregnancy. It usually stops on its own. Various things may cause bleeding or spotting in early pregnancy. Some bleeding may be related to the pregnancy, and some may not. In most cases, the bleeding is normal and is not a problem. However, bleeding can also be a sign of something serious. Be sure to tell your health care provider about any vaginal bleeding right away. Some possible causes of vaginal bleeding during the first trimester include:  Infection or inflammation of the cervix.  Growths (polyps) on the cervix.  Miscarriage or threatened miscarriage.  Pregnancy tissue has developed outside of the uterus and in a fallopian tube (tubal pregnancy).  Tiny cysts have developed in the uterus instead of pregnancy tissue (molar pregnancy). Follow these instructions at home: Watch your condition for any changes. The following actions may help to lessen any discomfort you are feeling:  Follow your health care provider's instructions for limiting your activity. If your health care provider orders bed rest, you may need to stay in bed and only get up to use the bathroom. However, your health care provider may allow you to continue light activity.  If needed, make plans for someone to help with your regular activities and responsibilities while you are on bed rest.  Keep track of the number of pads you use each day, how often you change pads, and how soaked (saturated) they are. Write this down.  Do not use tampons. Do not douche.  Do not have sexual intercourse or orgasms until approved by your health care provider.  If you pass any tissue from  your vagina, save the tissue so you can show it to your health care provider.  Only take over-the-counter or prescription medicines as directed by your health care provider.  Do not take aspirin because it can make you bleed.  Keep all follow-up appointments as directed by your health care provider. Contact a health care provider if:  You have any vaginal bleeding during any part of your pregnancy.  You have cramps or labor pains.  You have a fever, not controlled by medicine. Get help right away if:  You have severe cramps in your back or belly (abdomen).  You pass large clots or tissue from your vagina.  Your bleeding increases.  You feel light-headed or weak, or you have fainting episodes.  You have chills.  You are leaking fluid or have a gush of fluid from your vagina.  You pass out while having a bowel movement. This information is not intended to replace advice given to you by your health care provider. Make sure you discuss any questions you have with your health care provider. Document Released: 12/25/2004 Document Revised: 08/23/2015 Document Reviewed: 11/22/2012 [employer] Interactive Patient Education  2017 [employer]

## 2016-05-06 NOTE — MAU Provider Note (Signed)
Chief Complaint: Vaginal Bleeding; Abdominal Pain; and Back Pain   SUBJECTIVE HPI: Abigail James is a 31 y.o. [id number] at 6173w0d by LMP who presents to Maternity Admissions reporting abdominal pain and VB.   Found out she was pregnant with UPT+ at home about 3 weeks ago, [facility], US in office showed +GS, but no YS or pole? Patient's last menstrual period was 03/25/2016. Was told may be early pregnancy and therefore cannot see FP yet. Started having severe pain about 5 days ago, then started having VB 4 days ago, both have been worsening daily. This AM was spotting, then this evening had bright red blood on toilet paper that was soaked without tissue or clots, but no blood when arrived in MAU. Nothing helps her pain feel better, nothing makes it worse. Pain located on lower abdomen/pelvis, bilaterally equally. +N/V. Denies F/C.   Last ate food at 2pm, last drank fluids at 4pm  No past medical history on file. OB History  Gravida Para Term Preterm AB Living  4 2 1 1 1 2   SAB TAB Ectopic Multiple Live Births  1 0 0 0 2    # Outcome Date GA Lbr Len/2nd Weight Sex Delivery Anes PTL Lv  4 Current           3 SAB  821w0d         2 Preterm      Vag-Spont   LIV  1 Term      Vag-Spont   LIV     Past Surgical History:  Procedure Laterality Date  . APPENDECTOMY    . CHOLECYSTECTOMY    . DILATION AND CURETTAGE OF UTERUS    . FOOT SURGERY    . LEG SURGERY    . TYMPANOSTOMY TUBE PLACEMENT     Social History   Social History  . Marital status: Single    Spouse name: N/A  . Number of children: N/A  . Years of education: N/A   Occupational History  . Not on file.   Social History Main Topics  . Smoking status: Former Smoker    Quit date: 04/08/2014  . Smokeless tobacco: Never Used  . Alcohol use Yes     Comment: occasional  . Drug use: No  . Sexual activity: Yes    Birth control/ protection: None   Other Topics Concern  . Not on file   Social History  Narrative   ** Merged History Encounter **       No current facility-administered medications on file prior to encounter.    No current outpatient prescriptions on file prior to encounter.   Allergies  Allergen Reactions  . Ciprofloxacin Anaphylaxis  . Latex Swelling  . Clindamycin/Lincomycin     ? Reaction, - dizzy, facial tingling, nausea, severe chest pain  . Vancomycin Nausea And Vomiting  . Amoxicillin Rash  . Penicillins Rash    Has patient had a PCN reaction causing immediate rash, facial/tongue/throat swelling, SOB or lightheadedness with hypotension: yes Has patient had a PCN reaction causing severe rash involving mucus membranes or skin necrosis:no Has patient had a PCN reaction that required hospitalization: unknown  Has patient had a PCN reaction occurring within the last 10 years: no If all of the above answers are "NO", then may proceed with Cephalosporin use.   . Sulfa Antibiotics Swelling and Rash    I have reviewed the past Medical Hx, Surgical Hx, Social Hx, Allergies and Medications.  REVIEW OF SYSTEMS  A comprehensive ROS was negative except per HPI.    OBJECTIVE Patient Vitals for the past 24 hrs:  BP Temp Temp src Pulse Resp Height Weight  05/06/16 1757 127/77 98.9 F (37.2 C) Oral 87 18 5\' 6"  (1.676 m) 145 lb 12 oz (66.1 kg)    PHYSICAL EXAM Constitutional: Well-developed, well-nourished female in no acute distress.  Cardiovascular: normal rate, rhythm, no murmurs. Respiratory: normal rate and effort. CTAB GI: Abd soft, non-tender, non-distended. Pos BS x 4 MS: Extremities nontender, no edema, normal ROM Neurologic: Alert and oriented x 4.  GU: Neg CVAT. SPECULUM EXAM: NEFG, physiologic discharge with some old blood noted on cervix. BIMANUAL: cervix closed; uterus normal size, no adnexal tenderness or masses. No CMT.  LAB RESULTS Results for orders placed or performed during the hospital encounter of 05/06/16 (from the past 24 hour(s))   Urinalysis, Routine w reflex microscopic     Status: Abnormal   Collection Time: 05/06/16  6:05 PM  Result Value Ref Range   Color, Urine STRAW (A) YELLOW   APPearance CLEAR CLEAR   Specific Gravity, Urine 1.002 (L) 1.005 - 1.030   pH 7.0 5.0 - 8.0   Glucose, UA NEGATIVE NEGATIVE mg/dL   Hgb urine dipstick SMALL (A) NEGATIVE   Bilirubin Urine NEGATIVE NEGATIVE   Ketones, ur NEGATIVE NEGATIVE mg/dL   Protein, ur NEGATIVE NEGATIVE mg/dL   Nitrite NEGATIVE NEGATIVE   Leukocytes, UA NEGATIVE NEGATIVE   RBC / HPF 0-5 0 - 5 RBC/hpf   WBC, UA 0-5 0 - 5 WBC/hpf   Bacteria, UA NONE SEEN NONE SEEN   Squamous Epithelial / LPF 0-5 (A) NONE SEEN  Pregnancy, urine POC     Status: Abnormal   Collection Time: 05/06/16  6:06 PM  Result Value Ref Range   Preg Test, Ur POSITIVE (A) NEGATIVE  CBC     Status: Abnormal   Collection Time: 05/06/16  7:17 PM  Result Value Ref Range   WBC 11.3 (H) 4.0 - 10.5 K/uL   RBC 5.14 (H) 3.87 - 5.11 MIL/uL   Hemoglobin 14.4 12.0 - 15.0 g/dL   HCT 54.0 98.1 - 19.1 %   MCV 80.9 78.0 - 100.0 fL   MCH 28.0 26.0 - 34.0 pg   MCHC 34.6 30.0 - 36.0 g/dL   RDW 47.8 29.5 - 62.1 %   Platelets 229 150 - 400 K/uL  hCG, quantitative, pregnancy     Status: Abnormal   Collection Time: 05/06/16  7:17 PM  Result Value Ref Range   hCG, Beta Chain, Quant, S 146,175 (H) <5 mIU/mL  ABO/Rh     Status: None (Preliminary result)   Collection Time: 05/06/16  7:17 PM  Result Value Ref Range   ABO/RH(D) O POS     IMAGING No results found.  MAU COURSE CBC/ABO/BHCG TVUS/Abd [place] SSE SVE  MDM Plan of care reviewed with patient, including labs and tests ordered and medical treatment.   ASSESSMENT/PLAN:  9:49 PM - Patient handed off to [place], CNM who will resume care. Awaiting formal US reading.    Jen Mow, DO OB Fellow 05/06/2016 9:49 PM   Dorathy Kinsman, CNM assumed care of pt. Discussed [place], abnormally high hCG w/ Dr. Dareen Piano. Radiologist describes  Echogenic mass within the amnion as possibly representing sub amniotic hematoma. When asked if it could be a partial mole considering hCG 146 K Radiologist staed that it did not have the cystic appearance characteristic of a partial mole.  Dr. Dareen Piano discussed these findings with the patient. F/U quant in 2 days 05/08/16 and F/U w/ Dr. Dareen Piano in office 05/12/16.   US Ob Comp Less 14 Wks  Addendum Date: 05/06/2016   ADDENDUM REPORT: 05/06/2016 22:46 ADDENDUM: Correction to impression 2. Echogenic mass within the amnion may represent sub amniotic hematoma. Electronically Signed   By: Jasmine Pang M.D.   On: 05/06/2016 22:46   Result Date: 05/06/2016 CLINICAL DATA:  Vaginal bleeding EXAM: OBSTETRIC <14 WK [place] AND TRANSVAGINAL OB US TECHNIQUE: Both transabdominal and transvaginal ultrasound examinations were performed for complete evaluation of the gestation as well as the maternal uterus, adnexal regions, and pelvic cul-de-sac. Transvaginal technique was performed to assess early pregnancy. COMPARISON:  None. FINDINGS: Intrauterine gestational sac: Single intrauterine gestation Yolk sac:  Visualized Embryo:  Visualized Cardiac Activity: Visualized Heart Rate: 124  bpm CRL:  7.5  mm   6 w   4 d                  [place] EDC: 12/26/2016 Subchorionic hemorrhage:  None visualized. Maternal uterus/adnexae: Bilateral ovaries are within normal limits. The right ovary measures 4.3 x 2.7 by 2.6 cm. There is a 2.4 x 2.2 x 2.6 cm corpus luteal cyst in the right ovary. The left ovary measures 2.1 by 3.1 x 1.6 cm. There is an echogenic mass present within the amniotic sac adjacent to the fetal pole. This measures 2.2 x 0.9 x 1.7 cm IMPRESSION: 1. Single intrauterine gestation with fetal cardiac activity as above 2. 2.2 cm echogenic mass within the amniotic sac, possibly representing an intra amniotic hematoma. Electronically Signed: By: Jasmine Pang M.D. On: 05/06/2016 21:55   US Ob Transvaginal  Addendum Date: 05/06/2016    ADDENDUM REPORT: 05/06/2016 22:46 ADDENDUM: Correction to impression 2. Echogenic mass within the amnion may represent sub amniotic hematoma. Electronically Signed   By: Jasmine Pang M.D.   On: 05/06/2016 22:46   Result Date: 05/06/2016 CLINICAL DATA:  Vaginal bleeding EXAM: OBSTETRIC <14 WK [place] AND TRANSVAGINAL OB US TECHNIQUE: Both transabdominal and transvaginal ultrasound examinations were performed for complete evaluation of the gestation as well as the maternal uterus, adnexal regions, and pelvic cul-de-sac. Transvaginal technique was performed to assess early pregnancy. COMPARISON:  None. FINDINGS: Intrauterine gestational sac: Single intrauterine gestation Yolk sac:  Visualized Embryo:  Visualized Cardiac Activity: Visualized Heart Rate: 124  bpm CRL:  7.5  mm   6 w   4 d                  [place] EDC: 12/26/2016 Subchorionic hemorrhage:  None visualized. Maternal uterus/adnexae: Bilateral ovaries are within normal limits. The right ovary measures 4.3 x 2.7 by 2.6 cm. There is a 2.4 x 2.2 x 2.6 cm corpus luteal cyst in the right ovary. The left ovary measures 2.1 by 3.1 x 1.6 cm. There is an echogenic mass present within the amniotic sac adjacent to the fetal pole. This measures 2.2 x 0.9 x 1.7 cm IMPRESSION: 1. Single intrauterine gestation with fetal cardiac activity as above 2. 2.2 cm echogenic mass within the amniotic sac, possibly representing an intra amniotic hematoma. Electronically Signed: By: Jasmine Pang M.D. On: 05/06/2016 21:55    ASSESSMENT 1. High risk pregnancy with high human chorionic gonadotropin (hCG)   2. Vaginal bleeding in pregnancy   3. Abdominal pain affecting pregnancy   4.      Possible sub amniotic hematoma.   PLAN: Discharge home in stable condition  Per consult w/ Dr. Dareen Piano. F/U quant in 2 days 05/08/16 and F/U w/ Dr. Dareen Piano in office 05/12/16.  Bleeding precautions     Medication List    TAKE these medications   acetaminophen 325 MG tablet Commonly known as:   TYLENOL Take 650 mg by mouth every 6 (six) hours as needed for moderate pain.   LORazepam 0.5 MG tablet Commonly known as:  ATIVAN Take 0.5 mg by mouth 2 (two) times daily as needed for anxiety.       [place], [place] 05/06/2016 11:24 PM

## 2016-05-06 NOTE — MAU Note (Signed)
Urine sent to lab 

## 2016-05-06 NOTE — MAU Note (Signed)
Is bleeding, bright red. Has been off and on last few days.  Today it is a lot.  Pain in lower abd and back, is getting , takes her breath away.  Started as spotting. Has been throwing up a lot. Had an US in office last wk, could not see anything, scheduled for repeat next Monday

## 2016-05-07 LAB — ABO/RH: ABO/RH(D): O POS

## 2016-05-08 ENCOUNTER — Ambulatory Visit: Payer: Medicaid Other | Admitting: General Practice

## 2016-05-08 DIAGNOSIS — O09899 Supervision of other high risk pregnancies, unspecified trimester: Secondary | ICD-10-CM

## 2016-05-08 DIAGNOSIS — O281 Abnormal biochemical finding on antenatal screening of mother: Secondary | ICD-10-CM

## 2016-05-08 DIAGNOSIS — O289 Unspecified abnormal findings on antenatal screening of mother: Principal | ICD-10-CM

## 2016-05-08 LAB — HCG, QUANTITATIVE, PREGNANCY: HCG, BETA CHAIN, QUANT, S: 167481 m[IU]/mL — AB (ref <5)

## 2016-05-08 NOTE — Progress Notes (Signed)
Patient here for stat bhcg today. Patient is a patient of Dr Ewell PoeAnderson's. Patient reports continued lower abdominal/pelvic pain & spotting. Patient rates pain at an 8. Informed patient that we will have her wait for results, we will contact her doctor and relay plan of care. Patient verbalized understanding & had no questions

## 2016-05-12 LAB — OB RESULTS CONSOLE ABO/RH: RH TYPE: POSITIVE

## 2016-05-12 LAB — OB RESULTS CONSOLE GC/CHLAMYDIA
Chlamydia: NEGATIVE
GC PROBE AMP, GENITAL: NEGATIVE

## 2016-05-12 LAB — OB RESULTS CONSOLE RUBELLA ANTIBODY, IGM: Rubella: IMMUNE

## 2016-05-12 LAB — OB RESULTS CONSOLE RPR: RPR: NONREACTIVE

## 2016-05-12 LAB — OB RESULTS CONSOLE HEPATITIS B SURFACE ANTIGEN: Hepatitis B Surface Ag: NEGATIVE

## 2016-05-12 LAB — OB RESULTS CONSOLE ANTIBODY SCREEN: ANTIBODY SCREEN: NEGATIVE

## 2016-05-12 LAB — OB RESULTS CONSOLE HIV ANTIBODY (ROUTINE TESTING): HIV: NONREACTIVE

## 2016-05-20 ENCOUNTER — Encounter: Payer: Self-pay | Admitting: Hematology

## 2016-05-20 ENCOUNTER — Telehealth: Payer: Self-pay | Admitting: Hematology

## 2016-05-20 NOTE — Telephone Encounter (Signed)
Pt has been scheduled for the pt to see Dr. Candise CheKale on 2/28 at 2pm. Pt agreed to the appt date and time. Demographics verified. Letter mailed.

## 2016-05-28 ENCOUNTER — Ambulatory Visit (HOSPITAL_BASED_OUTPATIENT_CLINIC_OR_DEPARTMENT_OTHER): Payer: Medicaid Other | Admitting: Hematology

## 2016-05-28 ENCOUNTER — Encounter: Payer: Self-pay | Admitting: Hematology

## 2016-05-28 ENCOUNTER — Telehealth: Payer: Self-pay | Admitting: Hematology

## 2016-05-28 ENCOUNTER — Ambulatory Visit (HOSPITAL_BASED_OUTPATIENT_CLINIC_OR_DEPARTMENT_OTHER): Payer: Medicaid Other

## 2016-05-28 VITALS — BP 138/87 | HR 99 | Temp 98.3°F | Resp 19 | Wt 151.6 lb

## 2016-05-28 DIAGNOSIS — Z87891 Personal history of nicotine dependence: Secondary | ICD-10-CM

## 2016-05-28 DIAGNOSIS — D892 Hypergammaglobulinemia, unspecified: Secondary | ICD-10-CM | POA: Diagnosis not present

## 2016-05-28 DIAGNOSIS — Z3A01 Less than 8 weeks gestation of pregnancy: Secondary | ICD-10-CM

## 2016-05-28 DIAGNOSIS — I82409 Acute embolism and thrombosis of unspecified deep veins of unspecified lower extremity: Secondary | ICD-10-CM

## 2016-05-28 DIAGNOSIS — I2699 Other pulmonary embolism without acute cor pulmonale: Secondary | ICD-10-CM

## 2016-05-28 LAB — CBC & DIFF AND RETIC
BASO%: 0.1 % (ref 0.0–2.0)
BASOS ABS: 0 10*3/uL (ref 0.0–0.1)
EOS ABS: 0.2 10*3/uL (ref 0.0–0.5)
EOS%: 1.7 % (ref 0.0–7.0)
HEMATOCRIT: 39.9 % (ref 34.8–46.6)
HEMOGLOBIN: 14.2 g/dL (ref 11.6–15.9)
IMMATURE RETIC FRACT: 3.8 % (ref 1.60–10.00)
LYMPH%: 15.4 % (ref 14.0–49.7)
MCH: 29 pg (ref 25.1–34.0)
MCHC: 35.6 g/dL (ref 31.5–36.0)
MCV: 81.6 fL (ref 79.5–101.0)
MONO#: 1 10*3/uL — AB (ref 0.1–0.9)
MONO%: 8.6 % (ref 0.0–14.0)
NEUT%: 74.2 % (ref 38.4–76.8)
NEUTROS ABS: 8.9 10*3/uL — AB (ref 1.5–6.5)
Platelets: 194 10*3/uL (ref 145–400)
RBC: 4.89 10*6/uL (ref 3.70–5.45)
RDW: 13 % (ref 11.2–14.5)
RETIC %: 2.47 % — AB (ref 0.70–2.10)
Retic Ct Abs: 120.78 10*3/uL — ABNORMAL HIGH (ref 33.70–90.70)
WBC: 12 10*3/uL — AB (ref 3.9–10.3)
lymph#: 1.9 10*3/uL (ref 0.9–3.3)

## 2016-05-28 LAB — COMPREHENSIVE METABOLIC PANEL
ALBUMIN: 3.7 g/dL (ref 3.5–5.0)
ALK PHOS: 61 U/L (ref 40–150)
ALT: 14 U/L (ref 0–55)
AST: 18 U/L (ref 5–34)
Anion Gap: 9 mEq/L (ref 3–11)
BILIRUBIN TOTAL: 0.51 mg/dL (ref 0.20–1.20)
BUN: 5 mg/dL — AB (ref 7.0–26.0)
CO2: 23 mEq/L (ref 22–29)
Calcium: 9.4 mg/dL (ref 8.4–10.4)
Chloride: 105 mEq/L (ref 98–109)
Creatinine: 0.6 mg/dL (ref 0.6–1.1)
EGFR: >90 mL/min/{1.73_m2} (ref >90)
GLUCOSE: 88 mg/dL (ref 70–140)
Potassium: 3.6 mEq/L (ref 3.5–5.1)
SODIUM: 137 meq/L (ref 136–145)
TOTAL PROTEIN: 7 g/dL (ref 6.4–8.3)

## 2016-05-28 MED ORDER — ENOXAPARIN SODIUM 60 MG/0.6ML ~~LOC~~ SOLN: 60.0000 mg | SUBCUTANEOUS | 1 refills | Status: DC

## 2016-05-28 NOTE — Telephone Encounter (Signed)
No los per 05/28/16 los. Lab appointed for today, per Epic order on 05/28/16 los. Patient was given a copy of the AVS report and appointment schedule per 05/28/16 los.

## 2016-05-28 NOTE — Progress Notes (Signed)
[facility]    HEMATOLOGY/ONCOLOGY CONSULTATION NOTE  Date of Service: 05/28/2016  Patient Care Team: Provider Not In System as PCP - General Gyn Dr Vanessa Kick MD  CHIEF COMPLAINTS/PURPOSE OF CONSULTATION:  h/o Cryofibrinogenemia with new pregnancy  HISTORY OF PRESENTING ILLNESS:   Abigail James is a wonderful 31 y.o. 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  Patient notes that she started having issues with turning blue since age 65 months. She had her first pulmonary embolism in her teens and was on anticoagulation. She notes that she's had plenty of clots requiring anticoagulation. She reports having had a pulmonary embolism during pregnancy and being in the hospital for a week.  She notes that she was on heparin prophylaxis with her second pregnancy.  She has 2 children aged 71 years and 22 years old. She had a miscarriage in July 2014  Recently got pregnant again and notes that it was an unexpected pregnancy. She is here for recommendations regarding anticoagulation and management of high-risk pregnancy.    MEDICAL HISTORY:   #1 primary cryofibrinogenemia #2 history of miscarriage 11/2012 #3 migraine headaches #4 seasonal allergies #5 history of thyroiditis not on medications currently #6 history of chickenpox #7 history of anxiety and depression  SURGICAL HISTORY: Past Surgical History:  Procedure Laterality Date  . APPENDECTOMY    . CHOLECYSTECTOMY    . DILATION AND CURETTAGE OF UTERUS    . FOOT SURGERY    . LEG SURGERY    . TYMPANOSTOMY TUBE PLACEMENT      SOCIAL HISTORY: Social History   Social History  . Marital  status: Single    Spouse name: N/A  . Number of children: N/A  . Years of education: N/A   Occupational History  . Not on file.   Social History Main Topics  . Smoking status: Former Smoker    Quit date: 04/08/2014  . Smokeless tobacco: Never Used  . Alcohol use Yes     Comment: occasional  . Drug use: No  . Sexual activity: Yes    Birth control/ protection: None   Other Topics Concern  . 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  FAMILY HISTORY:  Paternal grandmother with hypertension and diabetes Father hypertension Does not know the mother's side of her family  ALLERGIES:  is allergic to ciprofloxacin; latex; clindamycin/lincomycin; vancomycin; amoxicillin; penicillins; and sulfa antibiotics.  MEDICATIONS:  Current Outpatient Prescriptions  Medication Sig Dispense Refill  . acetaminophen (TYLENOL) 325 MG tablet Take 650 mg by mouth every 6 (six) hours as needed for moderate pain.    Marland Kitchen DICLEGIS 10-10 MG TBEC TK 2 TS PO QD  3  . Prenatal Vit-Fe Phos-FA-Omega (VITAFOL GUMMIES) 3.33-0.333-34.8 MG CHEW CHEW AND SWALLOW 1 GUMMY PO D  9  . enoxaparin (LOVENOX) 60 MG/0.6ML injection Inject 0.6 mLs (60 mg total) into the skin daily. Do not start unless cleared by your gynecologist from a uterine bleeding standpoint. 30 Syringe  1  . LORazepam (ATIVAN) 0.5 MG tablet Take 0.5 mg by mouth 2 (two) times daily as needed for anxiety.      No current facility-administered medications for this visit.     REVIEW OF SYSTEMS:    10 Point review of Systems was done is negative except as noted above.  PHYSICAL EXAMINATION: ECOG PERFORMANCE STATUS: 0 - Asymptomatic  . Vitals:   05/28/16 1345  BP: 138/87  Pulse: 99  Resp: 19  Temp: 98.3 F (36.8 C)   Filed Weights   05/28/16 1345  Weight: 151 lb  9.6 oz (68.8 kg)   .Body mass index is 24.47 kg/m.  GENERAL:alert, in no acute distress and comfortable SKIN: skin color, texture, turgor are normal, no rashes or significant lesions EYES: normal, conjunctiva are pink and non-injected, sclera clear OROPHARYNX:no exudate, no erythema and lips, buccal mucosa, and tongue normal  NECK: supple, no JVD, thyroid normal size, non-tender, without nodularity LYMPH:  no palpable lymphadenopathy in the cervical, axillary or inguinal LUNGS: clear to auscultation with normal respiratory effort HEART: regular rate & rhythm,  no murmurs and no lower extremity edema ABDOMEN: abdomen soft, non-tender, normoactive bowel sounds  Musculoskeletal: no cyanosis of digits and no clubbing  PSYCH: alert & oriented x 3 with fluent speech NEURO: no focal motor/sensory deficits  LABORATORY DATA:  I have reviewed the data as listed Component     Latest Ref Rng & Units 05/28/2016  WBC     3.9 - 10.3 10e3/uL 12.0 (H)  NEUT#     1.5 - 6.5 10e3/uL 8.9 (H)  Hemoglobin     11.6 - 15.9 g/dL 14.2  HCT     34.8 - 46.6 % 39.9  Platelets     145 - 400 10e3/uL 194  MCV     79.5 - 101.0 fL 81.6  MCH     25.1 - 34.0 pg 29.0  MCHC     31.5 - 36.0 g/dL 35.6  RBC     3.70 - 5.45 10e6/uL 4.89  RDW     11.2 - 14.5 % 13.0  lymph#     0.9 - 3.3 10e3/uL 1.9  MONO#     0.1 - 0.9 10e3/uL 1.0 (H)  Eosinophils Absolute     0.0 - 0.5 10e3/uL 0.2  Basophils Absolute     0.0 - 0.1 10e3/uL 0.0  NEUT%     38.4 - 76.8 % 74.2  LYMPH%     14.0 - 49.7 % 15.4  MONO%     0.0 - 14.0 % 8.6  EOS%     0.0 - 7.0 % 1.7  BASO%     0.0 - 2.0 % 0.1  Retic %     0.70 - 2.10 % 2.47 (H)  Retic Ct Abs     33.70 - 90.70 10e3/uL 120.78 (H)  Immature Retic Fract     1.60 - 10.00 % 3.80  Sodium     136 - 145 mEq/L 137  Potassium     3.5 - 5.1 mEq/L 3.6  Chloride     98 - 109 mEq/L 105  CO2     22 - 29 mEq/L 23  Glucose     70 - 140 mg/dl 88  BUN     7.0 - 26.0 mg/dL 5.0 (L)    Creatinine     0.6 - 1.1 mg/dL 0.6  Total Bilirubin     0.20 - 1.20 mg/dL 0.51  Alkaline Phosphatase  40 - 150 U/L 61  AST     5 - 34 U/L 18  ALT     0 - 55 U/L 14  Total Protein     6.4 - 8.3 g/dL 7.0  Albumin     3.5 - 5.0 g/dL 3.7  Calcium     8.4 - 10.4 mg/dL 9.4  Anion gap     3 - 11 mEq/L 9  EGFR     >90 ml/min/1.73 m2 >90  Fibrinogen     193 - 507 mg/dL 525 (H)  hCG,Beta Subunit,Qual,Serum     Negative <6 mIU/mL Positive (A)  Cryofibrinogen, Qualitative      Comment   Cryofibrinogen, Qualitative      No reference range information available      Resulting Lab: [facility]      Comments:           None Detected at 72 hours           Normal:       None Detected . RADIOGRAPHIC STUDIES: I have personally reviewed the radiological images as listed and agreed with the findings in the report. US Ob Comp Less 14 Wks  Addendum Date: 05/07/2016   ADDENDUM REPORT: 05/07/2016 22:31 ADDENDUM: Additional clinical information, quantitative beta HCG is 146,175. This is somewhat greater than expected given the ultrasound dating of the fetus at 6 weeks 4 days. Molar pregnancy is a consideration although ultrasound appearance of the mass is not typical for this. Recommend close clinical monitoring and correlation with quantitative beta HCG with repeat ultrasound as indicated. Electronically Signed   By: Donavan Foil M.D.   On: 05/07/2016 22:31   Addendum Date: 05/06/2016   ADDENDUM REPORT: 05/06/2016 22:46 ADDENDUM: Correction to impression 2. Echogenic mass within the amnion may represent sub amniotic hematoma. Electronically Signed   By: Donavan Foil M.D.   On: 05/06/2016 22:46   Result Date: 05/07/2016 CLINICAL DATA:  Vaginal bleeding EXAM: OBSTETRIC <14 WK [place] AND TRANSVAGINAL OB US TECHNIQUE: Both transabdominal and transvaginal ultrasound examinations were performed for complete evaluation of the gestation as well as the maternal uterus, adnexal regions, and pelvic cul-de-sac.  Transvaginal technique was performed to assess early pregnancy. COMPARISON:  None. FINDINGS: Intrauterine gestational sac: Single intrauterine gestation Yolk sac:  Visualized Embryo:  Visualized Cardiac Activity: Visualized Heart Rate: 124  bpm CRL:  7.5  mm   6 w   4 d                  [place] EDC: 12/26/2016 Subchorionic hemorrhage:  None visualized. Maternal uterus/adnexae: Bilateral ovaries are within normal limits. The right ovary measures 4.3 x 2.7 by 2.6 cm. There is a 2.4 x 2.2 x 2.6 cm corpus luteal cyst in the right ovary. The left ovary measures 2.1 by 3.1 x 1.6 cm. There is an echogenic mass present within the amniotic sac adjacent to the fetal pole. This measures 2.2 x 0.9 x 1.7 cm IMPRESSION: 1. Single intrauterine gestation with fetal cardiac activity as above 2. 2.2 cm echogenic mass within the amniotic sac, possibly representing an intra amniotic hematoma. Electronically Signed: By: Donavan Foil M.D. On: 05/06/2016 21:55   US Ob Transvaginal  Addendum Date: 05/07/2016   ADDENDUM REPORT: 05/07/2016 22:31 ADDENDUM: Additional clinical information, quantitative beta HCG is 146,175. This is somewhat greater than expected given the ultrasound dating of the fetus at 6 weeks 4 days. Molar pregnancy is a consideration although ultrasound appearance of the mass  is not typical for this. Recommend close clinical monitoring and correlation with quantitative beta HCG with repeat ultrasound as indicated. Electronically Signed   By: Donavan Foil M.D.   On: 05/07/2016 22:31   Addendum Date: 05/06/2016   ADDENDUM REPORT: 05/06/2016 22:46 ADDENDUM: Correction to impression 2. Echogenic mass within the amnion may represent sub amniotic hematoma. Electronically Signed   By: Donavan Foil M.D.   On: 05/06/2016 22:46   Result Date: 05/07/2016 CLINICAL DATA:  Vaginal bleeding EXAM: OBSTETRIC <14 WK [place] AND TRANSVAGINAL OB US TECHNIQUE: Both transabdominal and transvaginal ultrasound examinations were performed for  complete evaluation of the gestation as well as the maternal uterus, adnexal regions, and pelvic cul-de-sac. Transvaginal technique was performed to assess early pregnancy. COMPARISON:  None. FINDINGS: Intrauterine gestational sac: Single intrauterine gestation Yolk sac:  Visualized Embryo:  Visualized Cardiac Activity: Visualized Heart Rate: 124  bpm CRL:  7.5  mm   6 w   4 d                  [place] EDC: 12/26/2016 Subchorionic hemorrhage:  None visualized. Maternal uterus/adnexae: Bilateral ovaries are within normal limits. The right ovary measures 4.3 x 2.7 by 2.6 cm. There is a 2.4 x 2.2 x 2.6 cm corpus luteal cyst in the right ovary. The left ovary measures 2.1 by 3.1 x 1.6 cm. There is an echogenic mass present within the amniotic sac adjacent to the fetal pole. This measures 2.2 x 0.9 x 1.7 cm IMPRESSION: 1. Single intrauterine gestation with fetal cardiac activity as above 2. 2.2 cm echogenic mass within the amniotic sac, possibly representing an intra amniotic hematoma. Electronically Signed: By: Donavan Foil M.D. 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 Has been on heparin prophylaxis with previous pregnancies. Currently she is pregnant again.  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 Plan - all information was obtained from the patient and from [facility] records through care everywhere . -She will need to follow-up with [facility] for her hematology care's since she has been cared for there before . -We gave her a prescription for Lovenox 60 mg subcutaneous every 24 hours for venous thrombotic prophylaxis which would need to be continued during pregnancy and for at least 6 weeks postpartum. -She was recommended to stay warm and avoid cold exposure  -We will need close follow-up with high risk  OB/GYN . -We shall defer further hematologic care still the team at [facility] where she was established for cares previously .   All of the patients questions were answered with apparent satisfaction. The patient knows to call the clinic with any problems, questions or concerns.  I spent 45 minutes counseling the patient face to face. The total time spent in the appointment was 60 minutes and more than 50% was on counseling and direct patient cares.    Sullivan Lone MD [place] AAHIVMS [facility] Hematology/Oncology Physician [facility]  (Office):       [phone] (Work cell):  [phone] (Fax):           [phone]  05/28/2016 2:28 PM

## 2016-05-29 ENCOUNTER — Encounter: Payer: Self-pay | Admitting: Hematology

## 2016-05-29 LAB — HCG, SERUM, QUALITATIVE: HCG, BETA SUBUNIT, QUAL, SERUM: POSITIVE m[IU]/mL — AB (ref <6)

## 2016-05-29 LAB — FIBRINOGEN: FIBRINOGEN: 525 mg/dL — ABNORMAL HIGH (ref 193–507)

## 2016-05-29 NOTE — Progress Notes (Signed)
Called [place] [employer] to initiate auth for Enoxaparin and was informed if pt uses the brand name Lovenox it will be covered. I notified her pharmacy of this.

## 2016-06-02 LAB — CRYOFIBRINOGEN, QUALITATIVE

## 2016-07-08 ENCOUNTER — Inpatient Hospital Stay (HOSPITAL_COMMUNITY)
Admission: AD | Admit: 2016-07-08 | Discharge: 2016-07-09 | Disposition: A | Discharge status: Home / Self Care | Payer: Medicaid Other | Source: Ambulatory Visit | Attending: Family Medicine | Admitting: Family Medicine

## 2016-07-08 ENCOUNTER — Encounter (HOSPITAL_COMMUNITY): Payer: Self-pay | Admitting: *Deleted

## 2016-07-08 DIAGNOSIS — Z7901 Long term (current) use of anticoagulants: Secondary | ICD-10-CM | POA: Diagnosis not present

## 2016-07-08 DIAGNOSIS — R070 Pain in throat: Secondary | ICD-10-CM | POA: Diagnosis present

## 2016-07-08 DIAGNOSIS — Z86711 Personal history of pulmonary embolism: Secondary | ICD-10-CM | POA: Diagnosis not present

## 2016-07-08 DIAGNOSIS — O289 Unspecified abnormal findings on antenatal screening of mother: Secondary | ICD-10-CM

## 2016-07-08 DIAGNOSIS — Z3A15 15 weeks gestation of pregnancy: Secondary | ICD-10-CM

## 2016-07-08 DIAGNOSIS — O0990 Supervision of high risk pregnancy, unspecified, unspecified trimester: Secondary | ICD-10-CM

## 2016-07-08 DIAGNOSIS — Z880 Allergy status to penicillin: Secondary | ICD-10-CM | POA: Insufficient documentation

## 2016-07-08 DIAGNOSIS — K209 Esophagitis, unspecified without bleeding: Secondary | ICD-10-CM

## 2016-07-08 DIAGNOSIS — Z86718 Personal history of other venous thrombosis and embolism: Secondary | ICD-10-CM | POA: Insufficient documentation

## 2016-07-08 DIAGNOSIS — K2100 Gastro-esophageal reflux disease with esophagitis, without bleeding: Secondary | ICD-10-CM

## 2016-07-08 DIAGNOSIS — O09899 Supervision of other high risk pregnancies, unspecified trimester: Secondary | ICD-10-CM | POA: Diagnosis not present

## 2016-07-08 DIAGNOSIS — E0590 Thyrotoxicosis, unspecified without thyrotoxic crisis or storm: Secondary | ICD-10-CM | POA: Diagnosis not present

## 2016-07-08 DIAGNOSIS — O99112 Other diseases of the blood and blood-forming organs and certain disorders involving the immune mechanism complicating pregnancy, second trimester: Secondary | ICD-10-CM | POA: Diagnosis not present

## 2016-07-08 DIAGNOSIS — Z87891 Personal history of nicotine dependence: Secondary | ICD-10-CM | POA: Insufficient documentation

## 2016-07-08 DIAGNOSIS — O99282 Endocrine, nutritional and metabolic diseases complicating pregnancy, second trimester: Secondary | ICD-10-CM | POA: Insufficient documentation

## 2016-07-08 DIAGNOSIS — Z91040 Latex allergy status: Secondary | ICD-10-CM | POA: Diagnosis not present

## 2016-07-08 DIAGNOSIS — O09892 Supervision of other high risk pregnancies, second trimester: Secondary | ICD-10-CM | POA: Diagnosis not present

## 2016-07-08 DIAGNOSIS — Z881 Allergy status to other antibiotic agents status: Secondary | ICD-10-CM | POA: Insufficient documentation

## 2016-07-08 DIAGNOSIS — Z882 Allergy status to sulfonamides status: Secondary | ICD-10-CM | POA: Diagnosis not present

## 2016-07-08 DIAGNOSIS — D892 Hypergammaglobulinemia, unspecified: Secondary | ICD-10-CM | POA: Diagnosis not present

## 2016-07-08 DIAGNOSIS — O99612 Diseases of the digestive system complicating pregnancy, second trimester: Secondary | ICD-10-CM | POA: Diagnosis not present

## 2016-07-08 LAB — URINALYSIS, ROUTINE W REFLEX MICROSCOPIC
BILIRUBIN URINE: NEGATIVE
GLUCOSE, UA: NEGATIVE mg/dL
HGB URINE DIPSTICK: NEGATIVE
Ketones, ur: NEGATIVE mg/dL
Leukocytes, UA: NEGATIVE
Nitrite: NEGATIVE
PH: 7 (ref 5.0–8.0)
Protein, ur: NEGATIVE mg/dL
SPECIFIC GRAVITY, URINE: 1.006 (ref 1.005–1.030)

## 2016-07-08 MED ORDER — GI COCKTAIL ~~LOC~~
30.0000 mL | Freq: Once | ORAL | Status: AC
  Administered 2016-07-09: 30 mL via ORAL
  Filled 2016-07-08: qty 30

## 2016-07-08 NOTE — MAU Note (Signed)
PT  SAYS SHE FEELS LIKE SOMETHING IS STUCK IN HER THROAT- SHE TOOK 2 BITES OF  [place] AND CHEESE SANDWICH AT 630PM-    SHE  VOMITED  SANDWICH.   NOW WHEN SHE DRINKS - IT HURTS.     [facility]-    [place]  BC OF HIGH RISK.

## 2016-07-08 NOTE — MAU Provider Note (Signed)
History     CSN: [id number]  Arrival date and time: 07/08/16 2315   First Provider Initiated Contact with Patient 07/08/16 2353      Chief Complaint  Patient presents with  . THROAT HURTS   [id number]  wks here with throat pain. She reports an episode of emesis after eating a [place] sandwich tonight. Since that time her throat has been sore. She reports emesis was large chunks of food. She thinks she may have choked on the food. She also reports a feeling of burning and thickness in her throat since the episode. Denies VB or pain. 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    OB History    Gravida Para Term Preterm AB Living   SAB TAB Ectopic Multiple Live Births   1 0 0 0 2      No past medical history on file.  Past Surgical History:  Procedure Laterality Date  . APPENDECTOMY    . CHOLECYSTECTOMY    . DILATION AND CURETTAGE OF UTERUS    . FOOT SURGERY    . LEG SURGERY    . TYMPANOSTOMY TUBE PLACEMENT      History reviewed. No pertinent family history.  Social History  Substance Use Topics  . Smoking status: Former Smoker    Quit date: 04/08/2014  . Smokeless tobacco: Never Used  . Alcohol use Yes     Comment: occasional    Allergies:  Allergies  Allergen Reactions  . Ciprofloxacin Anaphylaxis  . Latex Swelling  . Clindamycin/Lincomycin     ? Reaction, - dizzy, facial tingling, nausea, severe chest pain  . Vancomycin Nausea And Vomiting  . Amoxicillin Rash  . Penicillins Rash    Has patient had a PCN reaction causing immediate rash, facial/tongue/throat swelling, SOB or lightheadedness with hypotension: yes Has patient had a PCN reaction causing severe rash involving mucus membranes or skin necrosis:no Has patient had a PCN reaction that required hospitalization: unknown  Has patient had a PCN reaction  occurring within the last 10 years: no If all of the above answers are "NO", then may proceed with Cephalosporin use.   . Sulfa Antibiotics Swelling and Rash    Prescriptions Prior to Admission  Medication Sig Dispense Refill Last Dose  . acetaminophen (TYLENOL) 325 MG tablet Take 650 mg by mouth every 6 (six) hours as needed for moderate pain.   Taking  . DICLEGIS 10-10 MG TBEC TK 2 TS PO QD  3 Taking  . enoxaparin (LOVENOX) 60 MG/0.6ML injection Inject 0.6 mLs (60 mg total) into the skin daily. Do not start unless cleared by your gynecologist from a uterine bleeding standpoint. 30 Syringe 1   . LORazepam (ATIVAN) 0.5 MG tablet Take 0.5 mg by mouth 2 (two) times daily as needed for anxiety.    Not Taking  . Prenatal Vit-Fe Phos-FA-Omega (VITAFOL GUMMIES) 3.33-0.333-34.8 MG CHEW CHEW AND SWALLOW 1 GUMMY PO D  9 Taking    Review of Systems  HENT: Positive for sore throat.   Respiratory: Positive for choking. Negative for cough and shortness of breath.   Gastrointestinal: Positive for vomiting. Negative for abdominal pain.  Genitourinary: Negative for vaginal bleeding.   Physical Exam   Blood pressure 124/67, pulse 96, temperature 98.2 F (36.8 C), temperature source Oral, last menstrual period 03/25/2016.  Physical Exam  Nursing note  and vitals reviewed. Constitutional: She is oriented to person, place, and time. She appears well-developed and well-nourished. No distress.  HENT:  Head: Normocephalic and atraumatic.  Mouth/Throat: Uvula is midline and mucous membranes are normal. Posterior oropharyngeal erythema (mild) present. No oropharyngeal exudate, posterior oropharyngeal edema or tonsillar abscesses.  Cardiovascular: Normal rate.   Respiratory: Effort normal.  Musculoskeletal: Normal range of motion.  Neurological: She is alert and oriented to person, place, and time.  Skin: Skin is warm and dry.  Psychiatric: She has a normal mood and affect.  FHT: 149 bpm  Results for  orders placed or performed during the hospital encounter of 07/08/16 (from the past 24 hour(s))  Urinalysis, Routine w reflex microscopic     Status: None   Collection Time: 07/08/16 11:21 PM  Result Value Ref Range   Color, Urine YELLOW YELLOW   APPearance CLEAR CLEAR   Specific Gravity, Urine 1.006 1.005 - 1.030   pH 7.0 5.0 - 8.0   Glucose, UA NEGATIVE NEGATIVE mg/dL   Hgb urine dipstick NEGATIVE NEGATIVE   Bilirubin Urine NEGATIVE NEGATIVE   Ketones, ur NEGATIVE NEGATIVE mg/dL   Protein, ur NEGATIVE NEGATIVE mg/dL   Nitrite NEGATIVE NEGATIVE   Leukocytes, UA NEGATIVE NEGATIVE    MAU Course  Procedures GI cocktail  MDM Pain likely caused by esophagitis from GERD and/or trauma from vomiting. Stable for discharge home.  Assessment and Plan   1. [redacted] weeks gestation of pregnancy   2. High risk pregnancy with high human chorionic gonadotropin (hCG)   3. Esophagitis   4. Gastroesophageal reflux disease with esophagitis    Discharge home Follow up with primary OB as scheduled Salt water gargles prn May use Zantac OTC daily for GERD   Allergies as of 07/09/2016      Reactions   Ciprofloxacin Anaphylaxis   Latex Swelling   Clindamycin/lincomycin    ? Reaction, - dizzy, facial tingling, nausea, severe chest pain   Vancomycin Nausea And Vomiting   Amoxicillin Rash   Penicillins Rash   Has patient had a PCN reaction causing immediate rash, facial/tongue/throat swelling, SOB or lightheadedness with hypotension: yes Has patient had a PCN reaction causing severe rash involving mucus membranes or skin necrosis:no Has patient had a PCN reaction that required hospitalization: unknown  Has patient had a PCN reaction occurring within the last 10 years: no If all of the above answers are "NO", then may proceed with Cephalosporin use.   Sulfa Antibiotics Swelling, Rash      Medication List    TAKE these medications   acetaminophen 325 MG tablet Commonly known as:  TYLENOL Take  650 mg by mouth every 6 (six) hours as needed for moderate pain.   DICLEGIS 10-10 MG Tbec Generic drug:  Doxylamine-Pyridoxine TK 2 TS PO QD   enoxaparin 60 MG/0.6ML injection Commonly known as:  LOVENOX Inject 0.6 mLs (60 mg total) into the skin daily. Do not start unless cleared by your gynecologist from a uterine bleeding standpoint.   LORazepam 0.5 MG tablet Commonly known as:  ATIVAN Take 0.5 mg by mouth 2 (two) times daily as needed for anxiety.   VITAFOL GUMMIES 3.33-0.333-34.8 MG Chew CHEW AND SWALLOW 1 GUMMY PO D      Donette Larry, CNM 07/08/2016, 11:57 PM

## 2016-07-09 DIAGNOSIS — K210 Gastro-esophageal reflux disease with esophagitis: Secondary | ICD-10-CM | POA: Diagnosis not present

## 2016-07-09 DIAGNOSIS — K209 Esophagitis, unspecified: Secondary | ICD-10-CM | POA: Diagnosis not present

## 2016-07-09 DIAGNOSIS — O289 Unspecified abnormal findings on antenatal screening of mother: Secondary | ICD-10-CM | POA: Diagnosis not present

## 2016-07-09 DIAGNOSIS — O09899 Supervision of other high risk pregnancies, unspecified trimester: Secondary | ICD-10-CM | POA: Diagnosis not present

## 2016-07-09 DIAGNOSIS — Z3A15 15 weeks gestation of pregnancy: Secondary | ICD-10-CM | POA: Diagnosis not present

## 2016-07-09 NOTE — Discharge Instructions (Signed)
Barrett Esophagus Barrett esophagus occurs when the tissue that lines the esophagus changes or becomes damaged. The esophagus is the tube that carries food from the throat to the stomach. With Barrett esophagus, the cells that line the esophagus are replaced by cells that are similar to the lining of the intestines (intestinal metaplasia). Barrett esophagus itself may not cause any symptoms. However, many people who have Barrett esophagus also have gastroesophageal reflux disease (GERD), which may cause symptoms such as heartburn. Treatment may include medicines, procedures to destroy the abnormal cells, or surgery. Over time, a few people with this condition may develop cancer of the esophagus. What are the causes? The exact cause of this condition is not known. In some cases, the condition develops from damage to the lining of the esophagus caused by GERD. GERD occurs when stomach acids flow up from the stomach into the esophagus. Frequent symptoms of GERD may cause intestinal metaplasia or cause cell changes (dysplasia). What increases the risk? The following factors may make you more likely to develop this condition:  Having GERD.  Being any of the following:  Female.  White (Caucasian).  Obese.  Older than 50.  Having a hiatal hernia.  Smoking. What are the signs or symptoms? People with Barrett esophagus often have no symptoms. However, many people with this condition also have GERD. Symptoms of GERD may include:  Heartburn.  Difficulty swallowing.  Dry cough. How is this diagnosed? Barrett esophagus may be diagnosed with an exam called an upper gastrointestinal endoscopy. During this exam, a thin, flexible tube (endoscope) is passed down your esophagus. The endoscope has a light and camera on the end of it. Your health care provider uses the endoscope to view the inside of your esophagus. During the exam, several tissue samples will be removed (biopsy) from your esophagus so they  can be checked for intestinal metaplasia or dysplasia. How is this treated? Treatment for this condition may include:  Medicines (proton pump inhibitors, or PPIs) to decrease or stop GERD.  Periodic endoscopic exams to make sure that cancer is not developing.  A procedure or surgery for dysplasia. This may include:  Endoscopic removal or destruction of abnormal cells.  Removal of part of the esophagus (esophagectomy). Follow these instructions at home: Eating and drinking   Eat more fruits and vegetables.  Avoid fatty foods.  Eat small, frequent meals instead of large meals.  Avoid foods that cause heartburn. These foods include:  Coffee and alcoholic drinks.  Tomatoes and foods made with tomatoes.  Greasy or spicy foods.  Chocolate and peppermint. General instructions    Take over-the-counter and prescription medicines only as told by your health care provider.  Do not use any tobacco products, such as cigarettes, chewing tobacco, and e-cigarettes. If you need help quitting, ask your health care provider.  If your health care provider is treating you for GERD, make sure you follow all instructions and take medicines as directed.  Keep all follow-up visits as told by your health care provider. This is important. Contact a health care provider if:  You have heartburn or GERD symptoms.  You have difficulty swallowing. Get help right away if:  You have chest pain.  You are unable to swallow.  You vomit blood or material that looks like coffee grounds.  Your stool (feces) is bright red or dark. This information is not intended to replace advice given to you by your health care provider. Make sure you discuss any questions you have with your  health care provider. Document Released: 06/07/2003 Document Revised: 08/23/2015 Document Reviewed: 12/28/2014 Elsevier Interactive Patient Education  2017 [employer] Heartburn Heartburn is a type of pain or discomfort  that can happen in the throat or chest. It is often described as a burning pain. It may also cause a bad taste in the mouth. Heartburn may feel worse when you lie down or bend over. It may be caused by stomach contents that move back up (reflux) into the tube that connects the mouth with the stomach (esophagus). Follow these instructions at home: Take these actions to lessen your discomfort and to help avoid problems. Diet   Follow a diet as told by your doctor. You may need to avoid foods and drinks such as:  Coffee and tea (with or without caffeine).  Drinks that contain alcohol.  Energy drinks and sports drinks.  Carbonated drinks or sodas.  Chocolate and cocoa.  Peppermint and mint flavorings.  Garlic and onions.  Horseradish.  Spicy and acidic foods, such as peppers, chili powder, curry powder, vinegar, hot sauces, and BBQ sauce.  Citrus fruit juices and citrus fruits, such as oranges, lemons, and limes.  Tomato-based foods, such as red sauce, chili, salsa, and pizza with red sauce.  Fried and fatty foods, such as donuts, french fries, potato chips, and high-fat dressings.  High-fat meats, such as hot dogs, rib eye steak, sausage, ham, and bacon.  High-fat dairy items, such as whole milk, butter, and cream cheese.  Eat small meals often. Avoid eating large meals.  Avoid drinking large amounts of liquid with your meals.  Avoid eating meals during the 2-3 hours before bedtime.  Avoid lying down right after you eat.  Do not exercise right after you eat. General instructions   Pay attention to any changes in your symptoms.  Take over-the-counter and prescription medicines only as told by your doctor. Do not take aspirin, ibuprofen, or other NSAIDs unless your doctor says it is okay.  Do not use any tobacco products, including cigarettes, chewing tobacco, and e-cigarettes. If you need help quitting, ask your doctor.  Wear loose clothes. Do not wear anything tight  around your waist.  Raise (elevate) the head of your bed about 6 inches (15 cm).  Try to lower your stress. If you need help doing this, ask your doctor.  If you are overweight, lose an amount of weight that is healthy for you. Ask your doctor about a safe weight loss goal.  Keep all follow-up visits as told by your doctor. This is important. Contact a doctor if:  You have new symptoms.  You lose weight and you do not know why it is happening.  You have trouble swallowing, or it hurts to swallow.  You have wheezing or a cough that keeps happening.  Your symptoms do not get better with treatment.  You have heartburn often for more than two weeks. Get help right away if:  You have pain in your arms, neck, jaw, teeth, or back.  You feel sweaty, dizzy, or light-headed.  You have chest pain or shortness of breath.  You throw up (vomit) and your throw up looks like blood or coffee grounds.  Your poop (stool) is bloody or black. This information is not intended to replace advice given to you by your health care provider. Make sure you discuss any questions you have with your health care provider. Document Released: 11/27/2010 Document Revised: 08/23/2015 Document Reviewed: 07/12/2014 Elsevier Interactive Patient Education  2017 [employer].

## 2016-09-08 ENCOUNTER — Inpatient Hospital Stay (HOSPITAL_COMMUNITY)
Admission: AD | Admit: 2016-09-08 | Discharge: 2016-09-08 | Disposition: A | Discharge status: Home / Self Care | Payer: Medicaid Other | Source: Ambulatory Visit | Attending: Obstetrics and Gynecology | Admitting: Obstetrics and Gynecology

## 2016-09-08 ENCOUNTER — Encounter (HOSPITAL_COMMUNITY): Payer: Self-pay | Admitting: *Deleted

## 2016-09-08 DIAGNOSIS — Z880 Allergy status to penicillin: Secondary | ICD-10-CM | POA: Insufficient documentation

## 2016-09-08 DIAGNOSIS — Z87891 Personal history of nicotine dependence: Secondary | ICD-10-CM | POA: Insufficient documentation

## 2016-09-08 DIAGNOSIS — O289 Unspecified abnormal findings on antenatal screening of mother: Secondary | ICD-10-CM

## 2016-09-08 DIAGNOSIS — Z3A23 23 weeks gestation of pregnancy: Secondary | ICD-10-CM | POA: Insufficient documentation

## 2016-09-08 DIAGNOSIS — O9989 Other specified diseases and conditions complicating pregnancy, childbirth and the puerperium: Secondary | ICD-10-CM | POA: Diagnosis not present

## 2016-09-08 DIAGNOSIS — O09899 Supervision of other high risk pregnancies, unspecified trimester: Secondary | ICD-10-CM

## 2016-09-08 DIAGNOSIS — O219 Vomiting of pregnancy, unspecified: Secondary | ICD-10-CM

## 2016-09-08 DIAGNOSIS — O1202 Gestational edema, second trimester: Secondary | ICD-10-CM

## 2016-09-08 DIAGNOSIS — R51 Headache: Secondary | ICD-10-CM | POA: Insufficient documentation

## 2016-09-08 DIAGNOSIS — O26892 Other specified pregnancy related conditions, second trimester: Secondary | ICD-10-CM | POA: Diagnosis not present

## 2016-09-08 DIAGNOSIS — R519 Headache, unspecified: Secondary | ICD-10-CM

## 2016-09-08 DIAGNOSIS — Z881 Allergy status to other antibiotic agents status: Secondary | ICD-10-CM | POA: Insufficient documentation

## 2016-09-08 DIAGNOSIS — O212 Late vomiting of pregnancy: Secondary | ICD-10-CM | POA: Insufficient documentation

## 2016-09-08 DIAGNOSIS — O281 Abnormal biochemical finding on antenatal screening of mother: Secondary | ICD-10-CM

## 2016-09-08 DIAGNOSIS — D892 Hypergammaglobulinemia, unspecified: Secondary | ICD-10-CM

## 2016-09-08 LAB — CBC
HCT: 36.1 % (ref 36.0–46.0)
Hemoglobin: 12.6 g/dL (ref 12.0–15.0)
MCH: 27.5 pg (ref 26.0–34.0)
MCHC: 34.9 g/dL (ref 30.0–36.0)
MCV: 78.8 fL (ref 78.0–100.0)
Platelets: 195 10*3/uL (ref 150–400)
RBC: 4.58 MIL/uL (ref 3.87–5.11)
RDW: 13.1 % (ref 11.5–15.5)
WBC: 12.8 10*3/uL — ABNORMAL HIGH (ref 4.0–10.5)

## 2016-09-08 LAB — URINALYSIS, ROUTINE W REFLEX MICROSCOPIC
Bilirubin Urine: NEGATIVE
GLUCOSE, UA: NEGATIVE mg/dL
HGB URINE DIPSTICK: NEGATIVE
KETONES UR: NEGATIVE mg/dL
LEUKOCYTES UA: NEGATIVE
Nitrite: NEGATIVE
PROTEIN: NEGATIVE mg/dL
Specific Gravity, Urine: 1.013 (ref 1.005–1.030)
pH: 5 (ref 5.0–8.0)

## 2016-09-08 LAB — COMPREHENSIVE METABOLIC PANEL
ALBUMIN: 3.1 g/dL — AB (ref 3.5–5.0)
ALT: 10 U/L — ABNORMAL LOW (ref 14–54)
ANION GAP: 7 (ref 5–15)
AST: 16 U/L (ref 15–41)
Alkaline Phosphatase: 62 U/L (ref 38–126)
BUN: <5 mg/dL — ABNORMAL LOW (ref 6–20)
CO2: 20 mmol/L — AB (ref 22–32)
Calcium: 8.3 mg/dL — ABNORMAL LOW (ref 8.9–10.3)
Chloride: 107 mmol/L (ref 101–111)
Creatinine, Ser: 0.34 mg/dL — ABNORMAL LOW (ref 0.44–1.00)
GFR calc Af Amer: >60 mL/min (ref >60)
GFR calc non Af Amer: >60 mL/min (ref >60)
GLUCOSE: 83 mg/dL (ref 65–99)
POTASSIUM: 3.3 mmol/L — AB (ref 3.5–5.1)
SODIUM: 134 mmol/L — AB (ref 135–145)
Total Bilirubin: 0.4 mg/dL (ref 0.3–1.2)
Total Protein: 6.4 g/dL — ABNORMAL LOW (ref 6.5–8.1)

## 2016-09-08 LAB — PROTEIN / CREATININE RATIO, URINE
Creatinine, Urine: 85 mg/dL
PROTEIN CREATININE RATIO: 0.09 mg/mg{creat} (ref 0.00–0.15)
Total Protein, Urine: 8 mg/dL

## 2016-09-08 MED ORDER — BUTALBITAL-APAP-CAFFEINE 50-325-40 MG PO TABS
2.0000 | ORAL_TABLET | Freq: Once | ORAL | Status: AC
  Administered 2016-09-08: 2 via ORAL
  Filled 2016-09-08: qty 2

## 2016-09-08 MED ORDER — BUTALBITAL-APAP-CAFFEINE 50-325-40 MG PO TABS: 1.0000 | ORAL_TABLET | Freq: Four times a day (QID) | ORAL | 0 refills | Status: DC | PRN

## 2016-09-08 NOTE — MAU Note (Cosign Needed)
Chief Complaint:  Foot Swelling; Muscle Pain; Headache; and Hypertension                Abigail James is a 31 y.o. [id number] who presents today with swelling, headache, and tingling in her face for 3 days. [facility], found it to be elevated at 148/72, and was told by her provider to come to MAU for evaluation. Headache is localized to her left and is accompanied by numbness; Tylenol does not help (most recent dose 8:30 this AM). She reports a history of migraines. Edema in legs is accompanied by soreness; patient feels sore in upper arms as well. She notes +fetal movements without changes. She denies vaginal bleeding and discharge; endorses nausea with vomiting. Notes some lower abdominal cramps and mild back ache but denies any new-onset or severe pain.   Obstetrical/Gynecological History: G4P2 Hx miscarriage at 11 weeks   Past Medical History: History reviewed. No pertinent past medical history.  Past Surgical History: Past Surgical History:  Procedure Laterality Date  . APPENDECTOMY    . CHOLECYSTECTOMY    . DILATION AND CURETTAGE OF UTERUS    . FOOT SURGERY    . LEG SURGERY    . TYMPANOSTOMY TUBE PLACEMENT     Family History: History reviewed. No pertinent family history.  Social History: Social History  Substance Use Topics  . Smoking status: Former Smoker    Quit date: 04/08/2014  . Smokeless tobacco: Never Used  . Alcohol use Yes     Comment: occasional   Allergies:  Allergies  Allergen Reactions  . Ciprofloxacin Anaphylaxis  . Latex Swelling  . Clindamycin/Lincomycin     ? Reaction, - dizzy, facial tingling, nausea, severe chest pain  . Vancomycin Nausea And Vomiting  . Amoxicillin Rash  . Penicillins Rash    Has patient had a PCN reaction causing immediate rash, facial/tongue/throat swelling, SOB or lightheadedness with hypotension: yes Has patient had a PCN reaction causing severe rash involving mucus membranes or skin  necrosis:no Has patient had a PCN reaction that required hospitalization: unknown  Has patient had a PCN reaction occurring within the last 10 years: no If all of the above answers are "NO", then may proceed with Cephalosporin use.   . Sulfa Antibiotics Swelling and Rash    Prescriptions Prior to Admission  Medication Sig Dispense Refill Last Dose  . acetaminophen (TYLENOL) 325 MG tablet Take 650 mg by mouth every 6 (six) hours as needed for moderate pain.   Taking  . DICLEGIS 10-10 MG TBEC TK 2 TS PO QD  3 Taking  . enoxaparin (LOVENOX) 60 MG/0.6ML injection Inject 0.6 mLs (60 mg total) into the skin daily. Do not start unless cleared by your gynecologist from a uterine bleeding standpoint. 30 Syringe 1   . LORazepam (ATIVAN) 0.5 MG tablet Take 0.5 mg by mouth 2 (two) times daily as needed for anxiety.    Not Taking  . Prenatal Vit-Fe Phos-FA-Omega (VITAFOL GUMMIES) 3.33-0.333-34.8 MG CHEW CHEW AND SWALLOW 1 GUMMY PO D  9 Taking   Review of Systems:  (+) edema, soreness in extremities, headache, numbness/tingling in face, nausea w/ vomiting (-) abdominal pain, bleeding   Physical Exam   BP 126/82   Pulse 97   Temp 98.2 F (36.8 C) (Oral)   Resp 18   Wt 74.5 kg (164 lb 4 oz)   LMP 03/25/2016   SpO2 99%   BMI 26.51 kg/m   General: General appearance -  alert, well appearing, and in no distress Chest - clear to auscultation, no wheezes, rales or rhonchi, symmetric air entry Heart - RRR; normal S1, S2, no m/r/g       Focused Gynecological Exam: examination not indicated   Labs: Results for orders placed or performed during the hospital encounter of 09/08/16 (from the past 24 hour(s))  Urinalysis, Routine w reflex microscopic   Collection Time: 09/08/16 12:36 PM  Result Value Ref Range   Color, Urine YELLOW YELLOW   APPearance CLEAR CLEAR   Specific Gravity, Urine 1.013 1.005 - 1.030   pH 5.0 5.0 - 8.0   Glucose, UA NEGATIVE NEGATIVE mg/dL   Hgb urine dipstick NEGATIVE  NEGATIVE   Bilirubin Urine NEGATIVE NEGATIVE   Ketones, ur NEGATIVE NEGATIVE mg/dL   Protein, ur NEGATIVE NEGATIVE mg/dL   Nitrite NEGATIVE NEGATIVE   Leukocytes, UA NEGATIVE NEGATIVE   Imaging Studies:  No results found.  Assessment: Patient Active Problem List   Diagnosis Date Noted  . 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 High risk pregnancy with high human chorionic gonadotropin (hCG) 05/06/2016   Patient is a [id number] who presents with swelling, headache, and tingling in her face in the setting of an elevated blood pressure reading at [facility]. Fetal NST has been Category I since presentation. Maternal blood pressures have been within normal range, with the exception of a single elevated diastolic pressure of 91. Proceed with preeclampsia workup to rule out.    Plan: - NST - CBC, CMP, Pr/Cr ratio - Urinalysis  Thurnell Lose

## 2016-09-08 NOTE — MAU Provider Note (Signed)
Chief Complaint:  Foot Swelling; Muscle Pain; Headache; and Hypertension   First Provider Initiated Contact with Patient 09/08/16 1318      HPI: Abigail James is a 31 y.o. [id number] at [redacted]w[redacted]d who presents to maternity admissions reporting swelling of her feet/ankles starting 3 days ago, headache starting today, and blood pressure of 140s/70s at [facility] today.  She reports she called her OB provider and was told to go to the hospital to be evaluated. She reports the swelling is less severe today than it was yesterday, that it took hours to resolve.  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  Her headache is unilateral, on the left side, associated with sensation of tingling of the left side of her head. It is not associated with photophobia but is associated with nausea. Her nausea and vomiting have been more severe in the last week, only mild before that time.  She has tried Tylenol for h/a, PO 650 mg last taken at 8:30 am which did not help. She has not tried any treatments for swelling, n/v, or blood pressure. She reports an elevated BP at a recent visit in the office that resolved when taken again.  She receives prenatal care in [place] because she was transferred there by [facility] but she is concerned about the drive from her home in [place] to [place] for appointments and especially when it is time for delivery.  She was transferred because she was high risk with a high hcg in early pregnancy and concern for molar pregnancy. [place] have been wnl.  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  She reports good fetal movement, denies LOF, vaginal bleeding, vaginal itching/burning, urinary symptoms, h/a, dizziness, n/v, or fever/chills.    HPI  Past Medical History: History reviewed. No pertinent past medical history.  Past obstetric  history: OB History  Gravida Para Term Preterm AB Living  4 2 1 1 1 2   SAB TAB Ectopic Multiple Live Births  1 0 0 0 2    # Outcome Date GA Lbr Len/2nd Weight Sex Delivery Anes PTL Lv  4 Current           3 SAB  [redacted]w[redacted]d         2 Preterm      Vag-Spont   LIV  1 Term      Vag-Spont   LIV      Past Surgical History: Past Surgical History:  Procedure Laterality Date  . APPENDECTOMY    . CHOLECYSTECTOMY    . DILATION AND CURETTAGE OF UTERUS    . FOOT SURGERY    . LEG SURGERY    . TYMPANOSTOMY TUBE PLACEMENT      Family History: History reviewed. No pertinent family history.  Social History: Social History  Substance Use Topics  . Smoking status: Former Smoker    Quit date: 04/08/2014  . Smokeless tobacco: Never Used  . Alcohol use Yes     Comment: occasional    Allergies:  Allergies  Allergen Reactions  . Ciprofloxacin Anaphylaxis  . Latex Swelling  . Clindamycin/Lincomycin     ? Reaction, - dizzy, facial tingling, nausea, severe chest pain  . Vancomycin Nausea And Vomiting  . Amoxicillin Rash  . Penicillins Rash    Has patient had a PCN reaction causing immediate rash, facial/tongue/throat swelling, SOB or  lightheadedness with hypotension: yes Has patient had a PCN reaction causing severe rash involving mucus membranes or skin necrosis:no Has patient had a PCN reaction that required hospitalization: unknown  Has patient had a PCN reaction occurring within the last 10 years: no If all of the above answers are "NO", then may proceed with Cephalosporin use.   . Sulfa Antibiotics Swelling and Rash    Meds:  No prescriptions prior to admission.    ROS:  Review of Systems  Constitutional: Negative for chills, fatigue and fever.  Respiratory: Negative for shortness of breath.   Cardiovascular: Positive for leg swelling. Negative for chest pain.  Gastrointestinal: Positive for nausea and vomiting.  Genitourinary: Negative for difficulty urinating, dysuria, flank  pain, pelvic pain, vaginal bleeding, vaginal discharge and vaginal pain.  Neurological: Positive for headaches. Negative for dizziness.  Psychiatric/Behavioral: Negative.      I have reviewed patient's Past Medical Hx, Surgical Hx, Family Hx, Social Hx, medications and allergies.   Physical Exam   Patient Vitals for the past 24 hrs:  BP Temp Temp src Pulse Resp SpO2 Weight  09/08/16 1606 124/70 - - - - - -  09/08/16 1415 116/73 - - 82 - - -  09/08/16 1400 123/82 - - 86 - - -  09/08/16 1345 129/80 - - 86 - - -  09/08/16 1330 127/79 - - 90 - - -  09/08/16 1315 (!) 126/91 - - 94 - - -  09/08/16 1300 126/82 - - 97 - - -  09/08/16 1247 127/81 - - 96 - - -  09/08/16 1234 132/74 98.2 F (36.8 C) Oral (!) 104 18 99 % 164 lb 4 oz (74.5 kg)   Constitutional: Well-developed, well-nourished female in no acute distress.  Cardiovascular: normal rate Respiratory: normal effort GI: Abd soft, non-tender, gravid appropriate for gestational age.  MS: Extremities nontender, +1 BLE, pitting on left leg only, normal ROM Neurologic: Alert and oriented x 4.  GU: Neg CVAT.     FHT:  Baseline 145 , moderate variability, accelerations present, no decelerations Contractions: None on toco or to palpation   Labs: Results for orders placed or performed during the hospital encounter of 09/08/16 (from the past 24 hour(s))  Urinalysis, Routine w reflex microscopic     Status: None   Collection Time: 09/08/16 12:36 PM  Result Value Ref Range   Color, Urine YELLOW YELLOW   APPearance CLEAR CLEAR   Specific Gravity, Urine 1.013 1.005 - 1.030   pH 5.0 5.0 - 8.0   Glucose, UA NEGATIVE NEGATIVE mg/dL   Hgb urine dipstick NEGATIVE NEGATIVE   Bilirubin Urine NEGATIVE NEGATIVE   Ketones, ur NEGATIVE NEGATIVE mg/dL   Protein, ur NEGATIVE NEGATIVE mg/dL   Nitrite NEGATIVE NEGATIVE   Leukocytes, UA NEGATIVE NEGATIVE  Protein / creatinine ratio, urine     Status: None   Collection Time: 09/08/16 12:36 PM   Result Value Ref Range   Creatinine, Urine 85.00 mg/dL   Total Protein, Urine 8 mg/dL   Protein Creatinine Ratio 0.09 0.00 - 0.15 mg/mg[Cre]  CBC     Status: Abnormal   Collection Time: 09/08/16  1:39 PM  Result Value Ref Range   WBC 12.8 (H) 4.0 - 10.5 K/uL   RBC 4.58 3.87 - 5.11 MIL/uL   Hemoglobin 12.6 12.0 - 15.0 g/dL   HCT 28.436.1 13.236.0 - 44.046.0 %   MCV 78.8 78.0 - 100.0 fL   MCH 27.5 26.0 - 34.0 pg   MCHC 34.9 30.0 -  36.0 g/dL   RDW 29.5 62.1 - 30.8 %   Platelets 195 150 - 400 K/uL  Comprehensive metabolic panel     Status: Abnormal   Collection Time: 09/08/16  1:39 PM  Result Value Ref Range   Sodium 134 (L) 135 - 145 mmol/L   Potassium 3.3 (L) 3.5 - 5.1 mmol/L   Chloride 107 101 - 111 mmol/L   CO2 20 (L) 22 - 32 mmol/L   Glucose, Bld 83 65 - 99 mg/dL   BUN <5 (L) 6 - 20 mg/dL   Creatinine, Ser 6.57 (L) 0.44 - 1.00 mg/dL   Calcium 8.3 (L) 8.9 - 10.3 mg/dL   Total Protein 6.4 (L) 6.5 - 8.1 g/dL   Albumin 3.1 (L) 3.5 - 5.0 g/dL   AST 16 15 - 41 U/L   ALT 10 (L) 14 - 54 U/L   Alkaline Phosphatase 62 38 - 126 U/L   Total Bilirubin 0.4 0.3 - 1.2 mg/dL   GFR calc non Af Amer >60 >60 mL/min   GFR calc Af Amer >60 >60 mL/min   Anion gap 7 5 - 15   --/--/O POS (02/06 1917)  Imaging:  No results found.  MAU Course/MDM: UA, CBC, CMP, P/C ratio NST reviewed and reactive With normal BP except for 1 borderline, and normal preeclampsia labs today, no evidence of preeclampsia Fioricet 5/325 x 2 tabs given in MAU with improvement in h/a symptoms Discussed elevating legs, decreasing salt intake and increasing PO fluids, and wearing support socks/stockings for edema Rx for Fioricet 5/325, take 1-2  Q 6 hours PRN h/a Message sent to transfer pt to [facility] office as preferred Preeclampsia precautions reviewed Return to MAU as needed for emergencies Prenatal records requested Pt stable at time of discharge.   Assessment: 1. Headache in pregnancy, antepartum, second trimester    2. High risk pregnancy with high human chorionic gonadotropin (hCG)   3. Edema during pregnancy in second trimester   4. Nausea and vomiting during pregnancy     Plan: Discharge home with preeclampsia precautions Labor precautions and fetal kick counts Follow-up Information    [facility] Follow up.   Specialty:  Obstetrics and Gynecology Why:  The office will call you to set up an appointment. Return to MAU as needed for emergencies. Contact information: [address] [place] [address] [phone]         Allergies as of 09/08/2016      Reactions   Ciprofloxacin Anaphylaxis   Latex Swelling   Clindamycin/lincomycin    ? Reaction, - dizzy, facial tingling, nausea, severe chest pain   Vancomycin Nausea And Vomiting   Amoxicillin Rash   Penicillins Rash   Has patient had a PCN reaction causing immediate rash, facial/tongue/throat swelling, SOB or lightheadedness with hypotension: yes Has patient had a PCN reaction causing severe rash involving mucus membranes or skin necrosis:no Has patient had a PCN reaction that required hospitalization: unknown  Has patient had a PCN reaction occurring within the last 10 years: no If all of the above answers are "NO", then may proceed with Cephalosporin use.   Sulfa Antibiotics Swelling, Rash      Medication List    STOP taking these medications   acetaminophen 325 MG tablet Commonly known as:  TYLENOL   enoxaparin 60 MG/0.6ML injection Commonly known as:  LOVENOX     TAKE these medications   butalbital-acetaminophen-caffeine 50-325-40 MG tablet Commonly known  as:  FIORICET, ESGIC Take 1-2 tablets by mouth every 6 (six) hours as needed for headache.   ranitidine 150 MG tablet Commonly known as:  ZANTAC Take 150 mg by mouth 2 (two) times daily.   VITAFOL GUMMIES 3.33-0.333-34.8 MG Chew CHEW AND SWALLOW 1 GUMMY PO D       Sharen Counter Certified Nurse-Midwife 09/08/2016 9:18 PM

## 2016-09-08 NOTE — MAU Note (Signed)
+  headache--left side Rating pain 6/10 Tried tylenol with no relief; last took at 830 this morning (500mg )  +tingling in face +swelling bilateral feet and in face Started Friday evening  Denies epigastric pain.  Denies changes in vision   Denies LOF or vaginal bleeding.  +FM

## 2016-09-08 NOTE — MAU Note (Signed)
Feet were really swollen over weekend.  Legs are and underarms are sore, like she has been working out.  [facility] Had pharmacy check BP 148/74.  Baby has been kick a lot.  Face is tingly, has a headache,  Taking Tylenol- no relief

## 2016-09-26 ENCOUNTER — Encounter: Payer: Self-pay | Admitting: Family Medicine

## 2016-09-29 ENCOUNTER — Encounter: Payer: Self-pay | Admitting: *Deleted

## 2016-09-29 DIAGNOSIS — O0990 Supervision of high risk pregnancy, unspecified, unspecified trimester: Secondary | ICD-10-CM | POA: Insufficient documentation

## 2016-10-03 ENCOUNTER — Ambulatory Visit (INDEPENDENT_AMBULATORY_CARE_PROVIDER_SITE_OTHER): Payer: Medicaid Other | Admitting: Family Medicine

## 2016-10-03 ENCOUNTER — Encounter: Payer: Self-pay | Admitting: *Deleted

## 2016-10-03 ENCOUNTER — Encounter: Payer: Self-pay | Admitting: Family Medicine

## 2016-10-03 VITALS — BP 129/77 | HR 94 | Wt 164.0 lb

## 2016-10-03 DIAGNOSIS — O0990 Supervision of high risk pregnancy, unspecified, unspecified trimester: Secondary | ICD-10-CM

## 2016-10-03 DIAGNOSIS — Z8639 Personal history of other endocrine, nutritional and metabolic disease: Secondary | ICD-10-CM | POA: Insufficient documentation

## 2016-10-03 DIAGNOSIS — Z86711 Personal history of pulmonary embolism: Secondary | ICD-10-CM

## 2016-10-03 DIAGNOSIS — O0992 Supervision of high risk pregnancy, unspecified, second trimester: Secondary | ICD-10-CM

## 2016-10-03 DIAGNOSIS — D892 Hypergammaglobulinemia, unspecified: Secondary | ICD-10-CM

## 2016-10-03 MED ORDER — ENOXAPARIN SODIUM 60 MG/0.6ML ~~LOC~~ SOLN: 60.0000 mg | SUBCUTANEOUS | 4 refills | Status: DC
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
## 2016-10-22 ENCOUNTER — Ambulatory Visit (INDEPENDENT_AMBULATORY_CARE_PROVIDER_SITE_OTHER): Payer: Medicaid Other | Admitting: Obstetrics & Gynecology

## 2016-10-22 VITALS — BP 134/75 | HR 100 | Wt 170.0 lb

## 2016-10-22 DIAGNOSIS — Z23 Encounter for immunization: Secondary | ICD-10-CM

## 2016-10-22 DIAGNOSIS — O0993 Supervision of high risk pregnancy, unspecified, third trimester: Secondary | ICD-10-CM

## 2016-10-22 DIAGNOSIS — D892 Hypergammaglobulinemia, unspecified: Secondary | ICD-10-CM

## 2016-10-22 DIAGNOSIS — Z8639 Personal history of other endocrine, nutritional and metabolic disease: Secondary | ICD-10-CM

## 2016-10-22 DIAGNOSIS — Z86711 Personal history of pulmonary embolism: Secondary | ICD-10-CM

## 2016-10-22 DIAGNOSIS — O0990 Supervision of high risk pregnancy, unspecified, unspecified trimester: Secondary | ICD-10-CM

## 2016-10-22 NOTE — Progress Notes (Signed)
   PRENATAL VISIT NOTE  Subjective:  Abigail James is a 31 y.o. [phone] at 6377w1d being seen today for ongoing prenatal care.  She is currently monitored for the following issues for this high-risk pregnancy and has Cryofibrinogenemia; Supervision of high risk pregnancy, antepartum; History of hyperthyroidism; and Personal history of pulmonary embolism on her problem list.  Patient reports pain in her right hip with rising. Wants to know when she can stop working. I explained to her that unless there is a medical problem, she will work up to her due date.Contractions: Not present. Vag. Bleeding: None.  Movement: Present. Denies leaking of fluid.   The following portions of the patient's history were reviewed and updated as appropriate: allergies, current medications, past family history, past medical history, past social history, past surgical history and problem list. Problem list updated.  Objective:   Vitals:   10/22/16 1521  BP: (!) 144/75  Pulse: (!) 104  Weight: 170 lb (77.1 kg)    Fetal Status:     Movement: Present     General:  Alert, oriented and cooperative. Patient is in no acute distress.  Skin: Skin is warm and dry. No rash noted.   Cardiovascular: Normal heart rate noted  Respiratory: Normal respiratory effort, no problems with respiration noted  Abdomen: Soft, gravid, appropriate for gestational age.  Pain/Pressure: Absent     Pelvic: Cervical exam deferred        Extremities: Normal range of motion.  Edema: Trace  Mental Status:  Normal mood and affect. Normal behavior. Normal judgment and thought content.   Assessment and Plan:  Pregnancy: [id number] at 1477w1d  1. Supervision of high risk pregnancy, antepartum Tdap today  Pt wants note for work. She was instructed to bring in her FMLA paperwork dn we can put her due date on it  No indications to be taken out of work early    2. Personal history of pulmonary embolism On Lovenox- pt reports that she has only been  using for ~1 1/2 weeks. No evidence of getting shots on abd.  3. History of hyperthyroidism  4. Cryofibrinogenemia On Lovenox currently  Preterm labor symptoms and general obstetric precautions including but not limited to vaginal bleeding, contractions, leaking of fluid and fetal movement were reviewed in detail with the patient. Please refer to After Visit Summary for other counseling recommendations.  Return in about 2 weeks (around 11/05/2016).   Willodean Rosenthalarolyn Harraway-Smith, MD

## 2016-11-05 ENCOUNTER — Ambulatory Visit (INDEPENDENT_AMBULATORY_CARE_PROVIDER_SITE_OTHER): Payer: Medicaid Other | Admitting: Obstetrics & Gynecology

## 2016-11-05 VITALS — BP 134/81 | HR 95 | Wt 169.0 lb

## 2016-11-05 DIAGNOSIS — Z86711 Personal history of pulmonary embolism: Secondary | ICD-10-CM

## 2016-11-05 DIAGNOSIS — O0993 Supervision of high risk pregnancy, unspecified, third trimester: Secondary | ICD-10-CM

## 2016-11-05 DIAGNOSIS — Z8639 Personal history of other endocrine, nutritional and metabolic disease: Secondary | ICD-10-CM

## 2016-11-05 DIAGNOSIS — O0990 Supervision of high risk pregnancy, unspecified, unspecified trimester: Secondary | ICD-10-CM

## 2016-11-05 DIAGNOSIS — D892 Hypergammaglobulinemia, unspecified: Secondary | ICD-10-CM

## 2016-11-07 NOTE — Progress Notes (Signed)
   PRENATAL VISIT NOTE  Subjective:  Abigail James is a 31 y.o. [phone] at 3262w3d being seen today for ongoing prenatal care.  She is currently monitored for the following issues for this high-risk pregnancy and has Cryofibrinogenemia; Supervision of high risk pregnancy, antepartum; History of hyperthyroidism; and Personal history of pulmonary embolism on her problem list.  Patient reports pt reports pain in her back adn pain with sitting and standing. .  Contractions: Irregular. Vag. Bleeding: None.  Movement: Present. Denies leaking of fluid.   The following portions of the patient's history were reviewed and updated as appropriate: allergies, current medications, past family history, past medical history, past social history, past surgical history and problem list. Problem list updated.  Objective:   Vitals:   11/06/16 0810 11/06/16 0811  BP: 137/87 134/81  Pulse: (!) 102 95  Weight: 169 lb (76.7 kg)     Fetal Status: Fetal Heart Rate (bpm): 130 Fundal Height: 34 cm Movement: Present     General:  Alert, oriented and cooperative. Patient is in no acute distress.  Skin: Skin is warm and dry. No rash noted.   Cardiovascular: Normal heart rate noted  Respiratory: Normal respiratory effort, no problems with respiration noted  Abdomen: Soft, gravid, appropriate for gestational age.  Pain/Pressure: Present     Pelvic: Cervical exam deferred        Extremities: Normal range of motion.  Edema: Mild pitting, slight indentation  Mental Status:  Normal mood and affect. Normal behavior. Normal judgment and thought content.   Assessment and Plan:  Pregnancy: [id number] at 3262w3d  1. Supervision of high risk pregnancy, antepartum Pt requests to be taken out of work . I cannot see any medical condition which would prohibit work at this time. Pt reports that the maternity belt did not work to alleviate her sx.   2. Personal history of pulmonary embolism Pt currently Rx'd Lovenox. I am worried  that pt is not taking meds   There are no injection site findings and pt cannot answer specific questions about Lovenox use however, she reports taking the meds.  3. History of hyperthyroidism stable  4. Cryofibrinogenemia See above  Preterm labor symptoms and general obstetric precautions including but not limited to vaginal bleeding, contractions, leaking of fluid and fetal movement were reviewed in detail with the patient. Please refer to After Visit Summary for other counseling recommendations.  Return in about 2 weeks (around 11/19/2016).   Willodean Rosenthalarolyn Harraway-Smith, MD

## 2016-11-21 ENCOUNTER — Ambulatory Visit (INDEPENDENT_AMBULATORY_CARE_PROVIDER_SITE_OTHER): Payer: Medicaid Other | Admitting: Family Medicine

## 2016-11-21 VITALS — BP 133/79 | HR 102 | Wt 172.0 lb

## 2016-11-21 DIAGNOSIS — Z86711 Personal history of pulmonary embolism: Secondary | ICD-10-CM

## 2016-11-21 DIAGNOSIS — O0990 Supervision of high risk pregnancy, unspecified, unspecified trimester: Secondary | ICD-10-CM

## 2016-11-21 DIAGNOSIS — D892 Hypergammaglobulinemia, unspecified: Secondary | ICD-10-CM

## 2016-11-21 DIAGNOSIS — Z8639 Personal history of other endocrine, nutritional and metabolic disease: Secondary | ICD-10-CM

## 2016-11-21 DIAGNOSIS — O0993 Supervision of high risk pregnancy, unspecified, third trimester: Secondary | ICD-10-CM

## 2016-11-21 NOTE — Progress Notes (Signed)
   PRENATAL VISIT NOTE  Subjective:  Abigail James is a 31 y.o. [phone] at [redacted]w[redacted]d being seen today for ongoing prenatal care.  She is currently monitored for the following issues for this high-risk pregnancy and has Cryofibrinogenemia; Supervision of high risk pregnancy, antepartum; History of hyperthyroidism; and Personal history of pulmonary embolism on her problem list.  Patient reports pelvic pain, pubic sympasis pain.  Contractions: Irritability. Vag. Bleeding: None.  Movement: Present. Denies leaking of fluid.   The following portions of the patient's history were reviewed and updated as appropriate: allergies, current medications, past family history, past medical history, past social history, past surgical history and problem list. Problem list updated.  Objective:   Vitals:   11/21/16 0846  BP: 133/79  Pulse: (!) 102  Weight: 172 lb (78 kg)    Fetal Status: Fetal Heart Rate (bpm): 140   Movement: Present     General:  Alert, oriented and cooperative. Patient is in no acute distress.  Skin: Skin is warm and dry. No rash noted.   Cardiovascular: Normal heart rate noted  Respiratory: Normal respiratory effort, no problems with respiration noted  Abdomen: Soft, gravid, appropriate for gestational age.  Pain/Pressure: Present     Pelvic: Cervical exam deferred        Extremities: Normal range of motion.  Edema: Trace  Mental Status:  Normal mood and affect. Normal behavior. Normal judgment and thought content.   Assessment and Plan:  Pregnancy: [id number] at [redacted]w[redacted]d  1. Supervision of high risk pregnancy, antepartum FHT and FH normal. Pt requesting out of work - I discussed with her that there is not a medical reason for her to get out of work.  2. History of hyperthyroidism  3. Personal history of pulmonary embolism Patient states she is compliant with lovenox  4. Cryofibrinogenemia  Preterm labor symptoms and general obstetric precautions including but not limited to  vaginal bleeding, contractions, leaking of fluid and fetal movement were reviewed in detail with the patient. Please refer to After Visit Summary for other counseling recommendations.  No Follow-up on file.   Levie Heritage, DO

## 2016-11-27 ENCOUNTER — Inpatient Hospital Stay (HOSPITAL_COMMUNITY)
Admission: AD | Admit: 2016-11-27 | Discharge: 2016-11-27 | Disposition: A | Discharge status: Home / Self Care | Payer: Medicaid Other | Source: Ambulatory Visit | Attending: Obstetrics & Gynecology | Admitting: Obstetrics & Gynecology

## 2016-11-27 ENCOUNTER — Encounter (HOSPITAL_COMMUNITY): Payer: Self-pay | Admitting: *Deleted

## 2016-11-27 DIAGNOSIS — Z86718 Personal history of other venous thrombosis and embolism: Secondary | ICD-10-CM | POA: Diagnosis not present

## 2016-11-27 DIAGNOSIS — Z79899 Other long term (current) drug therapy: Secondary | ICD-10-CM | POA: Insufficient documentation

## 2016-11-27 DIAGNOSIS — R102 Pelvic and perineal pain: Secondary | ICD-10-CM | POA: Diagnosis not present

## 2016-11-27 DIAGNOSIS — D892 Hypergammaglobulinemia, unspecified: Secondary | ICD-10-CM | POA: Insufficient documentation

## 2016-11-27 DIAGNOSIS — N949 Unspecified condition associated with female genital organs and menstrual cycle: Secondary | ICD-10-CM

## 2016-11-27 DIAGNOSIS — Z87891 Personal history of nicotine dependence: Secondary | ICD-10-CM | POA: Insufficient documentation

## 2016-11-27 DIAGNOSIS — Z880 Allergy status to penicillin: Secondary | ICD-10-CM | POA: Diagnosis not present

## 2016-11-27 DIAGNOSIS — M549 Dorsalgia, unspecified: Secondary | ICD-10-CM | POA: Insufficient documentation

## 2016-11-27 DIAGNOSIS — O99113 Other diseases of the blood and blood-forming organs and certain disorders involving the immune mechanism complicating pregnancy, third trimester: Secondary | ICD-10-CM | POA: Insufficient documentation

## 2016-11-27 DIAGNOSIS — O26893 Other specified pregnancy related conditions, third trimester: Secondary | ICD-10-CM | POA: Insufficient documentation

## 2016-11-27 DIAGNOSIS — Z3A35 35 weeks gestation of pregnancy: Secondary | ICD-10-CM | POA: Insufficient documentation

## 2016-11-27 DIAGNOSIS — Z881 Allergy status to other antibiotic agents status: Secondary | ICD-10-CM | POA: Diagnosis not present

## 2016-11-27 DIAGNOSIS — Z3689 Encounter for other specified antenatal screening: Secondary | ICD-10-CM

## 2016-11-27 HISTORY — DX: Acute embolism and thrombosis of unspecified deep veins of unspecified lower extremity: I82.409

## 2016-11-27 LAB — URINALYSIS, ROUTINE W REFLEX MICROSCOPIC
Bilirubin Urine: NEGATIVE
Glucose, UA: NEGATIVE mg/dL
Hgb urine dipstick: NEGATIVE
KETONES UR: NEGATIVE mg/dL
LEUKOCYTES UA: NEGATIVE
NITRITE: NEGATIVE
PROTEIN: NEGATIVE mg/dL
Specific Gravity, Urine: 1.005 (ref 1.005–1.030)
pH: 8 (ref 5.0–8.0)

## 2016-11-27 NOTE — Discharge Instructions (Signed)

## 2016-11-27 NOTE — MAU Note (Signed)
Urine in the lab  

## 2016-11-27 NOTE — MAU Provider Note (Signed)
History     CSN: [id number]  Arrival date and time: 11/27/16 2039   First Provider Initiated Contact with Patient 11/27/16 2124      No chief complaint on file.  [id number] @35 .2 wks here with LAP and back pain. Sx started around 7pm when she woke from a nap. She felt the pain about 4 times. She thought it was ctx. She also felt the need for BM but couldn't. Reports good FM. No VB or LOF.    OB History    Gravida Para Term Preterm AB Living   4 2 1 1 1 2    SAB TAB Ectopic Multiple Live Births   1 0 0 0 2      Past Medical History:  Diagnosis Date  . DVT (deep venous thrombosis) (HCC)     Past Surgical History:  Procedure Laterality Date  . APPENDECTOMY    . CHOLECYSTECTOMY    . DILATION AND CURETTAGE OF UTERUS    . FOOT SURGERY    . LEG SURGERY    . TYMPANOSTOMY TUBE PLACEMENT      Family History  Problem Relation Age of Onset  . Hypertension Father   . Hypertension Brother   . Hypertension Maternal Uncle   . Cancer Maternal Grandmother   . Hypertension Paternal Grandmother   . Diabetes Paternal Grandmother   . Cancer Paternal Grandmother     Social History  Substance Use Topics  . Smoking status: Former Smoker    Quit date: 04/08/2014  . Smokeless tobacco: Never Used  . Alcohol use Yes     Comment: occasional    Allergies:  Allergies  Allergen Reactions  . Ciprofloxacin Anaphylaxis  . Latex Swelling  . Clindamycin/Lincomycin     ? Reaction, - dizzy, facial tingling, nausea, severe chest pain  . Vancomycin Nausea And Vomiting  . Amoxicillin Rash  . Penicillins Rash    Has patient had a PCN reaction causing immediate rash, facial/tongue/throat swelling, SOB or lightheadedness with hypotension: yes Has patient had a PCN reaction causing severe rash involving mucus membranes or skin necrosis:no Has patient had a PCN reaction that required hospitalization: unknown  Has patient had a PCN reaction occurring within the last 10 years: no If all of the above  answers are "NO", then may proceed with Cephalosporin use.   . Sulfa Antibiotics Swelling and Rash    Prescriptions Prior to Admission  Medication Sig Dispense Refill Last Dose  . butalbital-acetaminophen-caffeine (FIORICET, ESGIC) 50-325-40 MG tablet Take 1-2 tablets by mouth every 6 (six) hours as needed for headache. 20 tablet 0 Taking  . enoxaparin (LOVENOX) 60 MG/0.6ML injection Inject 60 mg into the skin.   11/26/2016 at Unknown time  . Prenatal Vit-Fe Phos-FA-Omega (VITAFOL GUMMIES) 3.33-0.333-34.8 MG CHEW CHEW AND SWALLOW 1 GUMMY PO D  9 Past Week at Unknown time  . ranitidine (ZANTAC) 150 MG tablet Take 150 mg by mouth 2 (two) times daily.    11/27/2016 at Unknown time  . enoxaparin (LOVENOX) 60 MG/0.6ML injection Inject 0.6 mLs (60 mg total) into the skin daily. 30 Syringe 4 Taking    Review of Systems  Gastrointestinal: Negative for abdominal pain.  Genitourinary: Positive for pelvic pain. Negative for vaginal bleeding and vaginal discharge.  Musculoskeletal: Positive for back pain.   Physical Exam   Blood pressure 129/80, pulse (!) 106, temperature 98.4 F (36.9 C), temperature source Oral, resp. rate 20, height 5\' 6"  (1.676 m), weight 174 lb (78.9 kg), last menstrual period 03/25/2016, SpO2  96 %.  Physical Exam  Constitutional: She is oriented to person, place, and time. She appears well-developed and well-nourished. No distress.  HENT:  Head: Normocephalic and atraumatic.  Neck: Normal range of motion.  Respiratory: Effort normal. No respiratory distress.  GI: Soft. She exhibits no distension. There is no tenderness.  gravid  Genitourinary:  Genitourinary Comments: Cervix closed/thick  Musculoskeletal: Normal range of motion.  Neurological: She is alert and oriented to person, place, and time.  EFM: 135 bpm, mod variability, + accels, no decels Toco: rare  Results for orders placed or performed during the hospital encounter of 11/27/16 (from the past 24 hour(s))   Urinalysis, Routine w reflex microscopic     Status: Abnormal   Collection Time: 11/27/16  8:51 PM  Result Value Ref Range   Color, Urine STRAW (A) YELLOW   APPearance CLEAR CLEAR   Specific Gravity, Urine 1.005 1.005 - 1.030   pH 8.0 5.0 - 8.0   Glucose, UA NEGATIVE NEGATIVE mg/dL   Hgb urine dipstick NEGATIVE NEGATIVE   Bilirubin Urine NEGATIVE NEGATIVE   Ketones, ur NEGATIVE NEGATIVE mg/dL   Protein, ur NEGATIVE NEGATIVE mg/dL   Nitrite NEGATIVE NEGATIVE   Leukocytes, UA NEGATIVE NEGATIVE    MAU Course  Procedures  MDM No evidence of PTL. Pain likely d/t fetal engagement and late gestation. Discussed comfort measures. Stable for discharge home.  Assessment and Plan   1. [redacted] weeks gestation of pregnancy   2. Cryofibrinogenemia   3. NST (non-stress test) reactive   4. Pelvic pressure in pregnancy, antepartum, third trimester    Discharge home Follow up in OB office next week as scheduled PTL precautions  Allergies as of 11/27/2016      Reactions   Ciprofloxacin Anaphylaxis   Latex Swelling   Clindamycin/lincomycin    ? Reaction, - dizzy, facial tingling, nausea, severe chest pain   Vancomycin Nausea And Vomiting   Amoxicillin Rash   Penicillins Rash   Has patient had a PCN reaction causing immediate rash, facial/tongue/throat swelling, SOB or lightheadedness with hypotension: yes Has patient had a PCN reaction causing severe rash involving mucus membranes or skin necrosis:no Has patient had a PCN reaction that required hospitalization: unknown  Has patient had a PCN reaction occurring within the last 10 years: no If all of the above answers are "NO", then may proceed with Cephalosporin use.   Sulfa Antibiotics Swelling, Rash      Medication List    TAKE these medications   butalbital-acetaminophen-caffeine 50-325-40 MG tablet Commonly known as:  FIORICET, ESGIC Take 1-2 tablets by mouth every 6 (six) hours as needed for headache.   enoxaparin 60 MG/0.6ML  injection Commonly known as:  LOVENOX Inject 60 mg into the skin.   enoxaparin 60 MG/0.6ML injection Commonly known as:  LOVENOX Inject 0.6 mLs (60 mg total) into the skin daily.   ranitidine 150 MG tablet Commonly known as:  ZANTAC Take 150 mg by mouth 2 (two) times daily.   VITAFOL GUMMIES 3.33-0.333-34.8 MG Chew CHEW AND SWALLOW 1 GUMMY PO D            Discharge Care Instructions        Start     Ordered   11/27/16 0000  Discharge patient    Question Answer Comment  Discharge disposition 01-Home or Self Care   Discharge patient date 11/27/2016      11/27/16 2135    [facility], CNM 11/27/2016, 9:33 PM

## 2016-11-27 NOTE — MAU Note (Signed)
PT  SAYS   VE  1-2   CM  ON Friday.     UC  STRONG TONIGHT -  7PM.   DENIES HSV AND  MRSA.

## 2016-12-04 ENCOUNTER — Ambulatory Visit (INDEPENDENT_AMBULATORY_CARE_PROVIDER_SITE_OTHER): Payer: Medicaid Other | Admitting: Family Medicine

## 2016-12-04 ENCOUNTER — Other Ambulatory Visit (HOSPITAL_COMMUNITY)
Admission: RE | Admit: 2016-12-04 | Discharge: 2016-12-04 | Disposition: A | Discharge status: Home / Self Care | Payer: Medicaid Other | Source: Ambulatory Visit | Attending: Family Medicine | Admitting: Family Medicine

## 2016-12-04 VITALS — BP 126/88 | HR 93 | Wt 174.0 lb

## 2016-12-04 DIAGNOSIS — Z3490 Encounter for supervision of normal pregnancy, unspecified, unspecified trimester: Secondary | ICD-10-CM | POA: Diagnosis present

## 2016-12-04 DIAGNOSIS — O0990 Supervision of high risk pregnancy, unspecified, unspecified trimester: Secondary | ICD-10-CM

## 2016-12-04 DIAGNOSIS — Z86711 Personal history of pulmonary embolism: Secondary | ICD-10-CM

## 2016-12-04 DIAGNOSIS — Z3493 Encounter for supervision of normal pregnancy, unspecified, third trimester: Secondary | ICD-10-CM | POA: Diagnosis not present

## 2016-12-04 DIAGNOSIS — D892 Hypergammaglobulinemia, unspecified: Secondary | ICD-10-CM

## 2016-12-04 DIAGNOSIS — O0993 Supervision of high risk pregnancy, unspecified, third trimester: Secondary | ICD-10-CM

## 2016-12-04 DIAGNOSIS — Z8639 Personal history of other endocrine, nutritional and metabolic disease: Secondary | ICD-10-CM

## 2016-12-04 LAB — OB RESULTS CONSOLE GBS: STREP GROUP B AG: NEGATIVE

## 2016-12-04 MED ORDER — ENOXAPARIN SODIUM 60 MG/0.6ML ~~LOC~~ SOLN: 60.0000 mg | SUBCUTANEOUS | 3 refills | Status: DC

## 2016-12-04 NOTE — Progress Notes (Signed)
   PRENATAL VISIT NOTE  Subjective:  Elvin Somber M Launer is a 31 y.o. [phone] at 1287w2d being seen today for ongoing prenatal care.  She is currently monitored for the following issues for this high-risk pregnancy and has Cryofibrinogenemia; Supervision of high risk pregnancy, antepartum; History of hyperthyroidism; and Personal history of pulmonary embolism on her problem list.  Patient reports SOB when laying down flat.  Contractions: Irregular. Vag. Bleeding: None.  Movement: Present. Denies leaking of fluid.   The following portions of the patient's history were reviewed and updated as appropriate: allergies, current medications, past family history, past medical history, past social history, past surgical history and problem list. Problem list updated.  Objective:   Vitals:   12/04/16 0902  BP: 126/88  Pulse: 93  Weight: 174 lb (78.9 kg)    Fetal Status:     Movement: Present     General:  Alert, oriented and cooperative. Patient is in no acute distress.  Skin: Skin is warm and dry. No rash noted.   Cardiovascular: Normal heart rate noted  Respiratory: Normal respiratory effort, no problems with respiration noted  Abdomen: Soft, gravid, appropriate for gestational age.  Pain/Pressure: Present     Pelvic: Cervical exam performed        Extremities: Normal range of motion.  Edema: Trace  Mental Status:  Normal mood and affect. Normal behavior. Normal judgment and thought content.   Assessment and Plan:  Pregnancy: [id number] at 6187w2d  1. Prenatal care, antepartum - Culture, beta strep (group b only) - [facility]Chlamydia probe amp [facility]  2. Supervision of high risk pregnancy, antepartum Recommended elevating head of bed so she can breath better  3. History of hyperthyroidism  4. Personal history of pulmonary embolism Continue lovenox. Pt states she is compliant. No bruising on abdomen  5. Cryofibrinogenemia   Preterm labor symptoms and general obstetric  precautions including but not limited to vaginal bleeding, contractions, leaking of fluid and fetal movement were reviewed in detail with the patient. Please refer to After Visit Summary for other counseling recommendations.  No Follow-up on file.   Levie HeritageJacob J Hermenia Fritcher, DO

## 2016-12-05 LAB — GC/CHLAMYDIA PROBE AMP (~~LOC~~) NOT AT ARMC
CHLAMYDIA, DNA PROBE: NEGATIVE
Neisseria Gonorrhea: NEGATIVE

## 2016-12-08 LAB — CULTURE, BETA STREP (GROUP B ONLY): STREP GP B CULTURE: NEGATIVE

## 2016-12-11 ENCOUNTER — Ambulatory Visit (INDEPENDENT_AMBULATORY_CARE_PROVIDER_SITE_OTHER): Payer: Medicaid Other | Admitting: Family Medicine

## 2016-12-11 VITALS — BP 132/73 | HR 116 | Wt 175.0 lb

## 2016-12-11 DIAGNOSIS — D892 Hypergammaglobulinemia, unspecified: Secondary | ICD-10-CM

## 2016-12-11 DIAGNOSIS — O0990 Supervision of high risk pregnancy, unspecified, unspecified trimester: Secondary | ICD-10-CM

## 2016-12-11 DIAGNOSIS — Z86711 Personal history of pulmonary embolism: Secondary | ICD-10-CM

## 2016-12-11 DIAGNOSIS — O0993 Supervision of high risk pregnancy, unspecified, third trimester: Secondary | ICD-10-CM

## 2016-12-11 NOTE — Progress Notes (Signed)
   PRENATAL VISIT NOTE  Subjective:  Abigail James is a 31 y.o. [id number] at 6815w2d being seen today for ongoing prenatal care.  She is currently monitored for the following issues for this high-risk pregnancy and has Cryofibrinogenemia; Supervision of high risk pregnancy, antepartum; History of hyperthyroidism; and Personal history of pulmonary embolism on her problem list.  Patient reports no complaints.   . Vag. Bleeding: None.  Movement: Present. Denies leaking of fluid.   The following portions of the patient's history were reviewed and updated as appropriate: allergies, current medications, past family history, past medical history, past social history, past surgical history and problem list. Problem list updated.  Objective:   Vitals:   12/11/16 1033  BP: 132/73  Pulse: (!) 116  Weight: 175 lb (79.4 kg)    Fetal Status: Fetal Heart Rate (bpm): 150 Fundal Height: 37 cm Movement: Present  Presentation: Vertex  General:  Alert, oriented and cooperative. Patient is in no acute distress.  Skin: Skin is warm and dry. No rash noted.   Cardiovascular: Normal heart rate noted  Respiratory: Normal respiratory effort, no problems with respiration noted  Abdomen: Soft, gravid, appropriate for gestational age.  Pain/Pressure: Present     Pelvic: Cervical exam performed Dilation: 2 Effacement (%): 60 Station: -3  Extremities: Normal range of motion.  Edema: Trace  Mental Status:  Normal mood and affect. Normal behavior. Normal judgment and thought content.   Assessment and Plan:  Pregnancy: [id number] at 7215w2d  1. Supervision of high risk pregnancy, antepartum FHT and FH normal  2. Cryofibrinogenemia  3. Personal history of pulmonary embolism Cont lovenox  Term labor symptoms and general obstetric precautions including but not limited to vaginal bleeding, contractions, leaking of fluid and fetal movement were reviewed in detail with the patient. Please refer to After Visit Summary for  other counseling recommendations.  Return in about 1 week (around 12/18/2016) for OB f/u.   Levie HeritageJacob J Haven Pylant, DO

## 2016-12-17 ENCOUNTER — Ambulatory Visit (INDEPENDENT_AMBULATORY_CARE_PROVIDER_SITE_OTHER): Payer: Medicaid Other | Admitting: Obstetrics & Gynecology

## 2016-12-17 ENCOUNTER — Encounter (HOSPITAL_COMMUNITY): Payer: Self-pay | Admitting: *Deleted

## 2016-12-17 ENCOUNTER — Inpatient Hospital Stay (HOSPITAL_COMMUNITY)
Admission: AD | Admit: 2016-12-17 | Discharge: 2016-12-22 | DRG: 765 | Disposition: A | Discharge status: Home / Self Care | Payer: Medicaid Other | Source: Ambulatory Visit | Attending: Obstetrics & Gynecology | Admitting: Obstetrics & Gynecology

## 2016-12-17 VITALS — BP 141/89 | HR 99 | Wt 179.0 lb

## 2016-12-17 DIAGNOSIS — D892 Hypergammaglobulinemia, unspecified: Secondary | ICD-10-CM

## 2016-12-17 DIAGNOSIS — O163 Unspecified maternal hypertension, third trimester: Secondary | ICD-10-CM

## 2016-12-17 DIAGNOSIS — Z3A38 38 weeks gestation of pregnancy: Secondary | ICD-10-CM

## 2016-12-17 DIAGNOSIS — D6859 Other primary thrombophilia: Secondary | ICD-10-CM | POA: Diagnosis present

## 2016-12-17 DIAGNOSIS — Z87891 Personal history of nicotine dependence: Secondary | ICD-10-CM | POA: Diagnosis not present

## 2016-12-17 DIAGNOSIS — O1404 Mild to moderate pre-eclampsia, complicating childbirth: Secondary | ICD-10-CM | POA: Diagnosis present

## 2016-12-17 DIAGNOSIS — Z8639 Personal history of other endocrine, nutritional and metabolic disease: Secondary | ICD-10-CM

## 2016-12-17 DIAGNOSIS — Z880 Allergy status to penicillin: Secondary | ICD-10-CM | POA: Diagnosis not present

## 2016-12-17 DIAGNOSIS — Z86711 Personal history of pulmonary embolism: Secondary | ICD-10-CM

## 2016-12-17 DIAGNOSIS — Z7901 Long term (current) use of anticoagulants: Secondary | ICD-10-CM | POA: Diagnosis not present

## 2016-12-17 DIAGNOSIS — O9912 Other diseases of the blood and blood-forming organs and certain disorders involving the immune mechanism complicating childbirth: Secondary | ICD-10-CM | POA: Diagnosis present

## 2016-12-17 DIAGNOSIS — O324XX0 Maternal care for high head at term, not applicable or unspecified: Secondary | ICD-10-CM | POA: Diagnosis present

## 2016-12-17 DIAGNOSIS — O1413 Severe pre-eclampsia, third trimester: Secondary | ICD-10-CM

## 2016-12-17 DIAGNOSIS — O0990 Supervision of high risk pregnancy, unspecified, unspecified trimester: Secondary | ICD-10-CM

## 2016-12-17 DIAGNOSIS — O0993 Supervision of high risk pregnancy, unspecified, third trimester: Secondary | ICD-10-CM

## 2016-12-17 DIAGNOSIS — Z86718 Personal history of other venous thrombosis and embolism: Secondary | ICD-10-CM

## 2016-12-17 DIAGNOSIS — O1490 Unspecified pre-eclampsia, unspecified trimester: Secondary | ICD-10-CM | POA: Diagnosis present

## 2016-12-17 DIAGNOSIS — Z98891 History of uterine scar from previous surgery: Secondary | ICD-10-CM

## 2016-12-17 LAB — CBC
HEMATOCRIT: 35.4 % — AB (ref 36.0–46.0)
HEMOGLOBIN: 11.4 g/dL — AB (ref 12.0–15.0)
MCH: 22.6 pg — AB (ref 26.0–34.0)
MCHC: 32.2 g/dL (ref 30.0–36.0)
MCV: 70.1 fL — ABNORMAL LOW (ref 78.0–100.0)
Platelets: 168 10*3/uL (ref 150–400)
RBC: 5.05 MIL/uL (ref 3.87–5.11)
RDW: 15.8 % — AB (ref 11.5–15.5)
WBC: 15.9 10*3/uL — ABNORMAL HIGH (ref 4.0–10.5)

## 2016-12-17 LAB — COMPREHENSIVE METABOLIC PANEL
ALK PHOS: 100 U/L (ref 38–126)
ALT: 11 U/L — ABNORMAL LOW (ref 14–54)
ANION GAP: 11 (ref 5–15)
AST: 20 U/L (ref 15–41)
Albumin: 3 g/dL — ABNORMAL LOW (ref 3.5–5.0)
BILIRUBIN TOTAL: 0.9 mg/dL (ref 0.3–1.2)
BUN: 5 mg/dL — ABNORMAL LOW (ref 6–20)
CALCIUM: 9 mg/dL (ref 8.9–10.3)
CO2: 19 mmol/L — ABNORMAL LOW (ref 22–32)
Chloride: 106 mmol/L (ref 101–111)
Creatinine, Ser: 0.55 mg/dL (ref 0.44–1.00)
Glucose, Bld: 90 mg/dL (ref 65–99)
POTASSIUM: 3.3 mmol/L — AB (ref 3.5–5.1)
Sodium: 136 mmol/L (ref 135–145)
TOTAL PROTEIN: 6.4 g/dL — AB (ref 6.5–8.1)

## 2016-12-17 LAB — PROTEIN / CREATININE RATIO, URINE
CREATININE, URINE: 76 mg/dL
PROTEIN CREATININE RATIO: 0.11 mg/mg{creat} (ref 0.00–0.15)
TOTAL PROTEIN, URINE: 8 mg/dL

## 2016-12-17 LAB — TYPE AND SCREEN
ABO/RH(D): O POS
Antibody Screen: NEGATIVE

## 2016-12-17 MED ORDER — ACETAMINOPHEN 325 MG PO TABS: 650.0000 mg | ORAL_TABLET | ORAL | Status: DC | PRN

## 2016-12-17 MED ORDER — OXYTOCIN 40 UNITS IN LACTATED RINGERS INFUSION - SIMPLE MED
1.0000 m[IU]/min | INTRAVENOUS | Status: DC
  Administered 2016-12-18: 2 m[IU]/min via INTRAVENOUS
  Filled 2016-12-17: qty 1000

## 2016-12-17 MED ORDER — OXYCODONE-ACETAMINOPHEN 5-325 MG PO TABS
1.0000 | ORAL_TABLET | ORAL | Status: DC | PRN
  Filled 2016-12-17: qty 1

## 2016-12-17 MED ORDER — LIDOCAINE HCL (PF) 1 % IJ SOLN
30.0000 mL | INTRAMUSCULAR | Status: DC | PRN
  Filled 2016-12-17: qty 30

## 2016-12-17 MED ORDER — OXYTOCIN BOLUS FROM INFUSION: 500.0000 mL | Freq: Once | INTRAVENOUS | Status: DC

## 2016-12-17 MED ORDER — LACTATED RINGERS IV SOLN
INTRAVENOUS | Status: DC
  Administered 2016-12-17: 20:00:00 via INTRAVENOUS
  Administered 2016-12-18: 125 mL/h via INTRAVENOUS
  Administered 2016-12-18: 09:00:00 via INTRAVENOUS
  Administered 2016-12-18: 125 mL/h via INTRAVENOUS
  Administered 2016-12-18: 23:00:00 via INTRAVENOUS

## 2016-12-17 MED ORDER — OXYCODONE-ACETAMINOPHEN 5-325 MG PO TABS: 2.0000 | ORAL_TABLET | ORAL | Status: DC | PRN

## 2016-12-17 MED ORDER — OXYCODONE-ACETAMINOPHEN 5-325 MG PO TABS
1.0000 | ORAL_TABLET | Freq: Once | ORAL | Status: AC
  Administered 2016-12-17: 1 via ORAL

## 2016-12-17 MED ORDER — ZOLPIDEM TARTRATE 5 MG PO TABS: 5.0000 mg | ORAL_TABLET | Freq: Every evening | ORAL | Status: DC | PRN

## 2016-12-17 MED ORDER — TERBUTALINE SULFATE 1 MG/ML IJ SOLN: 0.2500 mg | Freq: Once | INTRAMUSCULAR | Status: DC | PRN

## 2016-12-17 MED ORDER — ONDANSETRON HCL 4 MG/2ML IJ SOLN: 4.0000 mg | Freq: Four times a day (QID) | INTRAMUSCULAR | Status: DC | PRN

## 2016-12-17 MED ORDER — SOD CITRATE-CITRIC ACID 500-334 MG/5ML PO SOLN
30.0000 mL | ORAL | Status: DC | PRN
  Administered 2016-12-19: 30 mL via ORAL
  Filled 2016-12-17 (×2): qty 15

## 2016-12-17 MED ORDER — FAMOTIDINE 20 MG PO TABS
20.0000 mg | ORAL_TABLET | Freq: Once | ORAL | Status: AC
  Administered 2016-12-17: 20 mg via ORAL
  Filled 2016-12-17: qty 1

## 2016-12-17 MED ORDER — MISOPROSTOL 25 MCG QUARTER TABLET
25.0000 ug | ORAL_TABLET | Freq: Once | ORAL | Status: AC
  Administered 2016-12-17: 25 ug via VAGINAL
  Filled 2016-12-17: qty 1

## 2016-12-17 MED ORDER — LACTATED RINGERS IV SOLN
500.0000 mL | INTRAVENOUS | Status: DC | PRN
  Administered 2016-12-18 (×2): 500 mL via INTRAVENOUS

## 2016-12-17 MED ORDER — OXYTOCIN 40 UNITS IN LACTATED RINGERS INFUSION - SIMPLE MED: 2.5000 [IU]/h | INTRAVENOUS | Status: DC

## 2016-12-17 NOTE — Progress Notes (Signed)
Headache since Friday.  BP recheck 140/89

## 2016-12-17 NOTE — Progress Notes (Signed)
   PRENATAL VISIT NOTE  Subjective:  Abigail James is a 31 y.o. [phone] at [redacted]w[redacted]d being seen today for ongoing prenatal care.  She is currently monitored for the following issues for this high-risk pregnancy and has Cryofibrinogenemia; Supervision of high risk pregnancy, antepartum; History of hyperthyroidism; and Personal history of pulmonary embolism on her problem list.  Patient reports headache. No vision changes. No RUQ pain.  Contractions: Not present. Vag. Bleeding: None.  Movement: Present. Denies leaking of fluid.   The following portions of the patient's history were reviewed and updated as appropriate: allergies, current medications, past family history, past medical history, past social history, past surgical history and problem list. Problem list updated.  Objective:   Vitals:   12/17/16 1532  BP: (!) 141/89  Pulse: 99  Weight: 179 lb (81.2 kg)    Fetal Status: Fetal Heart Rate (bpm): 154   Movement: Present     General:  Alert, oriented and cooperative. Patient is in no acute distress.  Skin: Skin is warm and dry. No rash noted.   Cardiovascular: Normal heart rate noted  Respiratory: Normal respiratory effort, no problems with respiration noted  Abdomen: Soft, gravid, appropriate for gestational age.  Pain/Pressure: Present     Pelvic: Cervical exam deferred        Extremities: Normal range of motion.  Edema: Trace  Mental Status:  Normal mood and affect. Normal behavior. Normal judgment and thought content.   Assessment and Plan:  Pregnancy: [id number] at [redacted]w[redacted]d  1. Supervision of high risk pregnancy, antepartum To L&D for elevated BPs in the face of a HA for labs and repeat BP and eval.  Pt give S/sx of preeclampsia  2. Personal history of pulmonary embolism On Lovenox. Last dose last pm   3. History of hyperthyroidism stable  4. Cryofibrinogenemia  Term labor symptoms and general obstetric precautions including but not limited to vaginal bleeding,  contractions, leaking of fluid and fetal movement were reviewed in detail with the patient. Please refer to After Visit Summary for other counseling recommendations.  Return in about 1 week (around 12/24/2016).   Willodean Rosenthal, MD

## 2016-12-17 NOTE — MAU Note (Signed)
BP up at dr's office. +HA, denies visual changes, or epigastric pain. Swelling in feet and hands are increasing.  No help with Tylenol

## 2016-12-17 NOTE — Progress Notes (Signed)
Labor Progress Note  Abigail James is a 31 y.o. [id number] at [redacted]w[redacted]d presented for IOL mild pre-eclampsia.  S: comfortable with FOB supportive at bedside. About to get Zantac for heart burn.  O:  BP 116/83   Pulse 94   Temp 97.8 F (36.6 C) (Oral)   Resp 16   Ht  (1.676 m)   LMP 03/25/2016   SpO2 99%   BMI 28.89 kg/m   SVE 2140: 2/50/-3, posterior, soft Cytotec #1 placed  FHR 150, moderate, +accels, no decels, quiet  A&P: 31 y.o. [id number] [redacted]w[redacted]d IOL mild pre-eclampsia. #Labor: will start induction with Cytotec x1 to ripen cervix then proceed with pitocin. #Mild PreE: intermittently elevated Bps mild range during pregnancy. 140s OA with headache for several days. Labs have been normal during pregnancy and OA. Trial Percocet - if no resolve, might consider magnesium for seizure prophylaxis. #Hx DVT/cryofibrinogenemia: used heparin in previous pregnancies for active DVT and PE. Was started on Lovenox this pregnancy 3 months ago after she spoke with several providers for second and third opinions. Last dose 9/18 in AM. Hold Lovenox and resume 24 hours postpartum. #Pain: desires epidural as labor progresses #FWB: cat 1 - reassuring #GBS negative  Burnard Leigh, MD 10:04 PM

## 2016-12-17 NOTE — H&P (Signed)
Abigail James is a 31 y.o. female presenting for hypertension and headache.  Seen at office and found to be hypertensive..  RN Note: BP up at dr's office. +HA, denies visual changes, or epigastric pain. Swelling in feet and hands are increasing.  No help with Tylenol  OB History    Gravida Para Term Preterm AB Living   SAB TAB Ectopic Multiple Live Births   1 0 0 0 2     Past Medical History:  Diagnosis Date  . DVT (deep venous thrombosis) (HCC)    Past Surgical History:  Procedure Laterality Date  . APPENDECTOMY    . CHOLECYSTECTOMY    . DILATION AND CURETTAGE OF UTERUS    . FOOT SURGERY    . LEG SURGERY    . TYMPANOSTOMY TUBE PLACEMENT     Family History: family history includes Cancer in her maternal grandmother and paternal grandmother; Diabetes in her paternal grandmother; Hypertension in her brother, father, maternal uncle, and paternal grandmother. Social History:  reports that she quit smoking about 2 years ago. She has never used smokeless tobacco. She reports that she drinks alcohol. She reports that she does not use drugs.     Maternal Diabetes: No Genetic Screening: Normal Maternal Ultrasounds/Referrals: Normal Fetal Ultrasounds or other Referrals:  None Maternal Substance Abuse:  No Significant Maternal Medications:  None Significant Maternal Lab Results:  Lab values include: Group B Strep negative Other Comments:  None  Review of Systems  Constitutional: Negative for chills and fever.  Eyes: Negative for blurred vision.  Cardiovascular: Negative for leg swelling.  Gastrointestinal: Negative for abdominal pain, constipation, diarrhea, nausea and vomiting.  Musculoskeletal: Negative for back pain.  Neurological: Positive for headaches.   Maternal Medical History:  Reason for admission: Nausea. Hypertension   Contractions: Frequency: irregular.   Perceived severity is mild.    Fetal activity: Perceived fetal activity is normal.    Last perceived fetal movement was within the past hour.    Prenatal complications: PIH and pre-eclampsia.   No placental abnormality or preterm labor.   Prenatal Complications - Diabetes: none.      Blood pressure (!) 148/92, pulse (!) 103, temperature 98.2 F (36.8 C), resp. rate 18, height  (1.676 m), last menstrual period 03/25/2016, SpO2 99 %.  Vitals:   12/17/16 1846 12/17/16 1847 12/17/16 1908 12/17/16 1915  BP: 132/87  (!) 148/93 (!) 148/92  Pulse: (!) 106  (!) 105 (!) 103  Resp:      Temp:      SpO2:      Height:   (1.676 m)      Maternal Exam:  Uterine Assessment: Contraction strength is mild.  Contraction frequency is irregular.   Abdomen: Patient reports no abdominal tenderness. Pelvis: adequate for delivery.   Was told 2-3cm/60% in office  Cervix: Cervix evaluated by digital exam.     Fetal Exam Fetal Monitor Review: Mode: ultrasound.   Baseline rate: 150.  Variability: moderate (6-25 bpm).   Pattern: accelerations present and no decelerations.    Fetal State Assessment: Category I - tracings are normal.     Physical Exam  Constitutional: She is oriented to person, place, and time. She appears well-developed and well-nourished. No distress.  HENT:  Head: Normocephalic.  Cardiovascular: Normal rate and regular rhythm.   Respiratory: Effort normal. No respiratory distress. She has no wheezes. She has no rales.  GI: Soft. She exhibits no distension. There  is no tenderness. There is no rebound and no guarding.  Musculoskeletal: Normal range of motion. She exhibits edema (1+ pedal).  Neurological: She is alert and oriented to person, place, and time. She displays abnormal reflex (3+ with 1 beat of clonus).  Skin: Skin is warm and dry.  Psychiatric: She has a normal mood and affect.    Prenatal labs: ABO, Rh: --/--/O POS (02/06 1917) Antibody:   Rubella:   RPR:    HBsAg:    HIV:    GBS:     Assessment/Plan: Single IUP at  [redacted]w[redacted]d Preeclampsia with headache/severe Normal labs   Admit to [facility] per consult Dr Despina Hidden Induction of labor   Wynelle Bourgeois 12/17/2016, 7:50 PM

## 2016-12-18 ENCOUNTER — Inpatient Hospital Stay (HOSPITAL_COMMUNITY): Payer: Medicaid Other | Admitting: Anesthesiology

## 2016-12-18 LAB — RPR: RPR: NONREACTIVE

## 2016-12-18 MED ORDER — FENTANYL CITRATE (PF) 100 MCG/2ML IJ SOLN
100.0000 ug | INTRAMUSCULAR | Status: DC | PRN
  Administered 2016-12-18 (×2): 100 ug via INTRAVENOUS
  Filled 2016-12-18: qty 2

## 2016-12-18 MED ORDER — FENTANYL CITRATE (PF) 100 MCG/2ML IJ SOLN
INTRAMUSCULAR | Status: AC
  Filled 2016-12-18: qty 2

## 2016-12-18 MED ORDER — PHENYLEPHRINE 40 MCG/ML (10ML) SYRINGE FOR IV PUSH (FOR BLOOD PRESSURE SUPPORT)
80.0000 ug | PREFILLED_SYRINGE | INTRAVENOUS | Status: DC | PRN
  Filled 2016-12-18: qty 10

## 2016-12-18 MED ORDER — EPHEDRINE 5 MG/ML INJ: 10.0000 mg | INTRAVENOUS | Status: DC | PRN

## 2016-12-18 MED ORDER — FENTANYL 2.5 MCG/ML BUPIVACAINE 1/10 % EPIDURAL INFUSION (WH - ANES)
14.0000 mL/h | INTRAMUSCULAR | Status: DC | PRN
  Administered 2016-12-18: 14 mL/h via EPIDURAL
  Filled 2016-12-18 (×2): qty 100

## 2016-12-18 MED ORDER — DIPHENHYDRAMINE HCL 50 MG/ML IJ SOLN: 12.5000 mg | INTRAMUSCULAR | Status: DC | PRN

## 2016-12-18 MED ORDER — GI COCKTAIL ~~LOC~~
30.0000 mL | Freq: Once | ORAL | Status: AC
  Administered 2016-12-18: 30 mL via ORAL
  Filled 2016-12-18: qty 30

## 2016-12-18 MED ORDER — MISOPROSTOL 25 MCG QUARTER TABLET
25.0000 ug | ORAL_TABLET | Freq: Once | ORAL | Status: DC
  Filled 2016-12-18: qty 1

## 2016-12-18 MED ORDER — PHENYLEPHRINE 40 MCG/ML (10ML) SYRINGE FOR IV PUSH (FOR BLOOD PRESSURE SUPPORT): 80.0000 ug | PREFILLED_SYRINGE | INTRAVENOUS | Status: DC | PRN

## 2016-12-18 MED ORDER — LACTATED RINGERS IV SOLN: 500.0000 mL | Freq: Once | INTRAVENOUS | Status: DC

## 2016-12-18 MED ORDER — LIDOCAINE HCL (PF) 1 % IJ SOLN
INTRAMUSCULAR | Status: DC | PRN
  Administered 2016-12-18 (×2): 5 mL

## 2016-12-18 NOTE — Anesthesia Pain Management Evaluation Note (Signed)
  CRNA Pain Management Visit Note  Patient: Abigail James, 31 y.o., female  "Hello I am a member of the anesthesia team at [facility]. We have an anesthesia team available at all times to provide care throughout the hospital, including epidural management and anesthesia for C-section. I don't know your plan for the delivery whether it a natural birth, water birth, IV sedation, nitrous supplementation, doula or epidural, but we want to meet your pain goals."   1.Was your pain managed to your expectations on prior hospitalizations?   No   2.What is your expectation for pain management during this hospitalization?     Labor support without medications, Epidural, IV pain meds and Nitrous Oxide  3.How can we help you reach that goal? Pt stated she had epidural before that didn't work well and she is unsure if she wants to try this time. Questions answered and pt reassured we are here if she needs [place].  Record the patient's initial score and the patient's pain goal.   Pain: 3  Pain Goal: 10 [facility] wants you to be able to say your pain was always managed very well.  Abigail James 12/18/2016

## 2016-12-18 NOTE — Progress Notes (Signed)
Labor Progress Note Abigail James is a 31 y.o. [id number] at [redacted]w[redacted]d presented for IOL for preeclampsia. S: Pt admits to increased discomfort with contractions.  O:  BP 125/76 (BP Location: Left Arm)   Pulse 91   Temp 98.1 F (36.7 C) (Oral)   Resp 16   Ht  (1.676 m)   LMP 03/25/2016   SpO2 99%   BMI 28.89 kg/m   CVE: Dilation: 6 Effacement (%): 70 Cervical Position: Posterior Station: -2 Presentation: Vertex Exam by:: MD Phelps   A&P: 31 y.o. [id number] [redacted]w[redacted]d here for IOL for prreclampsia #Labor: Progressing well. AROM at 1810, clear fluid #GBS negative   Rolm Bookbinder, DO 6:40 PM

## 2016-12-18 NOTE — Progress Notes (Signed)
Abigail James is a 31 y.o. [id number] at [redacted]w[redacted]d by LMP admitted for induction of labor due to preeclampsia.  Pregnancy also complicated by h/o DVT/PE on Lovenox.  Subjective: Doing well.  Feeling contractions frequently but not especially uncomfortable with them.   Objective: BP 134/85   Pulse 90   Temp 97.6 F (36.4 C) (Oral)   Resp 16   Ht  (1.676 m)   LMP 03/25/2016   SpO2 99%   BMI 28.89 kg/m  No intake/output data recorded. No intake/output data recorded.  FHT:  FHR: 150 bpm, variability: moderate,  accelerations:  Present,  decelerations:  Absent UC:   regular, every 2 minutes SVE:   Dilation: 3 Effacement (%): 60, 70 Station: -3 Exam by:: Keys  Labs: Lab Results  Component Value Date   WBC 15.9 (H) 12/17/2016   HGB 11.4 (L) 12/17/2016   HCT 35.4 (L) 12/17/2016   MCV 70.1 (L) 12/17/2016   PLT 168 12/17/2016    Assessment / Plan: IOL secondary to pre-eclampsia, on pitocin  Labor: No progress since pitocin started around 0630; continue expectant management Preeclampsia:  no signs or symptoms of toxicity Fetal Wellbeing:  Category I Pain Control:  Labor support without medications I/D:  n/a  Thrombophilia: Continue SCDs for now Anticipated MOD:  NSVD  Larene Beach, DO PGY-2 Family Medicine Resident 12/18/2016, 10:42 AM

## 2016-12-18 NOTE — Anesthesia Preprocedure Evaluation (Signed)
Anesthesia Evaluation  Patient identified by MRN, date of birth, ID band Patient awake    Reviewed: Allergy & Precautions, H&P , NPO status , Patient's Chart, lab work & pertinent test results  History of Anesthesia Complications Negative for: history of anesthetic complications  Airway Mallampati: II  TM Distance: >3 FB Neck ROM: full    Dental no notable dental hx. (+) Teeth Intact   Pulmonary neg pulmonary ROS, former smoker,    Pulmonary exam normal breath sounds clear to auscultation       Cardiovascular negative cardio ROS Normal cardiovascular exam Rhythm:regular Rate:Normal     Neuro/Psych negative neurological ROS  negative psych ROS   GI/Hepatic negative GI ROS, Neg liver ROS,   Endo/Other  negative endocrine ROS  Renal/GU negative Renal ROS  negative genitourinary   Musculoskeletal   Abdominal   Peds  Hematology Cryofibrinogenemia Was on lovenox. Off x 2 days   Anesthesia Other Findings   Reproductive/Obstetrics (+) Pregnancy                             Anesthesia Physical Anesthesia Plan  ASA: II  Anesthesia Plan: Epidural   Post-op Pain Management:    Induction:   PONV Risk Score and Plan:   Airway Management Planned:   Additional Equipment:   Intra-op Plan:   Post-operative Plan:   Informed Consent: I have reviewed the patients History and Physical, chart, labs and discussed the procedure including the risks, benefits and alternatives for the proposed anesthesia with the patient or authorized representative who has indicated his/her understanding and acceptance.     Plan Discussed with:   Anesthesia Plan Comments:         Anesthesia Quick Evaluation

## 2016-12-18 NOTE — Progress Notes (Signed)
Labor Progress Note  Abigail James is a 31 y.o. [id number] at [redacted]w[redacted]d  admitted for induction of labor due to preeclampsia.  S: Patient without concerns. She wants to have a natural delivery. Denies PIH symptoms (HA, scotomata, RUQ pain).   O:  BP 134/85   Pulse 90   Temp 97.6 F (36.4 C) (Oral)   Resp 16   Ht  (1.676 m)   LMP 03/25/2016   SpO2 99%   BMI 28.89 kg/m   No intake/output data recorded.  FHT:  FHR: 135 bpm, variability: moderate,  accelerations:  Present,  decelerations:  Absent UC:   regular, every 2-3 minutes SVE:   Dilation: 3.5 Effacement (%): 70 Station: -2, -3 Exam by:: Lorn Junes, RN Membranes intact  Pitocin @ 6 mu/min  Labs: Lab Results  Component Value Date   WBC 15.9 (H) 12/17/2016   HGB 11.4 (L) 12/17/2016   HCT 35.4 (L) 12/17/2016   MCV 70.1 (L) 12/17/2016   PLT 168 12/17/2016    Assessment / Plan: 31 y.o. 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

## 2016-12-18 NOTE — Progress Notes (Addendum)
HA improved with one time Percocet. VSS. SVE 3/50/-3/ballotable. Will place Cytotec #2. FHR 130, mod, +accels, no decels, irregular. Likely start pitocin 4 hours later. Burna Cash, MD Family Medicine Resident, PGY-3 12/18/2016 2:10 AM

## 2016-12-18 NOTE — Anesthesia Procedure Notes (Signed)
Epidural Patient location during procedure: OB  Staffing Anesthesiologist: Arissa Fagin Performed: anesthesiologist   Preanesthetic Checklist Completed: patient identified, site marked, surgical consent, pre-op evaluation, timeout performed, IV checked, risks and benefits discussed and monitors and equipment checked  Epidural Patient position: sitting Prep: DuraPrep Patient monitoring: heart rate, continuous pulse ox and blood pressure Approach: right paramedian Location: L3-L4 Injection technique: LOR saline  Needle:  Needle type: Tuohy  Needle gauge: 17 G Needle length: 9 cm and 9 Needle insertion depth: 7 cm Catheter type: closed end flexible Catheter size: 20 Guage Catheter at skin depth: 11 cm Test dose: negative  Assessment Events: blood not aspirated, injection not painful, no injection resistance, negative IV test and no paresthesia  Additional Notes Patient identified. Risks/Benefits/Options discussed with patient including but not limited to bleeding, infection, nerve damage, paralysis, failed block, incomplete pain control, headache, blood pressure changes, nausea, vomiting, reactions to medication both or allergic, itching and postpartum back pain. Confirmed with bedside nurse the patient's most recent platelet count. Confirmed with patient that they are not currently taking any anticoagulation, have any bleeding history or any family history of bleeding disorders. Patient expressed understanding and wished to proceed. All questions were answered. Sterile technique was used throughout the entire procedure. Please see nursing notes for vital signs. Test dose was given through epidural needle and negative prior to continuing to dose epidural or start infusion. Warning signs of high block given to the patient including shortness of breath, tingling/numbness in hands, complete motor block, or any concerning symptoms with instructions to call for help. Patient was given  instructions on fall risk and not to get out of bed. All questions and concerns addressed with instructions to call with any issues.     

## 2016-12-19 ENCOUNTER — Encounter (HOSPITAL_COMMUNITY)
Admission: AD | Disposition: A | Discharge status: Home / Self Care | Payer: Self-pay | Source: Ambulatory Visit | Attending: Obstetrics & Gynecology

## 2016-12-19 ENCOUNTER — Encounter (HOSPITAL_COMMUNITY): Payer: Self-pay | Admitting: Obstetrics and Gynecology

## 2016-12-19 DIAGNOSIS — O1413 Severe pre-eclampsia, third trimester: Secondary | ICD-10-CM

## 2016-12-19 DIAGNOSIS — Z3A38 38 weeks gestation of pregnancy: Secondary | ICD-10-CM

## 2016-12-19 LAB — BIRTH TISSUE RECOVERY COLLECTION (PLACENTA DONATION)

## 2016-12-19 LAB — CBC
HEMATOCRIT: 29.8 % — AB (ref 36.0–46.0)
HEMATOCRIT: 29.1 % — AB (ref 36.0–46.0)
HEMOGLOBIN: 9.7 g/dL — AB (ref 12.0–15.0)
HEMOGLOBIN: 9.3 g/dL — AB (ref 12.0–15.0)
MCH: 22.8 pg — AB (ref 26.0–34.0)
MCH: 22.6 pg — AB (ref 26.0–34.0)
MCHC: 32.6 g/dL (ref 30.0–36.0)
MCHC: 32 g/dL (ref 30.0–36.0)
MCV: 70 fL — AB (ref 78.0–100.0)
MCV: 70.6 fL — AB (ref 78.0–100.0)
Platelets: 174 10*3/uL (ref 150–400)
Platelets: 191 10*3/uL (ref 150–400)
RBC: 4.26 MIL/uL (ref 3.87–5.11)
RBC: 4.12 MIL/uL (ref 3.87–5.11)
RDW: 16.2 % — ABNORMAL HIGH (ref 11.5–15.5)
RDW: 16.2 % — AB (ref 11.5–15.5)
WBC: 31 10*3/uL — AB (ref 4.0–10.5)
WBC: 34.8 10*3/uL — ABNORMAL HIGH (ref 4.0–10.5)

## 2016-12-19 SURGERY — Surgical Case
Anesthesia: Epidural

## 2016-12-19 MED ORDER — MENTHOL 3 MG MT LOZG: 1.0000 | LOZENGE | OROMUCOSAL | Status: DC | PRN

## 2016-12-19 MED ORDER — LACTATED RINGERS IV SOLN: INTRAVENOUS | Status: DC

## 2016-12-19 MED ORDER — SODIUM BICARBONATE 8.4 % IV SOLN
INTRAVENOUS | Status: DC | PRN
  Administered 2016-12-19 (×3): 5 mL via EPIDURAL

## 2016-12-19 MED ORDER — LACTATED RINGERS IV SOLN
INTRAVENOUS | Status: DC
  Administered 2016-12-19: 12:00:00 via INTRAVENOUS

## 2016-12-19 MED ORDER — KETOROLAC TROMETHAMINE 30 MG/ML IJ SOLN
INTRAMUSCULAR | Status: AC
Start: 2016-12-19 — End: 2016-12-19
  Filled 2016-12-19: qty 1

## 2016-12-19 MED ORDER — CEFAZOLIN SODIUM-DEXTROSE 2-4 GM/100ML-% IV SOLN
2.0000 g | Freq: Once | INTRAVENOUS | Status: AC
  Administered 2016-12-19: 2 g via INTRAVENOUS
  Filled 2016-12-19: qty 100

## 2016-12-19 MED ORDER — MORPHINE SULFATE (PF) 0.5 MG/ML IJ SOLN
INTRAMUSCULAR | Status: AC
Start: 2016-12-19 — End: 2016-12-19
  Filled 2016-12-19: qty 20

## 2016-12-19 MED ORDER — ONDANSETRON HCL 4 MG/2ML IJ SOLN: 4.0000 mg | Freq: Three times a day (TID) | INTRAMUSCULAR | Status: DC | PRN

## 2016-12-19 MED ORDER — KETOROLAC TROMETHAMINE 30 MG/ML IJ SOLN: 30.0000 mg | Freq: Four times a day (QID) | INTRAMUSCULAR | Status: AC | PRN

## 2016-12-19 MED ORDER — DIPHENHYDRAMINE HCL 25 MG PO CAPS: 25.0000 mg | ORAL_CAPSULE | Freq: Four times a day (QID) | ORAL | Status: DC | PRN

## 2016-12-19 MED ORDER — CEFAZOLIN SODIUM-DEXTROSE 2-4 GM/100ML-% IV SOLN
INTRAVENOUS | Status: AC
  Filled 2016-12-19: qty 100

## 2016-12-19 MED ORDER — MIDAZOLAM HCL 5 MG/5ML IJ SOLN
INTRAMUSCULAR | Status: DC | PRN
  Administered 2016-12-19: 2 mg via INTRAVENOUS

## 2016-12-19 MED ORDER — METOCLOPRAMIDE HCL 5 MG/ML IJ SOLN: 10.0000 mg | Freq: Once | INTRAMUSCULAR | Status: DC | PRN

## 2016-12-19 MED ORDER — FERROUS SULFATE 325 (65 FE) MG PO TABS
325.0000 mg | ORAL_TABLET | Freq: Every day | ORAL | Status: DC
  Administered 2016-12-19 – 2016-12-22 (×4): 325 mg via ORAL
  Filled 2016-12-19 (×4): qty 1

## 2016-12-19 MED ORDER — OXYTOCIN 10 UNIT/ML IJ SOLN
INTRAVENOUS | Status: DC | PRN
  Administered 2016-12-19: 40 [IU] via INTRAVENOUS

## 2016-12-19 MED ORDER — SIMETHICONE 80 MG PO CHEW
80.0000 mg | CHEWABLE_TABLET | Freq: Three times a day (TID) | ORAL | Status: DC
  Administered 2016-12-19 – 2016-12-22 (×6): 80 mg via ORAL
  Filled 2016-12-19 (×9): qty 1

## 2016-12-19 MED ORDER — NALBUPHINE HCL 10 MG/ML IJ SOLN: 5.0000 mg | INTRAMUSCULAR | Status: DC | PRN

## 2016-12-19 MED ORDER — SCOPOLAMINE 1 MG/3DAYS TD PT72
MEDICATED_PATCH | TRANSDERMAL | Status: DC | PRN
  Administered 2016-12-19: 1 via TRANSDERMAL

## 2016-12-19 MED ORDER — KETAMINE HCL 10 MG/ML IJ SOLN
INTRAMUSCULAR | Status: AC
  Filled 2016-12-19: qty 1

## 2016-12-19 MED ORDER — OXYTOCIN 40 UNITS IN LACTATED RINGERS INFUSION - SIMPLE MED: 2.5000 [IU]/h | INTRAVENOUS | Status: AC

## 2016-12-19 MED ORDER — KETAMINE HCL 10 MG/ML IJ SOLN
INTRAMUSCULAR | Status: DC | PRN
  Administered 2016-12-19 (×2): 10 mg via INTRAVENOUS

## 2016-12-19 MED ORDER — MIDAZOLAM HCL 2 MG/2ML IJ SOLN
INTRAMUSCULAR | Status: AC
  Filled 2016-12-19: qty 2

## 2016-12-19 MED ORDER — GENTAMICIN SULFATE 40 MG/ML IJ SOLN: 5.0000 mg/kg | INTRAVENOUS | Status: DC

## 2016-12-19 MED ORDER — LACTATED RINGERS IV SOLN
INTRAVENOUS | Status: DC | PRN
  Administered 2016-12-19: 02:00:00 via INTRAVENOUS

## 2016-12-19 MED ORDER — NALOXONE HCL 2 MG/2ML IJ SOSY
1.0000 ug/kg/h | PREFILLED_SYRINGE | INTRAMUSCULAR | Status: DC | PRN
  Filled 2016-12-19: qty 2

## 2016-12-19 MED ORDER — KETOROLAC TROMETHAMINE 30 MG/ML IJ SOLN
30.0000 mg | Freq: Four times a day (QID) | INTRAMUSCULAR | Status: AC | PRN
  Administered 2016-12-19: 30 mg via INTRAMUSCULAR

## 2016-12-19 MED ORDER — ACETAMINOPHEN 500 MG PO TABS
1000.0000 mg | ORAL_TABLET | Freq: Four times a day (QID) | ORAL | Status: AC
  Administered 2016-12-19 (×3): 1000 mg via ORAL
  Filled 2016-12-19 (×3): qty 2

## 2016-12-19 MED ORDER — TETANUS-DIPHTH-ACELL PERTUSSIS 5-2.5-18.5 LF-MCG/0.5 IM SUSP: 0.5000 mL | Freq: Once | INTRAMUSCULAR | Status: DC

## 2016-12-19 MED ORDER — ONDANSETRON HCL 4 MG/2ML IJ SOLN
INTRAMUSCULAR | Status: AC
  Filled 2016-12-19: qty 2

## 2016-12-19 MED ORDER — CLINDAMYCIN PHOSPHATE 900 MG/50ML IV SOLN: 900.0000 mg | INTRAVENOUS | Status: DC

## 2016-12-19 MED ORDER — DIPHENHYDRAMINE HCL 50 MG/ML IJ SOLN: 12.5000 mg | INTRAMUSCULAR | Status: DC | PRN

## 2016-12-19 MED ORDER — DEXTROSE 5 % IV SOLN
500.0000 mg | Freq: Once | INTRAVENOUS | Status: AC
  Administered 2016-12-19: 500 mg via INTRAVENOUS
  Filled 2016-12-19: qty 500

## 2016-12-19 MED ORDER — POLYETHYLENE GLYCOL 3350 17 G PO PACK
17.0000 g | PACK | Freq: Every day | ORAL | Status: DC
  Administered 2016-12-21: 17 g via ORAL
  Filled 2016-12-19 (×4): qty 1

## 2016-12-19 MED ORDER — MEPERIDINE HCL 25 MG/ML IJ SOLN
INTRAMUSCULAR | Status: DC | PRN
  Administered 2016-12-19 (×2): 12.5 mg via INTRAVENOUS

## 2016-12-19 MED ORDER — INFLUENZA VAC SPLIT QUAD 0.5 ML IM SUSY
0.5000 mL | PREFILLED_SYRINGE | INTRAMUSCULAR | Status: DC
  Filled 2016-12-19: qty 0.5

## 2016-12-19 MED ORDER — OXYCODONE HCL 5 MG PO TABS
5.0000 mg | ORAL_TABLET | ORAL | Status: DC | PRN
  Administered 2016-12-20: 5 mg via ORAL
  Filled 2016-12-19: qty 1

## 2016-12-19 MED ORDER — SENNOSIDES-DOCUSATE SODIUM 8.6-50 MG PO TABS: 2.0000 | ORAL_TABLET | Freq: Every evening | ORAL | Status: DC | PRN

## 2016-12-19 MED ORDER — MORPHINE SULFATE (PF) 0.5 MG/ML IJ SOLN
INTRAMUSCULAR | Status: DC | PRN
Start: 2016-12-19 — End: 2016-12-19
  Administered 2016-12-19: 1 mg via INTRAVENOUS
  Administered 2016-12-19: 4 mg via EPIDURAL

## 2016-12-19 MED ORDER — SCOPOLAMINE 1 MG/3DAYS TD PT72
1.0000 | MEDICATED_PATCH | Freq: Once | TRANSDERMAL | Status: DC
  Filled 2016-12-19: qty 1

## 2016-12-19 MED ORDER — WITCH HAZEL-GLYCERIN EX PADS: 1.0000 "application " | MEDICATED_PAD | CUTANEOUS | Status: DC | PRN

## 2016-12-19 MED ORDER — SODIUM CHLORIDE 0.9% FLUSH: 3.0000 mL | INTRAVENOUS | Status: DC | PRN

## 2016-12-19 MED ORDER — MEPERIDINE HCL 25 MG/ML IJ SOLN
INTRAMUSCULAR | Status: AC
  Filled 2016-12-19: qty 1

## 2016-12-19 MED ORDER — NALBUPHINE HCL 10 MG/ML IJ SOLN: 5.0000 mg | Freq: Once | INTRAMUSCULAR | Status: DC | PRN

## 2016-12-19 MED ORDER — FENTANYL CITRATE (PF) 100 MCG/2ML IJ SOLN
INTRAMUSCULAR | Status: AC
  Filled 2016-12-19: qty 2

## 2016-12-19 MED ORDER — COCONUT OIL OIL
1.0000 "application " | TOPICAL_OIL | Status: DC | PRN
  Administered 2016-12-21: 1 via TOPICAL
  Filled 2016-12-19: qty 120

## 2016-12-19 MED ORDER — DEXAMETHASONE SODIUM PHOSPHATE 10 MG/ML IJ SOLN
INTRAMUSCULAR | Status: AC
  Filled 2016-12-19: qty 1

## 2016-12-19 MED ORDER — DEXAMETHASONE SODIUM PHOSPHATE 10 MG/ML IJ SOLN
INTRAMUSCULAR | Status: DC | PRN
  Administered 2016-12-19: 10 mg via INTRAVENOUS

## 2016-12-19 MED ORDER — PRENATAL MULTIVITAMIN CH
1.0000 | ORAL_TABLET | Freq: Every day | ORAL | Status: DC
  Administered 2016-12-19 – 2016-12-22 (×4): 1 via ORAL
  Filled 2016-12-19 (×4): qty 1

## 2016-12-19 MED ORDER — DIBUCAINE 1 % RE OINT: 1.0000 "application " | TOPICAL_OINTMENT | RECTAL | Status: DC | PRN

## 2016-12-19 MED ORDER — OXYCODONE HCL 5 MG PO TABS
10.0000 mg | ORAL_TABLET | ORAL | Status: DC | PRN
  Administered 2016-12-20 – 2016-12-22 (×6): 10 mg via ORAL
  Filled 2016-12-19 (×6): qty 2

## 2016-12-19 MED ORDER — ACETAMINOPHEN 325 MG PO TABS
650.0000 mg | ORAL_TABLET | ORAL | Status: DC | PRN
Start: 2016-12-19 — End: 2016-12-22
  Administered 2016-12-20 – 2016-12-22 (×5): 650 mg via ORAL
  Filled 2016-12-19 (×5): qty 2

## 2016-12-19 MED ORDER — SCOPOLAMINE 1 MG/3DAYS TD PT72
MEDICATED_PATCH | TRANSDERMAL | Status: AC
  Filled 2016-12-19: qty 1

## 2016-12-19 MED ORDER — FENTANYL CITRATE (PF) 100 MCG/2ML IJ SOLN
INTRAMUSCULAR | Status: DC | PRN
  Administered 2016-12-19: 100 ug via INTRAVENOUS

## 2016-12-19 MED ORDER — MEPERIDINE HCL 25 MG/ML IJ SOLN: 6.2500 mg | INTRAMUSCULAR | Status: DC | PRN

## 2016-12-19 MED ORDER — SODIUM CHLORIDE 0.9 % IR SOLN
Status: DC | PRN
  Administered 2016-12-19: 1000 mL

## 2016-12-19 MED ORDER — DIPHENHYDRAMINE HCL 25 MG PO CAPS: 25.0000 mg | ORAL_CAPSULE | ORAL | Status: DC | PRN

## 2016-12-19 MED ORDER — NALOXONE HCL 0.4 MG/ML IJ SOLN: 0.4000 mg | INTRAMUSCULAR | Status: DC | PRN

## 2016-12-19 MED ORDER — FENTANYL CITRATE (PF) 100 MCG/2ML IJ SOLN: 25.0000 ug | INTRAMUSCULAR | Status: DC | PRN

## 2016-12-19 MED ORDER — OXYTOCIN 10 UNIT/ML IJ SOLN
INTRAMUSCULAR | Status: AC
  Filled 2016-12-19: qty 4

## 2016-12-19 MED ORDER — ONDANSETRON HCL 4 MG/2ML IJ SOLN
INTRAMUSCULAR | Status: DC | PRN
  Administered 2016-12-19: 4 mg via INTRAVENOUS

## 2016-12-19 SURGICAL SUPPLY — 40 items
ADH SKN CLS APL DERMABOND .7 (GAUZE/BANDAGES/DRESSINGS) ×1
APL SKNCLS STERI-STRIP NONHPOA (GAUZE/BANDAGES/DRESSINGS) ×1
BENZOIN TINCTURE PRP APPL 2/3 (GAUZE/BANDAGES/DRESSINGS) ×2 IMPLANT
CANISTER SUCT 3000ML PPV (MISCELLANEOUS) ×3 IMPLANT
CHLORAPREP W/TINT 26ML (MISCELLANEOUS) ×3 IMPLANT
CLAMP CORD UMBIL (MISCELLANEOUS) ×2 IMPLANT
CLOSURE WOUND 1/2 X4 (GAUZE/BANDAGES/DRESSINGS) ×1
DERMABOND ADVANCED (GAUZE/BANDAGES/DRESSINGS) ×2
DERMABOND ADVANCED .7 DNX12 (GAUZE/BANDAGES/DRESSINGS) ×1 IMPLANT
DRSG OPSITE POSTOP 4X10 (GAUZE/BANDAGES/DRESSINGS) ×3 IMPLANT
ELECT REM PT RETURN 9FT ADLT (ELECTROSURGICAL) ×3
ELECTRODE REM PT RTRN 9FT ADLT (ELECTROSURGICAL) ×1 IMPLANT
EXTRACTOR VACUUM KIWI (MISCELLANEOUS) ×1 IMPLANT
GAUZE SPONGE 4X4 12PLY STRL LF (GAUZE/BANDAGES/DRESSINGS) ×4 IMPLANT
GLOVE BIOGEL PI IND STRL 7.0 (GLOVE) ×2 IMPLANT
GLOVE BIOGEL PI IND STRL 7.5 (GLOVE) ×1 IMPLANT
GLOVE BIOGEL PI INDICATOR 7.0 (GLOVE) ×8
GLOVE BIOGEL PI INDICATOR 7.5 (GLOVE) ×2
GLOVE SKINSENSE NS SZ7.0 (GLOVE) ×4
GLOVE SKINSENSE STRL SZ7.0 (GLOVE) ×1 IMPLANT
GOWN STRL REUS W/ TWL LRG LVL3 (GOWN DISPOSABLE) ×2 IMPLANT
GOWN STRL REUS W/ TWL XL LVL3 (GOWN DISPOSABLE) ×1 IMPLANT
GOWN STRL REUS W/TWL LRG LVL3 (GOWN DISPOSABLE) ×6
GOWN STRL REUS W/TWL XL LVL3 (GOWN DISPOSABLE) ×3
NS IRRIG 1000ML POUR BTL (IV SOLUTION) ×3 IMPLANT
PACK C SECTION WH (CUSTOM PROCEDURE TRAY) ×3 IMPLANT
PAD ABD 8X10 STRL (GAUZE/BANDAGES/DRESSINGS) ×2 IMPLANT
PAD OB MATERNITY 4.3X12.25 (PERSONAL CARE ITEMS) ×3 IMPLANT
PAD PREP 24X48 CUFFED NSTRL (MISCELLANEOUS) ×3 IMPLANT
RTRCTR C-SECT PINK 25CM LRG (MISCELLANEOUS) ×2 IMPLANT
STRIP CLOSURE SKIN 1/2X4 (GAUZE/BANDAGES/DRESSINGS) ×1 IMPLANT
SUT CHROMIC 1 CTX 36 (SUTURE) ×2 IMPLANT
SUT MON AB 4-0 PS1 27 (SUTURE) ×5 IMPLANT
SUT MON AB-0 CT1 36 (SUTURE) ×8 IMPLANT
SUT PLAIN 2 0 (SUTURE) ×6
SUT PLAIN ABS 2-0 CT1 27XMFL (SUTURE) ×1 IMPLANT
SUT VIC AB 0 CT1 36 (SUTURE) ×6 IMPLANT
SUT VIC AB 3-0 CT1 27 (SUTURE) ×3
SUT VIC AB 3-0 CT1 TAPERPNT 27 (SUTURE) ×1 IMPLANT
TAPE CLOTH SURG 4X10 WHT LF (GAUZE/BANDAGES/DRESSINGS) ×2 IMPLANT

## 2016-12-19 NOTE — Progress Notes (Signed)
OB Note CTSP re: arrest of descent. Patient has been pushing since approx 2200  165 baseline, no accels, +variables, mod var q2-92m UCs NAD Complete/large amount of caput,unable to tell position, BPD at least at 0 station   A/p: arrest of descent Unable to tell position and with large amount of caput, so unable to offer assistance from below with operative vaginal delivery. D/w pt that recommend proceeding with primary c-section which they are amenable to. Pitocin stopped and continue aminoinfusion.   Cornelia Copa MD Attending Center for [employer] (Faculty Practice) 12/19/2016 Time: [phone]

## 2016-12-19 NOTE — Transfer of Care (Signed)
Immediate Anesthesia Transfer of Care Note  Patient: Abigail James  Procedure(s) Performed: Procedure(s): CESAREAN SECTION (N/A)  Patient Location: PACU  Anesthesia Type:Epidural  Level of Consciousness: awake, alert , oriented and patient cooperative  Airway & Oxygen Therapy: Patient Spontanous Breathing  Post-op Assessment: Report given to RN and Post -op Vital signs reviewed and stable  Post vital signs: Reviewed and stable  Last Vitals:  Vitals:   12/19/16 0100 12/19/16 0130  BP: 123/80 123/77  Pulse: (!) 110 (!) 115  Resp:    Temp:    SpO2:      Last Pain:  Vitals:   12/18/16 2300  TempSrc: Axillary  PainSc:       Patients Stated Pain Goal: 8 (12/18/16 1030)  Complications: No apparent anesthesia complications

## 2016-12-19 NOTE — Anesthesia Postprocedure Evaluation (Signed)
Anesthesia Post Note  Patient: Abigail James  Procedure(s) Performed: Procedure(s) (LRB): CESAREAN SECTION (N/A)     Patient location during evaluation: Mother Baby Anesthesia Type: Epidural Level of consciousness: awake and alert, oriented and patient cooperative Pain management: pain level controlled Vital Signs Assessment: post-procedure vital signs reviewed and stable Respiratory status: spontaneous breathing Cardiovascular status: stable Postop Assessment: no headache, epidural receding, patient able to bend at knees and no signs of nausea or vomiting Anesthetic complications: no Comments: Pt reports pain score of 0.    Last Vitals:  Vitals:   12/19/16 0612 12/19/16 0715  BP: 116/74 (!) 94/44  Pulse: (!) 104 89  Resp: 18 18  Temp: 36.9 C 36.5 C  SpO2: 99% 91%    Last Pain:  Vitals:   12/19/16 0715  TempSrc: Oral  PainSc:    Pain Goal: Patients Stated Pain Goal: 8 (12/18/16 1030)               [facility]

## 2016-12-19 NOTE — Progress Notes (Signed)
OB Note Patient states she was told by her mom that she had a rash and itching with amox, when she was a child; she doesn't recall any sob or being told of any. She states that she's taken an antibiotic that had something similar to amoxicillin or pcn as an adult and she did fine with it. Will do ancef for pre op antibiotics and azithromycin.  Cornelia Copa MD Attending Center for [employer] (Faculty Practice) 12/19/2016 Time: [phone]

## 2016-12-19 NOTE — Op Note (Signed)
Operative Note   SURGERY DATE: 12/19/2016  PRE-OP DIAGNOSIS:  *Pregnancy at 38/3 *Arrest of descent *On anticoagulation this pregnancy for history of prior pregnancy VTE  POST-OP DIAGNOSIS: Same. Delivered   PROCEDURE: primary low transverse cesarean section via pfannenstiel skin incision with double layer uterine closure  SURGEON: Surgeon(s) and Role:    * [place] Bing, MD - Primary  ASSISTANT: none  ANESTHESIA: epidural  ESTIMATED BLOOD LOSS:  DRAINS: UOP via indwelling foley  TOTAL IV FLUIDS: crystalloid  VTE PROPHYLAXIS: SCDs to bilateral lower extremities  ANTIBIOTICS: Two grams of Cefazolin were given within 1 hour of skin incision and azithromycin  IV x 1 was given intra op  SPECIMENS: none  COMPLICATIONS: none  INDICATIONS: Pushing for over three hours and only at 0 station and large amount of caput.   FINDINGS: No intra-abdominal adhesions were noted. Grossly normal uterus, tubes and ovaries. Clear amniotic fluid (patient was receiving an aminoinfusion), cephalic (LOT with tight nuchal x 2), fetus was not engaged in the pelvis, so most likely arrest due to cord constraining progress; moderate amount of caput. Female infant, weight 4054gm, APGARs 8/9, intact placenta.  PROCEDURE IN DETAIL: The patient was taken to the operating room where anesthesia was administered and normal fetal heart tones were confirmed. She was then prepped and draped in the normal fashion in the dorsal supine position with a leftward tilt.  After a time out was performed, a pfannensteil  skin incision was made with the scalpel and carried through to the underlying layer of fascia. The fascia was then incised at the midline and this incision was extended laterally with the mayo scissors. Attention was turned to the superior aspect of the fascial incision which was grasped with the kocher clamps x 2, tented up and the rectus muscles were dissected off with the bovie. In a  similar fashion the inferior aspect of the fascial incision was grasped with the kocher clamps, tented up and the rectus muscles dissected off with the mayo scissors. The rectus muscles were then separated in the midline and the peritoneum was entered bluntly. The bladder blade was inserted and the vesicouterine peritoneum was identified, tented up and entered with the metzenbaum scissors. This incision was extended laterally and the bladder flap was created digitally. The bladder blade was reinserted.  A low transverse hysterotomy was made with the scalpel until the endometrial cavity was breached and the amniotic sac ruptured yielding clear amniotic fluid. This incision was extended bluntly and the infant's head, shoulders and body were delivered atraumatically after reduction of both nuchal cords.The cord was clamped x 2 and cut, and the infant was handed to the awaiting pediatricians, after delayed cord clamping was done.  The placenta was then gradually expressed from the uterus and then the uterus was exteriorized and cleared of all clots and debris. The hysterotomy was repaired with a running suture of 1-0 monocryl. A second imbricating layer of 1-0 monocryl suture was then placed. One figure-of-eight suture of #1 chromic were added to achieve excellent hemostasis.   The uterus and adnexa were then returned to the abdomen, and the hysterotomy and all operative sites were reinspected and excellent hemostasis was noted after irrigation and suction of the abdomen with warm saline.  The peritoneum was closed with a running stitch of 3-0 Vicryl. The fascia was reapproximated with 0 Vicryl in a simple running fashion bilaterally. The subcutaneous layer was then reapproximated with interrupted sutures of 2-0 plain gut, and the skin  was then closed with 4-0 monocryl, in a subcuticular fashion.  The patient  tolerated the procedure well. Sponge, lap, needle, and instrument counts were correct x 2. The patient  was transferred to the recovery room awake, alert and breathing independently in stable condition.  Will restart her adjusted dose lovenox 24 hours after removal of the epidural catheter.   Cornelia Copa MD Attending Center for [facility])

## 2016-12-19 NOTE — Lactation Note (Addendum)
This note was copied from a baby's chart. Lactation Consultation Note  Patient Name: Abigail James Today's Date: 12/19/2016   Mom is a P3 who nursed her 1st 2 children for 4-8 weeks, but cites low supply & "not knowing what she was doing" as reasons for discontinuing early. (Those children are now 9-32 years old). This infant is now about 13 hours old & has not fed often. Mom had thought she was to offer breast only when infant cried. Mom says she's been leaking colostrum for 3 weeks. Hand expression was taught to Mom, but only about 1ml was obtained.  Infant was observed at the breast briefly at the 1455 feeding, but he was very sleepy. Mom was found to be in the room by herself with the infant, sleeping in the recliner while breastfeeding, when I entered room. She fell asleep again while I was in there. When I told Mom that I couldn't let her fall asleep in the chair with the baby, she seemed surprised & said, "You mean I can't sleep with the baby?" I answered in the negative & Mom seemed to understand.  Infant with a scalp abrasion surrounded by a large bruise. Mom was shown how to use DEBP & how to disassemble, clean, & reassemble parts. Mom knows to pump q3h on the preemie setting for 15 min. Mom was shown how to assemble & use hand pump that was included in pump kit.   Although Mom is not a primip & technically, this is her 3rd-time breastfeeding, this mother needs to be treated as a 1st-time breastfeeding mother.   Mom's chart mentions that she has thyroid dysfunction. Mom denies any issues with her thyroid. Pierina, RN given report of the above.   Richey, Kimberely Hamilton 12/19/2016, 3:39 PM    

## 2016-12-19 NOTE — Consult Note (Signed)
Neonatology Note:   Attendance at C-section:    I was asked by Dr. Vergie Living to attend this C/S at term due to [facility]. The mother is a [id number], GBS negative with good prenatal care and h/o DVT. ROM 8 hours before delivery, fluid clear. Infant vigorous with good spontaneous cry and tone. +60 sec DCC.   Needed only minimal bulb suctioning. Ap 8/9. Lungs clear to ausc in DR. To CN to care of Pediatrician.  Dineen Kid Leary Roca, MD

## 2016-12-20 MED ORDER — ENOXAPARIN SODIUM 40 MG/0.4ML ~~LOC~~ SOLN
40.0000 mg | SUBCUTANEOUS | Status: DC
  Administered 2016-12-20 – 2016-12-22 (×3): 40 mg via SUBCUTANEOUS
  Filled 2016-12-20 (×4): qty 0.4

## 2016-12-20 NOTE — Lactation Note (Signed)
This note was copied from a baby's chart. Lactation Consultation Note  Patient Name: Abigail James [id number] Date: 12/20/2016 Reason for consult: Follow-up assessment   With this term baby, now 42 hours old. He was at 6% weight loss at 29 hours of life - high.  On exam of his mouth, he has cobblestoning on his lips, and short, thick posterior frenulum, severe cupping with tongue elevation, and a high palate. Mom reports breast feed her first 2 children for bout 5 weeks, and then having to supplement with formula. Mom was not pumping  Then. If her first 2 children also had some tongue restriction, this could be the cause of her developing a low milk supply.  I fitted this mom wiht a 20 nipple shield, and the baby was able to stay latched deeper, without slipping to the nipple. Mom aware of my findings, and did not want information on oral restrictions at this time.  I spoke to [employer] nurse, Juliette Alcide, and asked that she get a new weight, to see if the baby is still losing. If so, he probably needs to be supplemented with some formula. . Mom agreed to this plan.Mom knows to call for questions/concerns.    Maternal Data    Feeding Feeding Type: Breast Fed Length of feed:  (on deep, falls to shallow after a few minutes of sucking)  LATCH Score Latch: Repeated attempts needed to sustain latch, nipple held in mouth throughout feeding, stimulation needed to elicit sucking reflex.  Audible Swallowing: None  Type of Nipple: Everted at rest and after stimulation  Comfort (Breast/Nipple): Soft / non-tender  Hold (Positioning): Assistance needed to correctly position infant at breast and maintain latch. (some tenderness at beginning of feed)  LATCH Score: 6  Interventions Interventions: Breast feeding basics reviewed;Assisted with latch;Skin to skin;Hand express;Adjust position;Support pillows;Position options;Expressed milk;DEBP  Lactation Tools Discussed/Used     Consult Status Consult  Status: Follow-up Date: 12/21/16 Follow-up type: In-patient    Alfred Levins 12/20/2016, 4:31 PM

## 2016-12-20 NOTE — Progress Notes (Signed)
Subjective: Postpartum Day #1: Cesarean Delivery Patient reports tolerating PO and no problems voiding.  Breastfeeding going well; plans IUD for pp contraception. Denies dizziness with amb.  Objective: Vital signs in last 24 hours: Temp:  [97.7 F (36.5 C)-98.3 F (36.8 C)] 97.7 F (36.5 C) (09/22 0500) Pulse Rate:  [72-84] 84 (09/22 0500) Resp:  [14-16] 16 (09/22 0500) BP: (108-112)/(38-61) 110/55 (09/22 0500) SpO2:  [96 %-100 %] 96 % (09/21 2209)  Physical Exam:  General: alert, cooperative and no distress Lochia: appropriate Uterine Fundus: firm Incision: honeycomb intact, sm drainage but unchanged DVT Evaluation: No evidence of DVT seen on physical exam.   Recent Labs  12/19/16 0625 12/19/16 1006  HGB 9.7* 9.3*  HCT 29.8* 29.1*    Assessment/Plan: Status post Cesarean section. Doing well postoperatively.  Continue current care. Will begin Lovenox 40qd and continue for 6wks PP.  Cam Hai CNM 12/20/2016, 8:45 AM

## 2016-12-21 NOTE — Progress Notes (Signed)
POSTPARTUM PROGRESS NOTE  POD# 2  Subjective:  Abigail James is a 31 y.o. [id number] s/p pLTCS at [redacted]w[redacted]d.  No acute events overnight.  Pt denies problems with ambulating, voiding or po intake.  She denies nausea or vomiting.  Pain is moderately controlled.  She has had flatus. She has not had bowel movement.  Lochia Small.   Objective: Blood pressure (!) 111/58, pulse 77, temperature 98 F (36.7 C), temperature source Oral, resp. rate 18, height  (1.676 m), last menstrual period 03/25/2016, SpO2 97 %.  Physical Exam:  General: alert, cooperative and no distress Chest: no respiratory distress Heart:regular rate, distal pulses intact Abdomen: soft, nontender Uterine Fundus: firm, appropriately tender DVT Evaluation: No calf swelling or tenderness Extremities: no edema Skin: warm, dry; incision clean/dry/intact with honeycomb dressing in place   Recent Labs  12/19/16 0625 12/19/16 1006  HGB 9.7* 9.3*  HCT 29.8* 29.1*    Assessment/Plan: Abigail James is a 31 y.o. [id number] s/p primary LTCS at [redacted]w[redacted]d   POD#2 - Doing well. Encouraged ambulation Dispo: Plan for discharge tomorrow On prophylactic Lovenox d/t h/o DVT. Plan to continue 6 weeks PP   LOS: 4 days   Kandra Nicolas DegeleMD 12/21/2016, 8:17 AM

## 2016-12-21 NOTE — Lactation Note (Signed)
This note was copied from a baby's chart. Lactation Consultation Note  Patient Name: Boy Aida Lemaire [id number] Date: 12/21/2016 Reason for consult: Follow-up assessment Baby at 65 hr of life. Upon entry mom had baby latched in football to the R breast. Showed mom how to position baby to get a deeper latch. Mom reports bilateral nipple soreness, no skin break down or bruising noted, given coconut oil. Mom stated she used the NS and SNS on the L breast but baby still seemed hungry so she offered the R breast. She thinks her milk is transitioning. She was able to easily manually express multiple large drops of colostrum. She will offer the breast on demand q3hr with the NS/SNS, post pump, and supplement per volume guidelines.    Maternal Data    Feeding Feeding Type: Breast Fed Length of feed: 15 min  LATCH Score Latch: Grasps breast easily, tongue down, lips flanged, rhythmical sucking.  Audible Swallowing: A few with stimulation  Type of Nipple: Everted at rest and after stimulation  Comfort (Breast/Nipple): Soft / non-tender  Hold (Positioning): No assistance needed to correctly position infant at breast.  LATCH Score: 9  Interventions    Lactation Tools Discussed/Used Tools: Nipple Dorris Carnes;Supplemental Nutrition System Nipple shield size: 20   Consult Status Consult Status: Follow-up Date: 12/22/16 Follow-up type: In-patient    Rulon Eisenmenger 12/21/2016, 7:17 PM

## 2016-12-21 NOTE — Progress Notes (Signed)
Patient set up with SNS with 5 frewnch feeding tube and syringe. baby took 18 mls of formula instructed to wake up every 3 hours and feed

## 2016-12-22 ENCOUNTER — Encounter (HOSPITAL_COMMUNITY): Payer: Self-pay | Admitting: *Deleted

## 2016-12-22 MED ORDER — OXYCODONE-ACETAMINOPHEN 5-325 MG PO TABS: 1.0000 | ORAL_TABLET | ORAL | 0 refills | Status: DC | PRN

## 2016-12-22 MED ORDER — ENOXAPARIN SODIUM 40 MG/0.4ML ~~LOC~~ SOLN: 40.0000 mg | SUBCUTANEOUS | 1 refills | Status: DC

## 2016-12-22 NOTE — Lactation Note (Addendum)
This note was copied from a baby's chart. Lactation Consultation Note  Patient Name: Abigail James [id number] Date: 12/22/2016 Reason for consult: Follow-up assessment;Infant weight loss (9% weight loss . @ 32m hours BIli - 5.9 )  Baby awake and very hungry. LC assessed breast tissue and noted both nipples to be clear ( no complaints of  Sore nipples) areola compressible. Baby latched easily without the NS, with depth in the football position.  Multiple swallows noted, increased with breast compressions, 13F SNS added after baby latched, and Pace fed.  Baby fed 20 mins and finished the 12 ml and went to sleep.  LC reviewed sore nipple and engorgement prevention and tx . LC instructed mom on the use shells and hand pump.  Per mom has been using the #24 F and it has been comfortable.  LC and RN notified WIC rep to sign mom up for [facility] and for DEBP for post pumping due to weight loss.  Mother informed of post-discharge support and given phone number to the lactation department, including services for phone call assistance; out-patient appointments; and breastfeeding support group. List of other breastfeeding resources in the community given in the handout. Encouraged mother to call for problems or concerns related to breastfeeding. LC spoke to [facility] to see mom and sign her up , and provide a DEBP .    Maternal Data Has patient been taught Hand Expression?: Yes (easily , and mom able to return demo )  Feeding Feeding Type: Formula Length of feed: 20 min  LATCH Score Latch: Grasps breast easily, tongue down, lips flanged, rhythmical sucking.  Audible Swallowing: Spontaneous and intermittent  Type of Nipple: Everted at rest and after stimulation  Comfort (Breast/Nipple): Filling, red/small blisters or bruises, mild/mod discomfort  Hold (Positioning): Assistance needed to correctly position infant at breast and maintain latch.  LATCH Score: 8  Interventions Interventions: Breast feeding  basics reviewed;Assisted with latch;Skin to skin;Breast massage;Hand express;Breast compression;Adjust position;Support pillows;Position options;Hand pump;Shells;DEBP  Lactation Tools Discussed/Used Tools: 13F feeding tube / Syringe Nipple shield size:  (did not need NS for latch ) WIC Program:  (plan to have reps see mom this am ) Pump Review: Setup, frequency, and cleaning Initiated by:: Reviewed / MAI  Date initiated:: 12/22/16   Consult Status Consult Status: Follow-up Date: 12/22/16 Follow-up type: In-patient    Abigail James Abigail James 12/22/2016, 10:02 AM

## 2016-12-22 NOTE — Discharge Instructions (Signed)

## 2016-12-22 NOTE — Discharge Summary (Signed)
OB Discharge Summary     Patient Name: Abigail James DOB: 1985/04/14 MRN: [id number]  Date of admission: 12/17/2016 Delivering MD: Binford Bing   Date of discharge: 12/22/2016  Admitting diagnosis: 38.1WKS HIGH BP, MONITORING, LABS Intrauterine pregnancy: [redacted]w[redacted]d     Secondary diagnosis:  Active Problems:   Preeclampsia  Additional problems: Hx of DVT, on lovenox will continue until 6 weeks PP     Discharge diagnosis: Term Pregnancy Delivered and Gestational Hypertension                                                                                                Post partum procedures:None  Augmentation: AROM and Pitocin  Complications: None  Hospital course:  Induction of Labor With Cesarean Section  31 y.o. yo [id number] at [redacted]w[redacted]d was admitted to the hospital 12/17/2016 for induction of labor. Patient had a labor course significant for failure to descent. The patient went for cesarean section due to Arrest of Descent, and delivered a Viable infant,@BABYSUPPRESS (DBLINK,ept,110,,1,,) Membrane Rupture Time/Date: )6:11 PM ,12/18/2016    of operation can be found in separate operative Note.  Patient had an uncomplicated postpartum course. She is ambulating, tolerating a regular diet, passing flatus, and urinating well.  Patient is discharged home in stable condition on 12/22/16.                                    Physical exam  Vitals:   12/21/16 0500 12/21/16 1802 12/21/16 2008 12/22/16 0623  BP: (!) 111/58 123/67  129/82  Pulse: 77 81  81  Resp: Temp: 98 F (36.7 C) 98.3 F (36.8 C)  97.8 F (36.6 C)  TempSrc: Oral Oral  Oral  SpO2: 97%     Weight:   179 lb (81.2 kg)   Height:    (1.676 m)    General: alert, cooperative and no distress Lochia: appropriate Uterine Fundus: firm Incision: Healing well with no significant drainage DVT Evaluation: No evidence of DVT seen on physical exam. Labs: Lab Results  Component Value Date   WBC 34.8 (H)  12/19/2016   HGB 9.3 (L) 12/19/2016   HCT 29.1 (L) 12/19/2016   MCV 70.6 (L) 12/19/2016   PLT 191 12/19/2016   CMP Latest Ref Rng & Units 12/17/2016  Glucose 65 - 99 mg/dL 90  BUN 6 - 20 mg/dL 5(L)  Creatinine 1.30 - 1.00 mg/dL 8.65  Sodium 784 - 696 mmol/L 136  Potassium 3.5 - 5.1 mmol/L 3.3(L)  Chloride 101 - 111 mmol/L 106  CO2 22 - 32 mmol/L 19(L)  Calcium 8.9 - 10.3 mg/dL 9.0  Total Protein 6.5 - 8.1 g/dL 6.4(L)  Total Bilirubin 0.3 - 1.2 mg/dL 0.9  Alkaline Phos 38 - 126 U/L 100  AST 15 - 41 U/L 20  ALT 14 - 54 U/L 11(L)    Discharge instruction: per After Visit Summary and "Baby and Me Booklet".  After visit meds:  Allergies as of 12/22/2016      Reactions  Ciprofloxacin Anaphylaxis   Latex Swelling   Clindamycin/lincomycin    ? Reaction, - dizzy, facial tingling, nausea, severe chest pain   Vancomycin Nausea And Vomiting   Amoxicillin Rash   Penicillins Rash   Has patient had a PCN reaction causing immediate rash, facial/tongue/throat swelling, SOB or lightheadedness with hypotension: yes Has patient had a PCN reaction causing severe rash involving mucus membranes or skin necrosis:no Has patient had a PCN reaction that required hospitalization: unknown  Has patient had a PCN reaction occurring within the last 10 years: no If all of the above answers are "NO", then may proceed with Cephalosporin use.   Sulfa Antibiotics Swelling, Rash      Medication List    TAKE these medications   acetaminophen 325 MG tablet Commonly known as:  TYLENOL Take 650 mg by mouth every 6 (six) hours as needed for headache.   butalbital-acetaminophen-caffeine 50-325-40 MG tablet Commonly known as:  FIORICET, ESGIC Take 1-2 tablets by mouth every 6 (six) hours as needed for headache.   enoxaparin 40 MG/0.4ML injection Commonly known as:  LOVENOX Inject 0.4 mLs (40 mg total) into the skin daily. What changed:  medication strength  how much to take   oxyCODONE-acetaminophen  5-325 MG tablet Commonly known as:  PERCOCET/ROXICET Take 1-2 tablets by mouth every 4 (four) hours as needed for severe pain.   prenatal multivitamin Tabs tablet Take 1 tablet by mouth daily at 12 noon.   ranitidine 150 MG tablet Commonly known as:  ZANTAC Take 150 mg by mouth 2 (two) times daily.            Discharge Care Instructions        Start     Ordered   12/22/16 0000  Discharge patient    Question Answer Comment  Discharge disposition 01-Home or Self Care   Discharge patient date 12/22/2016      12/22/16 1016   12/22/16 0000  oxyCODONE-acetaminophen (PERCOCET/ROXICET) 5-325 MG tablet  Every 4 hours PRN    Question:  Supervising Provider  Answer:  Reva Bores   12/22/16 1016   12/22/16 0000  enoxaparin (LOVENOX) 40 MG/0.4ML injection  Every 24 hours    Question:  Supervising Provider  Answer:  Reva Bores   12/22/16 1016   12/17/16 0000  OB RESULT CONSOLE Group B Strep    Comments:  This external order was created through the Results Console.   12/17/16 2052   12/17/16 0000  OB RESULTS CONSOLE GC/Chlamydia    Comments:  This external order was created through the Results Console.   12/17/16 2052   12/17/16 0000  OB RESULTS CONSOLE RPR    Comments:  This external order was created through the Results Console.    12/17/16 2052   12/17/16 0000  OB RESULTS CONSOLE HIV antibody    Comments:  This external order was created through the Results Console.    12/17/16 2052   12/17/16 0000  OB RESULTS CONSOLE Rubella Antibody    Comments:  This external order was created through the Results Console.    12/17/16 2052   12/17/16 0000  OB RESULTS CONSOLE Hepatitis B surface antigen    Comments:  This external order was created through the Results Console.    12/17/16 2052   12/17/16 0000  OB RESULTS CONSOLE ABO/Rh    Comments:  This external order was created through the Results Console.    12/17/16 2052   12/17/16 0000  OB RESULTS CONSOLE  Antibody Screen     Comments:  This external order was created through the Results Console.    12/17/16 2052      Diet: routine diet  Activity: Advance as tolerated. Pelvic rest for 6 weeks.   Outpatient follow up:2 weeks Follow up Appt: Future Appointments Date Time Provider Department Center  01/02/2017 9:30 AM Levie Heritage, DO [facility] None  01/23/2017 10:15 AM Willodean Rosenthal, MD [facility] None   Follow up Visit:No Follow-up on file.  Postpartum contraception: IUD Mirena   Per pharmacy, ok for DC home on 40 of lovenox q day. RX sent to patient's pharmacy. Will continue until 6 weeks PP.   Newborn Data: Live born female  Birth Weight: 8 lb 15 oz (4054 g) APGAR: 8, 9  Baby Feeding: Breast Disposition:home with mother   12/22/2016 Thressa Sheller, CNM

## 2016-12-23 ENCOUNTER — Encounter (HOSPITAL_COMMUNITY): Payer: Self-pay | Admitting: *Deleted

## 2017-01-02 ENCOUNTER — Ambulatory Visit (INDEPENDENT_AMBULATORY_CARE_PROVIDER_SITE_OTHER): Payer: Medicaid Other | Admitting: Family Medicine

## 2017-01-02 ENCOUNTER — Encounter: Payer: Self-pay | Admitting: Family Medicine

## 2017-01-02 VITALS — BP 135/95 | HR 71 | Ht 64.0 in | Wt 150.0 lb

## 2017-01-02 DIAGNOSIS — Z5189 Encounter for other specified aftercare: Secondary | ICD-10-CM

## 2017-01-02 NOTE — Progress Notes (Signed)
   Subjective:    Patient ID: Abigail James, female    DOB: [birth year], 31 y.o.   MRN: [id number]  HPI  Patient is 2 weeks postop from a primary cesarean. She is here for a wound check. Complaints: none. Lochia light. No breast problems.   Review of Systems  Constitutional: Negative for chills and fever.  Gastrointestinal: Negative for abdominal pain, diarrhea, nausea and vomiting.  Genitourinary: Negative for dysuria, pelvic pain and vaginal discharge.       Objective:   Physical Exam  Constitutional: She appears well-developed and well-nourished.  Eyes: Pupils are equal, round, and reactive to light.  Cardiovascular: Normal rate.   Pulmonary/Chest: Effort normal.  Abdominal: Soft. She exhibits no distension. There is no tenderness. There is no rebound and no guarding.  Incision clean, dry intact.   Skin: Skin is warm and dry.  Psychiatric: She has a normal mood and affect. Her behavior is normal. Judgment and thought content normal.      Assessment & Plan:  1. Visit for wound check No complications. Followup in 2 weeks for postpartum check.

## 2017-01-19 ENCOUNTER — Ambulatory Visit (INDEPENDENT_AMBULATORY_CARE_PROVIDER_SITE_OTHER): Payer: Medicaid Other | Admitting: Family Medicine

## 2017-01-19 ENCOUNTER — Encounter: Payer: Self-pay | Admitting: Family Medicine

## 2017-01-19 VITALS — BP 128/76 | HR 73 | Ht 65.0 in | Wt 151.0 lb

## 2017-01-19 DIAGNOSIS — Z3202 Encounter for pregnancy test, result negative: Secondary | ICD-10-CM | POA: Diagnosis not present

## 2017-01-19 DIAGNOSIS — Z23 Encounter for immunization: Secondary | ICD-10-CM

## 2017-01-19 DIAGNOSIS — Z86711 Personal history of pulmonary embolism: Secondary | ICD-10-CM

## 2017-01-19 DIAGNOSIS — Z1389 Encounter for screening for other disorder: Secondary | ICD-10-CM

## 2017-01-19 DIAGNOSIS — Z3049 Encounter for surveillance of other contraceptives: Secondary | ICD-10-CM | POA: Diagnosis not present

## 2017-01-19 DIAGNOSIS — Z01812 Encounter for preprocedural laboratory examination: Secondary | ICD-10-CM

## 2017-01-19 DIAGNOSIS — O163 Unspecified maternal hypertension, third trimester: Secondary | ICD-10-CM

## 2017-01-19 LAB — POCT URINE PREGNANCY: PREG TEST UR: NEGATIVE

## 2017-01-19 MED ORDER — ETONOGESTREL 68 MG ~~LOC~~ IMPL
68.0000 mg | DRUG_IMPLANT | Freq: Once | SUBCUTANEOUS | Status: AC
  Administered 2017-01-19: 68 mg via SUBCUTANEOUS

## 2017-01-19 NOTE — Patient Instructions (Signed)
Nexplanon Instructions After Insertion  Keep bandage clean and dry for 24 hours  May use ice/Tylenol/Ibuprofen for soreness or pain  If you develop fever, drainage or increased warmth from incision site-contact office immediately   

## 2017-01-19 NOTE — Addendum Note (Signed)
Addended by: Anell BarrHOWARD, Joyia Riehle L on: 01/19/2017 10:45 AM   Modules accepted: Orders

## 2017-01-19 NOTE — Addendum Note (Signed)
Addended by: Anell BarrHOWARD, Jowana Thumma L on: 01/19/2017 10:53 AM   Modules accepted: Orders, SmartSet

## 2017-01-19 NOTE — Progress Notes (Signed)
Post Partum Exam  Abigail James is a 31 y.o. [phone] female who presents for a postpartum visit. She is 4 weeks postpartum following a low cervical transverse Cesarean section. I have fully reviewed the prenatal and intrapartum course. The delivery was at 38.1 gestational weeks.  Anesthesia: epidural. Postpartum course has been uneventful. Baby's course has been complicated by jaundice . Baby is feeding by bottle. Bleeding no bleeding. Bowel function is normal. Bladder function is normal. Patient is sexually active. Contraception method is nexplanon. Postpartum depression screening:neg (score 9)  Patient hasn't been taking lovenox injections consistently.  The following portions of the patient's history were reviewed and updated as appropriate: allergies, current medications, past family history, past medical history, past social history, past surgical history and problem list.  Review of Systems Pertinent items are noted in HPI.    Objective:  Blood pressure 128/76, pulse 73, height 5\' 5"  (1.651 m), weight 151 lb (68.5 kg), not currently breastfeeding.  General:  alert, cooperative and no distress  Lungs: clear to auscultation bilaterally and normal percussion bilaterally  Heart:  regular rate and rhythm, S1, S2 normal, no murmur, click, rub or gallop  Abdomen: soft, non-tender; bowel sounds normal; no masses,  no organomegaly. Incision well healed.         Nexplanon Insertion:  Patient given informed consent, signed copy in the chart, time out was performed. Pregnancy test was negative. Appropriate time out taken.  Patient's left arm was prepped and draped in the usual sterile fashion.. The ruler used to measure and mark insertion area.  Pt was prepped with alcohol swab and then injected with 3 cc of 1% lidocaine with epinephrine.  Pt was prepped with betadine, Implanon removed form packaging,  Device confirmed in needle, then inserted full length of needle and withdrawn per handbook  instructions.  Device palpated by physician and patient.  Pt insertion site covered with pressure dressing.   Minimal blood loss.  Pt tolerated the procedure well.    Assessment:    Normal postpartum exam. Pap smear not done at today's visit.   Plan:   1. Contraception: Nexplanon 2. Recommended compliance with lovenox for two more weeks, then stop. 3. BP normal 4. Follow up in: 2 month or as needed.

## 2017-01-23 ENCOUNTER — Ambulatory Visit: Payer: Medicaid Other | Admitting: Obstetrics & Gynecology

## 2017-03-16 ENCOUNTER — Ambulatory Visit: Payer: Medicaid Other | Admitting: Family Medicine

## 2017-12-29 ENCOUNTER — Ambulatory Visit (INDEPENDENT_AMBULATORY_CARE_PROVIDER_SITE_OTHER): Payer: Self-pay | Admitting: Physician Assistant

## 2017-12-29 VITALS — BP 121/80 | HR 78 | Temp 98.7°F | Resp 18 | Ht 66.0 in | Wt 146.6 lb

## 2017-12-29 DIAGNOSIS — J22 Unspecified acute lower respiratory infection: Secondary | ICD-10-CM

## 2017-12-29 MED ORDER — PREDNISONE 50 MG PO TABS: 50.0000 mg | ORAL_TABLET | Freq: Every day | ORAL | 0 refills | Status: AC

## 2017-12-29 MED ORDER — AZITHROMYCIN 250 MG PO TABS: ORAL_TABLET | ORAL | 0 refills | Status: AC

## 2017-12-29 NOTE — Progress Notes (Signed)
12/29/2017 10:14 AM   DOB: 1985/05/01 / MRN: [id number]  SUBJECTIVE:  Abigail James is a 32 y.o. female presenting for cough which started and remissed about 10 days ago and now has returned roughly 2-3 days ago. Denies fever, chest pain, SOB, DOE, leg swelling.  Feels that she is getting worse. Hx of DVT per [facility].  No leg pain today. Former smoker.    She is allergic to ciprofloxacin; latex; clindamycin/lincomycin; vancomycin; amoxicillin; penicillins; and sulfa antibiotics.   She  has a past medical history of DVT (deep venous thrombosis) (HCC).    She  reports that she quit smoking about 3 years ago. She has never used smokeless tobacco. She reports that she drinks alcohol. She reports that she does not use drugs. She  reports that she currently engages in sexual activity. She reports using the following method of birth control/protection: None. The patient  has a past surgical history that includes Tympanostomy tube placement; Foot surgery; Leg Surgery; Dilation and curettage of uterus; Appendectomy; Cholecystectomy; and Cesarean section (N/A, 12/19/2016).  Her family history includes Cancer in her maternal grandmother and paternal grandmother; Diabetes in her paternal grandmother; Hypertension in her brother, father, maternal uncle, and paternal grandmother.  Review of Systems  Constitutional: Negative for chills, diaphoresis and fever.  Respiratory: Positive for cough and sputum production. Negative for hemoptysis, shortness of breath and wheezing.   Cardiovascular: Negative for chest pain, orthopnea and leg swelling.  Gastrointestinal: Negative for nausea.  Skin: Negative for rash.  Neurological: Negative for dizziness.    The problem list and medications were reviewed and updated by myself where necessary and exist elsewhere in the encounter.   OBJECTIVE:  BP 121/80 (BP Location: Right Arm, Patient Position: Sitting)   Pulse 78   Temp 98.7 F (37.1 C)   Resp 18   Ht 5\' 6"   (1.676 m)   Wt 146 lb 9.6 oz (66.5 kg)   SpO2 100%   BMI 23.66 kg/m   Wt Readings from Last 3 Encounters:  12/29/17 146 lb 9.6 oz (66.5 kg)  01/19/17 151 lb (68.5 kg)  01/02/17 150 lb (68 kg)   Temp Readings from Last 3 Encounters:  12/29/17 98.7 F (37.1 C)  12/22/16 97.8 F (36.6 C) (Oral)  11/27/16 98.2 F (36.8 C) (Oral)   BP Readings from Last 3 Encounters:  12/29/17 121/80  01/19/17 128/76  01/02/17 (!) 135/95   Pulse Readings from Last 3 Encounters:  12/29/17 78  01/19/17 73  01/02/17 71    Physical Exam  Constitutional: She is oriented to person, place, and time. She appears well-nourished. No distress.  HENT:  Head: Normocephalic.  Right Ear: External ear normal.  Left Ear: External ear normal.  Nose: Nose normal.  Mouth/Throat: Oropharynx is clear and moist. No oropharyngeal exudate.  Eyes: Pupils are equal, round, and reactive to light. EOM are normal.  Neck: Normal range of motion.  Cardiovascular: Normal rate, regular rhythm, normal heart sounds and intact distal pulses. Exam reveals no gallop and no friction rub.  No murmur heard. Pulmonary/Chest: Effort normal. No stridor. No respiratory distress. She has no wheezes. She has no rales. She exhibits no tenderness.  Bilateral rhonci noted.   Abdominal: She exhibits no distension.  Musculoskeletal: She exhibits no edema or tenderness.  Neurological: She is alert and oriented to person, place, and time. No cranial nerve deficit. Gait normal.  Skin: Skin is dry. She is not diaphoretic.  Psychiatric: She has a normal mood  and affect.  Vitals reviewed.   No results found for: HGBA1C  Lab Results  Component Value Date   WBC 34.8 (H) 12/19/2016   HGB 9.3 (L) 12/19/2016   HCT 29.1 (L) 12/19/2016   MCV 70.6 (L) 12/19/2016   PLT 191 12/19/2016    Lab Results  Component Value Date   CREATININE 0.55 12/17/2016   BUN 5 (L) 12/17/2016   NA 136 12/17/2016   K 3.3 (L) 12/17/2016   CL 106 12/17/2016    CO2 19 (L) 12/17/2016    Lab Results  Component Value Date   ALT 11 (L) 12/17/2016   AST 20 12/17/2016   ALKPHOS 100 12/17/2016   BILITOT 0.9 12/17/2016    No results found for: TSH  No results found for: CHOL, HDL, LDLCALC, LDLDIRECT, TRIG, CHOLHDL   ASSESSMENT AND PLAN:  Diagnoses and all orders for this visit:  LRTI (lower respiratory tract infection): History of DVT but no chest pain, cough is productive of sputum, no leg swelling and no tachycardia.  Husband is an NP and feels that she would benefit from macrolide given double sickening.  RTC and ED precautions discussed.  -     azithromycin (ZITHROMAX) 250 MG tablet; Take two tabs on day on and one daily thereafter. -     predniSONE (DELTASONE) 50 MG tablet; Take 1 tablet (50 mg total) by mouth daily with breakfast for 5 days.    The patient is advised to call or return to clinic if she does not see an improvement in symptoms, or to seek the care of the closest emergency department if she worsens with the above plan.   Deliah Boston, MHS, PA-C Primary [facility] at [facility] 12/29/2017 10:14 AM

## 2018-02-05 ENCOUNTER — Encounter: Payer: Self-pay | Admitting: Family Medicine

## 2018-02-05 ENCOUNTER — Ambulatory Visit (INDEPENDENT_AMBULATORY_CARE_PROVIDER_SITE_OTHER): Payer: Medicaid Other | Admitting: Family Medicine

## 2018-02-05 VITALS — BP 128/73 | Temp 79.0°F | Ht 66.0 in | Wt 147.0 lb

## 2018-02-05 DIAGNOSIS — Z3046 Encounter for surveillance of implantable subdermal contraceptive: Secondary | ICD-10-CM | POA: Diagnosis not present

## 2018-02-05 MED ORDER — ETONOGESTREL-ETHINYL ESTRADIOL 0.12-0.015 MG/24HR VA RING: VAGINAL_RING | VAGINAL | 3 refills | Status: DC

## 2018-02-05 NOTE — Patient Instructions (Signed)
Nexplanon Instructions After Insertion  Keep bandage clean and dry for 24 hours  May use ice/Tylenol/Ibuprofen for soreness or pain  If you develop fever, drainage or increased warmth from incision site-contact office immediately   

## 2018-02-05 NOTE — Progress Notes (Signed)
Patient Name: Abigail James, female   DOB: December 19, 1985, 32 y.o.  MRN: [id number]  Patient having rxn to nexplanon, numbness in left arm, rash. Thinking about nuvaring.    Nexplanon Removal:  Patient given informed consent for removal of her Implanon, time out was performed.  Signed copy in the chart.  Appropriate time out taken. Implanon site identified.  Area prepped in usual sterile fashon. One cc of 1% lidocaine was used to anesthetize the area at the distal end of the implant. A small stab incision was made right beside the implant on the distal portion.  The implanon rod was grasped using hemostats and removed without difficulty.  There was less than 3 cc blood loss. There were no complications.  A small amount of antibiotic ointment and steri-strips were applied over the small incision.  A pressure bandage was applied to reduce any bruising.  The patient tolerated the procedure well and was given post procedure instructions.

## 2018-03-25 ENCOUNTER — Encounter: Payer: Self-pay | Admitting: Physician Assistant

## 2018-03-25 ENCOUNTER — Ambulatory Visit: Payer: Self-pay | Admitting: Physician Assistant

## 2018-03-25 VITALS — BP 100/70 | HR 75 | Temp 98.3°F | Resp 18 | Wt 143.6 lb

## 2018-03-25 DIAGNOSIS — J069 Acute upper respiratory infection, unspecified: Secondary | ICD-10-CM

## 2018-03-25 MED ORDER — AZELASTINE HCL 0.1 % NA SOLN: 2.0000 | Freq: Two times a day (BID) | NASAL | 12 refills | Status: DC

## 2018-03-25 MED ORDER — PSEUDOEPH-BROMPHEN-DM 30-2-10 MG/5ML PO SYRP: 5.0000 mL | ORAL_SOLUTION | Freq: Four times a day (QID) | ORAL | 0 refills | Status: DC | PRN

## 2018-03-25 MED ORDER — BENZONATATE 100 MG PO CAPS: 100.0000 mg | ORAL_CAPSULE | Freq: Three times a day (TID) | ORAL | 0 refills | Status: DC | PRN

## 2018-03-25 MED ORDER — FLUTICASONE PROPIONATE 50 MCG/ACT NA SUSP: 2.0000 | Freq: Every day | NASAL | 0 refills | Status: DC

## 2018-03-25 NOTE — Patient Instructions (Addendum)
- We will treat this as a viral upper respiratory infection, more commonly known as the common cold.  Most colds last for three to seven days, although many people have residual symptoms such as coughing, sneezing, nasal or chest congestion for up to two weeks. Some viruses that cause the common cold can also depress the immune system or cause swelling in the lining of the nose or airways; this can, in turn, lead to a secondary viral or bacterial infection. Please let me know if you are not seeing any improvement in 7-10 days or sooner if any symptoms worsen or you develop new concerning symptoms. There is no specific treatment for the viruses that cause the common cold. Most treatments are aimed at relieving some of the symptoms of the cold but do not shorten or cure the cold. Antibiotics are not useful for treating the common cold; antibiotics are only used to treat illnesses caused by bacteria, not viruses. Unnecessary use of antibiotics for the treatment of the common cold can cause allergic reactions, diarrhea, or other gastrointestinal symptoms in some patients.  - For now, I recommend you rest, drink plenty of fluids, eat light meals, including soups, and use supportive measures discussed below.   Runny nose and nasal congestion - Use astelin nasal spray during the day. Saline nasal sprays can also be helpful to relieve runny nose and congestion.  Sore throat and headache - Sore throat and headache are best treated with a mild pain reliever such as over the counter Tylenol, ibuprofen, Motrin or Aleve. You may also use over the counter cepacol throat lozenges. Tea recipe for sore throat: boil water, add 2 inches shaved ginger root, steep 15 minutes, add juice from 2 full lemons, and 2 tbsp honey.  Cough - You may use prescription cough syrup throughout the day for cough. Over the counter mucinex is an expectorant and will help thin the mucus, making it easier to clear from the head, throat, and  lungs.Prescription Tessalon perles can be used during the day to help suppress the cough.  Be aware that some cough syrups can make you drowsy and sleepy so do not drive or operate any heavy machinery if it is affecting you during the day.  -Some homemade cough syrups can be just as effective as the over-the-counter medications with much fewer side effects. Also they can be combined with over-the-counter and prescription cough regimens, to make them even more effective together. Sweet lemon, honey, and thyme cough syrup recipe: Place half of a chopped lemon in a container and cover with half a cup of honey (raw is best but any will do). Then boil a handful of fresh thyme sprigs or organic dry leaves with 2 cups of water uncovered until it is reduced to one cup. Strain this liquid into the jar with the lemon and honey to remove the steams and leaves and then shake it up. Use 1 teaspoonful as often as needed and it will store in the refrigerator for at least a month.    Antibiotics should not be used to treat an uncomplicated common cold. As noted above, colds are caused by viruses. Antibiotics treat bacterial, not viral infections.  PREVENTION Hand washing is an essential and highly effective way to prevent the spread of most infections, including the common cold. Hands should be wet with water and plain soap, and rubbed together for 15 to 30 seconds. Special attention should be paid to the fingernails, between the fingers, and the wrists. Hands  should be rinsed thoroughly and dried with a single-use towel.In addition, tissues should be used to cover the mouth when sneezing or coughing. These used tissues should be disposed of promptly. Sneezing/coughing into the sleeve of one's clothing (at the inner elbow) does not contaminate the hands and is a good way of containing sprays of saliva and secretions.   Upper Respiratory Infection, Adult An upper respiratory infection (URI) affects the nose, throat, and  upper air passages. URIs are caused by germs (viruses). The most common type of URI is often called "the common cold." Medicines cannot cure URIs, but you can do things at home to relieve your symptoms. URIs usually get better within 7-10 days. Follow these instructions at home: Activity  Rest as needed.  If you have a fever, stay home from work or school until your fever is gone, or until your doctor says you may return to work or school. ? You should stay home until you cannot spread the infection anymore (you are not contagious). ? Your doctor may have you wear a face mask so you have less risk of spreading the infection. Relieving symptoms  Gargle with a salt-water mixture 3-4 times a day or as needed. To make a salt-water mixture, completely dissolve -1 tsp of salt in 1 cup of warm water.  Use a cool-mist humidifier to add moisture to the air. This can help you breathe more easily. Eating and drinking   Drink enough fluid to keep your pee (urine) pale yellow.  Eat soups and other clear broths. General instructions   Take over-the-counter and prescription medicines only as told by your doctor. These include cold medicines, fever reducers, and cough suppressants.  Do not use any products that contain nicotine or tobacco. These include cigarettes and e-cigarettes. If you need help quitting, ask your doctor.  Avoid being where people are smoking (avoid secondhand smoke).  Make sure you get regular shots and get the flu shot every year.  Keep all follow-up visits as told by your doctor. This is important. How to avoid spreading infection to others   Wash your hands often with soap and water. If you do not have soap and water, use hand sanitizer.  Avoid touching your mouth, face, eyes, or nose.  Cough or sneeze into a tissue or your sleeve or elbow. Do not cough or sneeze into your hand or into the air. Contact a doctor if:  You are getting worse, not better.  You have  any of these: ? A fever. ? Chills. ? Brown or red mucus in your nose. ? Yellow or brown fluid (discharge)coming from your nose. ? Pain in your face, especially when you bend forward. ? Swollen neck glands. ? Pain with swallowing. ? White areas in the back of your throat. Get help right away if:  You have shortness of breath that gets worse.  You have very bad or constant: ? Headache. ? Ear pain. ? Pain in your forehead, behind your eyes, and over your cheekbones (sinus pain). ? Chest pain.  You have long-lasting (chronic) lung disease along with any of these: ? Wheezing. ? Long-lasting cough. ? Coughing up blood. ? A change in your usual mucus.  You have a stiff neck.  You have changes in your: ? Vision. ? Hearing. ? Thinking. ? Mood. Summary  An upper respiratory infection (URI) is caused by a germ called a virus. The most common type of URI is often called "the common cold."  URIs usually get  better within 7-10 days.  Take over-the-counter and prescription medicines only as told by your doctor. This information is not intended to replace advice given to you by your health care provider. Make sure you discuss any questions you have with your health care provider. Document Released: 09/03/2007 Document Revised: 11/07/2016 Document Reviewed: 11/07/2016 Elsevier Interactive Patient Education  2019 [employer].

## 2018-03-25 NOTE — Progress Notes (Signed)
MRN: [id number] DOB: 03-Jul-1985  Subjective:   Abigail LoftsAmber Marijean HeathM Gorum is a 32 y.o. female presenting for chief complaint of Headache (x 2 days); Sore Throat (x 2 days); Cough (x 2 days); Nasal Congestion (x 2 days); and chest congetsion (x 2 days) .  Reports 2 day history of gradual onset nasal congestion, sore throat, dry cough, and headache. Denies fever, chills, SOB, chest pain, heart palpitations, sinus pain, ear pain, N/V/D.  Has tried mucinex and tylenol with some relief. 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 PMH of exercise induced asthma. No PMH of seasonal allergies. Patient has had flu shot this season. Former smoker, quit 2017. Denies any other aggravating or relieving factors, no other questions or concerns.  Review of Systems  Respiratory: Negative for hemoptysis, sputum production and wheezing.   Cardiovascular: Negative for leg swelling.  Musculoskeletal: Negative for myalgias.  Neurological: Negative for dizziness.     Journi has a current medication list which includes the following prescription(s): etonogestrel-ethinyl estradiol, guaifenesin, ibuprofen, azelastine, benzonatate, brompheniramine-pseudoephedrine-dm, and etonogestrel. Also is allergic to ciprofloxacin; latex; clindamycin/lincomycin; vancomycin; amoxicillin; penicillins; and sulfa antibiotics.  Preslyn  has a past medical history of DVT (deep venous thrombosis) (HCC). Also  has a past surgical history that includes Tympanostomy tube placement; Foot surgery; Leg Surgery; Dilation and curettage of uterus; Appendectomy; Cholecystectomy; and Cesarean section (N/A, 12/19/2016).   Objective:   Vitals: BP 100/70 (BP Location: Right Arm, Patient Position: Sitting, Cuff Size: Normal)   Pulse 75   Temp 98.3 F (36.8 C)   Resp 18   Wt 143 lb 9.6 oz (65.1 kg)   SpO2 96%   BMI 23.18 kg/m   Physical Exam Vitals signs reviewed.  Constitutional:      General: She is not in acute  distress.    Appearance: She is well-developed. She is not ill-appearing.  HENT:     Head: Normocephalic and atraumatic.     Right Ear: Tympanic membrane, ear canal and external ear normal.     Left Ear: Tympanic membrane, ear canal and external ear normal.     Nose: Mucosal edema and congestion present.     Right Sinus: No maxillary sinus tenderness or frontal sinus tenderness.     Left Sinus: No maxillary sinus tenderness or frontal sinus tenderness.     Mouth/Throat:     Lips: Pink.     Mouth: Mucous membranes are moist.     Pharynx: Uvula midline. Posterior oropharyngeal erythema present.     Tonsils: No tonsillar exudate or tonsillar abscesses. Swelling: 1+ on the right. 1+ on the left.  Eyes:     Conjunctiva/sclera: Conjunctivae normal.  Neck:     Musculoskeletal: Normal range of motion.  Cardiovascular:     Rate and Rhythm: Normal rate and regular rhythm.     Heart sounds: Normal heart sounds.  Pulmonary:     Effort: Pulmonary effort is normal.     Breath sounds: Normal breath sounds. No decreased breath sounds, wheezing, rhonchi or rales.  Lymphadenopathy:     Head:     Right side of head: No submental, submandibular, tonsillar, preauricular, posterior auricular or occipital adenopathy.     Left side of head: No submental, submandibular, tonsillar, preauricular, posterior auricular or occipital adenopathy.     Cervical: No cervical adenopathy.     Upper Body:     Right upper body: No supraclavicular adenopathy.     Left upper body: No supraclavicular adenopathy.  Skin:    General: Skin is warm and dry.  Neurological:     Mental Status: She is alert and oriented to person, place, and time.     No results found for this or any previous visit (from the past 24 hour(s)).  Assessment and Plan :  1. Viral upper respiratory tract infection Pt overall well appearing, NAD. VSS. Lungs CTAB. Hx and PE findings suggestive of viral URI.  - Advised supportive care, offered  symptomatic relief. - Return to clinic if symptoms worsen or fail to improve in 7- 10 days. - brompheniramine-pseudoephedrine-DM 30-2-10 MG/5ML syrup; Take 5 mLs by mouth 4 (four) times daily as needed.  Dispense: 120 mL; Refill: 0 - benzonatate (TESSALON) 100 MG capsule; Take 1-2 capsules (100-200 mg total) by mouth 3 (three) times daily as needed for cough.  Dispense: 40 capsule; Refill: 0 - azelastine (ASTELIN) 0.1 % nasal spray; Place 2 sprays into both nostrils 2 (two) times daily. Use in each nostril as directed  Dispense: 30 mL; Refill: 12  Benjiman CoreBrittany Wiseman, Cordelia Poche-C  [facility] 03/25/2018 12:19 PM

## 2019-01-20 ENCOUNTER — Other Ambulatory Visit (HOSPITAL_COMMUNITY)
Admission: RE | Admit: 2019-01-20 | Discharge: 2019-01-20 | Disposition: A | Discharge status: Home / Self Care | Payer: Medicaid Other | Source: Ambulatory Visit | Attending: Family Medicine | Admitting: Family Medicine

## 2019-01-20 ENCOUNTER — Other Ambulatory Visit: Payer: Self-pay

## 2019-01-20 ENCOUNTER — Encounter: Payer: Self-pay | Admitting: Family Medicine

## 2019-01-20 ENCOUNTER — Ambulatory Visit (INDEPENDENT_AMBULATORY_CARE_PROVIDER_SITE_OTHER): Payer: Medicaid Other | Admitting: Family Medicine

## 2019-01-20 VITALS — BP 131/82 | HR 83 | Ht 66.0 in | Wt 150.1 lb

## 2019-01-20 DIAGNOSIS — Z0000 Encounter for general adult medical examination without abnormal findings: Secondary | ICD-10-CM

## 2019-01-20 DIAGNOSIS — Z01419 Encounter for gynecological examination (general) (routine) without abnormal findings: Secondary | ICD-10-CM | POA: Diagnosis present

## 2019-01-20 DIAGNOSIS — Z86711 Personal history of pulmonary embolism: Secondary | ICD-10-CM

## 2019-01-20 DIAGNOSIS — D892 Hypergammaglobulinemia, unspecified: Secondary | ICD-10-CM

## 2019-01-20 MED ORDER — ETONOGESTREL-ETHINYL ESTRADIOL 0.12-0.015 MG/24HR VA RING
VAGINAL_RING | VAGINAL | 3 refills | Status: DC
Start: 2019-01-20 — End: 2020-02-01

## 2019-01-20 NOTE — Progress Notes (Signed)
GYNECOLOGY ANNUAL PREVENTATIVE CARE ENCOUNTER NOTE  Subjective:   Abigail James is a 33 y.o. [phone] female here for a routine annual gynecologic exam.  Current complaints: none.   Denies abnormal vaginal bleeding, discharge, pelvic pain, problems with intercourse or other gynecologic concerns.    Gynecologic History No LMP recorded. Patient is sexually active  Contraception: NuvaRing vaginal inserts Last Pap: 2018. Results were: normal Last mammogram: n/a.  Obstetric History OB History  Gravida Para Term Preterm AB Living  4 3 2 1 1 3   SAB TAB Ectopic Multiple Live Births  1 0 0 0 3    # Outcome Date GA Lbr Len/2nd Weight Sex Delivery Anes PTL Lv  4 Term 12/19/16 [id number] 14:22 / 05:23 8 lb 15 oz (4.054 kg) M CS-LTranv EPI  LIV  3 SAB  [id number]         2 Preterm      Vag-Spont   LIV  1 Term      Vag-Spont   LIV    Past Medical History:  Diagnosis Date  . DVT (deep venous thrombosis) (HCC)     Past Surgical History:  Procedure Laterality Date  . APPENDECTOMY    . CESAREAN SECTION N/A 12/19/2016   Procedure: CESAREAN SECTION;  Surgeon: [place] BingPickens, Charlie, MD;  [facility];  Service: Obstetrics;  Laterality: N/A;  . CHOLECYSTECTOMY    . DILATION AND CURETTAGE OF UTERUS    . FOOT SURGERY    . LEG SURGERY    . TYMPANOSTOMY TUBE PLACEMENT      Current Outpatient Medications on File Prior to Visit  Medication Sig Dispense Refill  . guaiFENesin (MUCINEX) 600 MG 12 hr tablet Take by mouth 2 (two) times daily.    Marland Kitchen. ibuprofen (ADVIL,MOTRIN) 200 MG tablet Take 200 mg by mouth every 6 (six) hours as needed.     No current facility-administered medications on file prior to visit.     Allergies  Allergen Reactions  . Ciprofloxacin Anaphylaxis  . Latex Swelling  . Clindamycin/Lincomycin     ? Reaction, - dizzy, facial tingling, nausea, severe chest pain  . Vancomycin Nausea And Vomiting  . Amoxicillin Rash  . Penicillins Rash    Has patient had a PCN  reaction causing immediate rash, facial/tongue/throat swelling, SOB or lightheadedness with hypotension: yes Has patient had a PCN reaction causing severe rash involving mucus membranes or skin necrosis:no Has patient had a PCN reaction that required hospitalization: unknown  Has patient had a PCN reaction occurring within the last 10 years: no If all of the above answers are "NO", then may proceed with Cephalosporin use.   . Sulfa Antibiotics Swelling and Rash    Social History   Socioeconomic History  . Marital status: Single    Spouse name: Not on file  . Number of children: Not on file  . Years of education: Not on file  . Highest education level: Not on file  Occupational History  . Not on file  Social Needs  . Financial resource strain: Not on file  . Food insecurity    Worry: Not on file    Inability: Not on file  . Transportation needs    Medical: Not on file    Non-medical: Not on file  Tobacco Use  . Smoking status: Former Smoker    Quit date: 04/08/2014    Years since quitting: 4.7  . Smokeless tobacco: Never Used  Substance and Sexual Activity  . Alcohol use:  Yes    Comment: occasional  . Drug use: No  . Sexual activity: Yes    Birth control/protection: None  Lifestyle  . Physical activity    Days per week: Not on file    Minutes per session: Not on file  . Stress: Not on file  Relationships  . Social Musician on phone: Not on file    Gets together: Not on file    Attends religious service: Not on file    Active member of club or organization: Not on file    Attends meetings of clubs or organizations: Not on file    Relationship status: Not on file  . Intimate partner violence    Fear of current or ex partner: Not on file    Emotionally abused: Not on file    Physically abused: Not on file    Forced sexual activity: Not on file  Other Topics Concern  . Not on file  Social History Narrative   ** Merged History Encounter **         Family History  Problem Relation Age of Onset  . Hypertension Father   . Hypertension Brother   . Hypertension Maternal Uncle   . Cancer Maternal Grandmother   . Hypertension Paternal Grandmother   . Diabetes Paternal Grandmother   . Cancer Paternal Grandmother     The following portions of the patient's history were reviewed and updated as appropriate: allergies, current medications, past family history, past medical history, past social history, past surgical history and problem list.  Review of Systems Pertinent items are noted in HPI.   Objective:  BP 131/82   Pulse 83   Ht 5\' 6"  (1.676 m)   Wt 150 lb 1.9 oz (68.1 kg)   BMI 24.23 kg/m  Wt Readings from Last 3 Encounters:  01/20/19 150 lb 1.9 oz (68.1 kg)  03/25/18 143 lb 9.6 oz (65.1 kg)  02/05/18 147 lb 0.6 oz (66.7 kg)     CONSTITUTIONAL: Well-developed, well-nourished female in no acute distress.  HENT:  Normocephalic, atraumatic, External right and left ear normal. Oropharynx is clear and moist EYES: Conjunctivae and EOM are normal. Pupils are equal, round, and reactive to light. No scleral icterus.  NECK: Normal range of motion, supple, no masses.  Normal thyroid.   CARDIOVASCULAR: Normal heart rate noted, regular rhythm RESPIRATORY: Clear to auscultation bilaterally. Effort and breath sounds normal, no problems with respiration noted. BREASTS: Symmetric in size. No masses, skin changes, nipple drainage, or lymphadenopathy. ABDOMEN: Soft, normal bowel sounds, no distention noted.  No tenderness, rebound or guarding.  PELVIC: Normal appearing external genitalia; normal appearing vaginal mucosa and cervix.  No abnormal discharge noted.  Normal uterine size, no other palpable masses, no uterine or adnexal tenderness. MUSCULOSKELETAL: Normal range of motion. No tenderness.  No cyanosis, clubbing, or edema.  2+ distal pulses. SKIN: Skin is warm and dry. No rash noted. Not diaphoretic. No erythema. No pallor. NEUROLOGIC:  Alert and oriented to person, place, and time. Normal reflexes, muscle tone coordination. No cranial nerve deficit noted. PSYCHIATRIC: Normal mood and affect. Normal behavior. Normal judgment and thought content.  Assessment:  Annual gynecologic examination with pap smear   Plan:  1. Well Woman Exam Will follow up results of pap smear and manage accordingly. - Cytology - PAP( [place])  2. Personal history of pulmonary embolism This diagnosis is not confirmed - patient states that she never had this and that hematology at [facility] (Dr  Ma) couldn't find any evidence of this on records. Dr Lynann Beaver notes are not available in [place]. Will get her notes. Will continue with nuvaring for now, as patient cleared by hematology to not need lovenox.  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   Routine preventative health maintenance measures emphasized. Please refer to After Visit Summary for other counseling recommendations.    Loma Boston, [place] for [employer]

## 2019-01-25 LAB — CYTOLOGY - PAP
Adequacy: ABSENT
Chlamydia: NEGATIVE
Comment: NEGATIVE
Comment: NEGATIVE
Comment: NORMAL
Diagnosis: NEGATIVE
High risk HPV: NEGATIVE
Neisseria Gonorrhea: NEGATIVE

## 2019-02-07 ENCOUNTER — Other Ambulatory Visit: Payer: Self-pay

## 2019-02-07 DIAGNOSIS — Z20822 Contact with and (suspected) exposure to covid-19: Secondary | ICD-10-CM

## 2019-02-08 LAB — NOVEL CORONAVIRUS, NAA: SARS-CoV-2, NAA: NOT DETECTED

## 2019-02-09 ENCOUNTER — Telehealth: Payer: Self-pay | Admitting: General Practice

## 2019-02-09 NOTE — Telephone Encounter (Signed)
Gave patient negative covid test results Patient understood 

## 2019-03-30 ENCOUNTER — Encounter (HOSPITAL_COMMUNITY): Payer: Self-pay

## 2019-03-30 ENCOUNTER — Other Ambulatory Visit: Payer: Self-pay

## 2019-03-30 ENCOUNTER — Emergency Department (HOSPITAL_COMMUNITY): Payer: Medicaid Other

## 2019-03-30 ENCOUNTER — Emergency Department (HOSPITAL_COMMUNITY)
Admission: EM | Admit: 2019-03-30 | Discharge: 2019-03-30 | Disposition: A | Discharge status: Home / Self Care | Payer: Medicaid Other | Attending: Emergency Medicine | Admitting: Emergency Medicine

## 2019-03-30 DIAGNOSIS — K529 Noninfective gastroenteritis and colitis, unspecified: Secondary | ICD-10-CM

## 2019-03-30 DIAGNOSIS — R197 Diarrhea, unspecified: Secondary | ICD-10-CM | POA: Diagnosis not present

## 2019-03-30 DIAGNOSIS — Z91040 Latex allergy status: Secondary | ICD-10-CM | POA: Insufficient documentation

## 2019-03-30 DIAGNOSIS — R112 Nausea with vomiting, unspecified: Secondary | ICD-10-CM | POA: Insufficient documentation

## 2019-03-30 DIAGNOSIS — Z87891 Personal history of nicotine dependence: Secondary | ICD-10-CM | POA: Diagnosis not present

## 2019-03-30 DIAGNOSIS — R1033 Periumbilical pain: Secondary | ICD-10-CM | POA: Diagnosis present

## 2019-03-30 DIAGNOSIS — Z79899 Other long term (current) drug therapy: Secondary | ICD-10-CM | POA: Diagnosis not present

## 2019-03-30 LAB — COMPREHENSIVE METABOLIC PANEL
ALT: 13 U/L (ref 0–44)
AST: 14 U/L — ABNORMAL LOW (ref 15–41)
Albumin: 4.1 g/dL (ref 3.5–5.0)
Alkaline Phosphatase: 56 U/L (ref 38–126)
Anion gap: 11 (ref 5–15)
BUN: 10 mg/dL (ref 6–20)
CO2: 20 mmol/L — ABNORMAL LOW (ref 22–32)
Calcium: 9.3 mg/dL (ref 8.9–10.3)
Chloride: 106 mmol/L (ref 98–111)
Creatinine, Ser: 0.69 mg/dL (ref 0.44–1.00)
GFR calc Af Amer: >60 mL/min (ref >60)
GFR calc non Af Amer: >60 mL/min (ref >60)
Glucose, Bld: 107 mg/dL — ABNORMAL HIGH (ref 70–99)
Potassium: 3.7 mmol/L (ref 3.5–5.1)
Sodium: 137 mmol/L (ref 135–145)
Total Bilirubin: 1 mg/dL (ref 0.3–1.2)
Total Protein: 7.4 g/dL (ref 6.5–8.1)

## 2019-03-30 LAB — CBC
HCT: 50.5 % — ABNORMAL HIGH (ref 36.0–46.0)
Hemoglobin: 17.3 g/dL — ABNORMAL HIGH (ref 12.0–15.0)
MCH: 28 pg (ref 26.0–34.0)
MCHC: 34.3 g/dL (ref 30.0–36.0)
MCV: 81.7 fL (ref 80.0–100.0)
Platelets: 361 10*3/uL (ref 150–400)
RBC: 6.18 MIL/uL — ABNORMAL HIGH (ref 3.87–5.11)
RDW: 12.4 % (ref 11.5–15.5)
WBC: 14.4 10*3/uL — ABNORMAL HIGH (ref 4.0–10.5)
nRBC: 0 % (ref 0.0–0.2)

## 2019-03-30 LAB — URINALYSIS, ROUTINE W REFLEX MICROSCOPIC
Bilirubin Urine: NEGATIVE
Glucose, UA: NEGATIVE mg/dL
Hgb urine dipstick: NEGATIVE
Ketones, ur: 5 mg/dL — AB
Leukocytes,Ua: NEGATIVE
Nitrite: NEGATIVE
Protein, ur: NEGATIVE mg/dL
Specific Gravity, Urine: 1.029 (ref 1.005–1.030)
pH: 5 (ref 5.0–8.0)

## 2019-03-30 LAB — I-STAT BETA HCG BLOOD, ED (MC, WL, AP ONLY): I-stat hCG, quantitative: <5 m[IU]/mL (ref <5)

## 2019-03-30 LAB — LIPASE, BLOOD: Lipase: 29 U/L (ref 11–51)

## 2019-03-30 MED ORDER — SODIUM CHLORIDE (PF) 0.9 % IJ SOLN
INTRAMUSCULAR | Status: AC
  Filled 2019-03-30: qty 50

## 2019-03-30 MED ORDER — OXYCODONE-ACETAMINOPHEN 5-325 MG PO TABS: 1.0000 | ORAL_TABLET | ORAL | 0 refills | Status: AC | PRN

## 2019-03-30 MED ORDER — SENNOSIDES-DOCUSATE SODIUM 8.6-50 MG PO TABS: 1.0000 | ORAL_TABLET | Freq: Every day | ORAL | 0 refills | Status: AC

## 2019-03-30 MED ORDER — ONDANSETRON HCL 4 MG/2ML IJ SOLN
4.0000 mg | Freq: Once | INTRAMUSCULAR | Status: AC
  Administered 2019-03-30: 4 mg via INTRAVENOUS
  Filled 2019-03-30: qty 2

## 2019-03-30 MED ORDER — METRONIDAZOLE IN NACL 5-0.79 MG/ML-% IV SOLN
500.0000 mg | Freq: Once | INTRAVENOUS | Status: AC
  Administered 2019-03-30: 500 mg via INTRAVENOUS
  Filled 2019-03-30: qty 100

## 2019-03-30 MED ORDER — ONDANSETRON 4 MG PO TBDP
4.0000 mg | ORAL_TABLET | Freq: Once | ORAL | Status: AC
  Administered 2019-03-30: 4 mg via ORAL
  Filled 2019-03-30: qty 1

## 2019-03-30 MED ORDER — SODIUM CHLORIDE 0.9 % IV BOLUS
1000.0000 mL | Freq: Once | INTRAVENOUS | Status: AC
  Administered 2019-03-30: 1000 mL via INTRAVENOUS

## 2019-03-30 MED ORDER — MORPHINE SULFATE (PF) 4 MG/ML IV SOLN
4.0000 mg | Freq: Once | INTRAVENOUS | Status: AC
  Administered 2019-03-30: 4 mg via INTRAVENOUS
  Filled 2019-03-30: qty 1

## 2019-03-30 MED ORDER — METRONIDAZOLE 500 MG PO TABS: 500.0000 mg | ORAL_TABLET | Freq: Two times a day (BID) | ORAL | 0 refills | Status: AC

## 2019-03-30 MED ORDER — HYDROMORPHONE HCL 1 MG/ML IJ SOLN
1.0000 mg | Freq: Once | INTRAMUSCULAR | Status: AC
  Administered 2019-03-30: 1 mg via INTRAVENOUS
  Filled 2019-03-30: qty 1

## 2019-03-30 MED ORDER — SODIUM CHLORIDE 0.9 % IV SOLN
2.0000 g | Freq: Once | INTRAVENOUS | Status: AC
  Administered 2019-03-30: 2 g via INTRAVENOUS
  Filled 2019-03-30: qty 20

## 2019-03-30 MED ORDER — CEFPODOXIME PROXETIL 200 MG PO TABS: 400.0000 mg | ORAL_TABLET | Freq: Two times a day (BID) | ORAL | 0 refills | Status: AC

## 2019-03-30 MED ORDER — IOHEXOL 300 MG/ML  SOLN
100.0000 mL | Freq: Once | INTRAMUSCULAR | Status: AC | PRN
  Administered 2019-03-30: 17:00:00 100 mL via INTRAVENOUS

## 2019-03-30 NOTE — ED Notes (Signed)
Patient was verbalized discharge instructions. Pt had no further questions at this time. NAD. 

## 2019-03-30 NOTE — Discharge Instructions (Signed)
I have prescribed antibiotics to help treat your infection, please take these as prescribed.  There is also a prescription for pain medication, you will need to combine this with the fourth medication senna in order to prevent any constipation.  The number to Dr. Duanne Guess is attached to your chart, please schedule an appointment this week in order to see him.  If you experience any fever, worsening symptoms please return to the emergency department.

## 2019-03-30 NOTE — ED Triage Notes (Addendum)
Pt reports sharp lower abdominal pain that started last night. Pt states that N/V due to severe pain. Pt hx of appendectomy and cholecystectomy.

## 2019-03-30 NOTE — ED Provider Notes (Signed)
[facility]-EMERGENCY DEPT Provider Note   CSN: [id number] Arrival date & time: 03/30/19  1243     History Chief Complaint  Patient presents with  . Abdominal Pain    Abigail James is a 33 y.o. female.  33 y.o female with a PMH of DVT presents to the ED with a chief complaint of abdominal pain x last night. Patient describes a sharp pain along her bellybutton with radiation through her whole abdomen.  This pain is exacerbated with lying down flat and ambulating.  She also endorses 2 episodes of diarrhea last night without any blood.  She has had some nausea and an episode of vomiting which she attributes this to the pain.  She has taken some Pepto-Bismol x2 to help with her symptoms without improvement.  Patient does currently have a NuvaRing, has not had a menstrual period in several years.  Patient does have a prior history of an appendectomy and cholecystectomy along with a C-section.  She denies any fevers, urinary symptoms, the patient or sick contacts, or gyn complaints.   The history is provided by the patient.  Abdominal Pain Associated symptoms: nausea and vomiting   Associated symptoms: no chest pain, no fever, no shortness of breath and no sore throat        Past Medical History:  Diagnosis Date  . DVT (deep venous thrombosis) [facility])     Patient Active Problem List   Diagnosis Date Noted  . History of hyperthyroidism 10/03/2016  . Personal history of pulmonary embolism 10/03/2016  . 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 APPENDECTOMY    . CESAREAN SECTION N/A 12/19/2016   Procedure: CESAREAN SECTION;  Surgeon: Guernsey Bing, MD;  Location: [facility] BIRTHING SUITES;  Service: Obstetrics;  Laterality: N/A;  . CHOLECYSTECTOMY    . DILATION AND CURETTAGE OF UTERUS    . FOOT SURGERY    . LEG SURGERY    . TYMPANOSTOMY TUBE PLACEMENT       OB History    Gravida  4   Para  3   Term  2   Preterm  1   AB  1   Living  3     SAB  1   TAB  0   Ectopic  0   Multiple  0   Live Births  3           Family History  Problem Relation Age of Onset  . Hypertension Father   . Hypertension Brother   . Hypertension Maternal Uncle   . Cancer Maternal Grandmother   . Hypertension Paternal Grandmother   . Diabetes Paternal Grandmother   . Cancer Paternal Grandmother     Social History   Tobacco Use  . Smoking status: Former Smoker    Quit date: 04/08/2014    Years since quitting: 4.9  . Smokeless tobacco: Never Used  Substance Use Topics  . Alcohol use: Yes    Comment: occasional  . Drug use: No    Home Medications Prior to Admission medications   Medication Sig Start Date End Date Taking? Authorizing Provider  acetaminophen (TYLENOL) 325 MG tablet Take 650 mg by mouth every 6 (six) hours as needed for mild pain.   Yes [provider]  etonogestrel-ethinyl estradiol (NUVARING) 0.12-0.015 MG/24HR vaginal ring Insert vaginally and leave in place for 3 consecutive weeks, then remove for 1 week. 01/20/19  Yes Levie Heritage, DO  ibuprofen (ADVIL,MOTRIN) 200  MG tablet Take 200 mg by mouth every 6 (six) hours as needed.   Yes [provider]  omeprazole (PRILOSEC) 20 MG capsule Take 20 mg by mouth daily.   Yes [provider]  cefpodoxime (VANTIN) 200 MG tablet Take 2 tablets (400 mg total) by mouth 2 (two) times daily for 10 days. 03/30/19 04/09/19  Claude Manges, PA-C  guaiFENesin (MUCINEX) 600 MG 12 hr tablet Take by mouth 2 (two) times daily.    [provider]  metroNIDAZOLE (FLAGYL) 500 MG tablet Take 1 tablet (500 mg total) by mouth 2 (two) times daily for 10 days. 03/30/19 04/09/19  Claude Manges, PA-C  oxyCODONE-acetaminophen (PERCOCET/ROXICET) 5-325 MG tablet Take 1 tablet by mouth every 4 (four) hours as needed for up to 3 days for severe pain. 03/30/19 04/02/19  Claude Manges, PA-C  senna-docusate (SENOKOT-S) 8.6-50 MG tablet  Take 1 tablet by mouth daily for 7 days. 03/30/19 04/06/19  Claude Manges, PA-C    Allergies    Ciprofloxacin, Latex, Clindamycin/lincomycin, Vancomycin, Amoxicillin, Penicillins, and Sulfa antibiotics  Review of Systems   Review of Systems  Constitutional: Negative for fever.  HENT: Negative for sore throat.   Respiratory: Negative for shortness of breath.   Cardiovascular: Negative for chest pain.  Gastrointestinal: Positive for abdominal pain, nausea and vomiting.  Genitourinary: Negative for flank pain.  Musculoskeletal: Negative for back pain.  Skin: Negative for pallor and wound.  Neurological: Negative for light-headedness and headaches.    Physical Exam Updated Vital Signs BP 110/70 (BP Location: Right Arm)   Pulse 73   Temp 98.8 F (37.1 C) (Oral)   Resp 16   Ht  (1.651 m)   Wt 72.6 kg   SpO2 96%   BMI 26.63 kg/m   Physical Exam Vitals and nursing note reviewed.  Constitutional:      General: She is not in acute distress.    Appearance: She is well-developed.  HENT:     Head: Normocephalic and atraumatic.     Mouth/Throat:     Pharynx: No oropharyngeal exudate.  Eyes:     Pupils: Pupils are equal, round, and reactive to light.  Cardiovascular:     Rate and Rhythm: Regular rhythm.     Heart sounds: Normal heart sounds.  Pulmonary:     Effort: Pulmonary effort is normal. No respiratory distress.     Breath sounds: Normal breath sounds.  Abdominal:     General: Bowel sounds are decreased. There is no distension.     Palpations: Abdomen is soft.     Tenderness: There is abdominal tenderness in the periumbilical area. There is guarding. There is no right CVA tenderness, left CVA tenderness or rebound.     Hernia: No hernia is present.  Musculoskeletal:        General: No tenderness or deformity.     Cervical back: Normal range of motion.     Right lower leg: No edema.     Left lower leg: No edema.  Skin:    General: Skin is warm and dry.    Neurological:     Mental Status: She is alert and oriented to person, place, and time.     ED Results / Procedures / Treatments   Labs (all labs ordered are listed, but only abnormal results are displayed) Labs Reviewed  COMPREHENSIVE METABOLIC PANEL - Abnormal; Notable for the following components:      Result Value   CO2 20 (*)    Glucose, Bld 107 (*)  AST 14 (*)    All other components within normal limits  CBC - Abnormal; Notable for the following components:   WBC 14.4 (*)    RBC 6.18 (*)    Hemoglobin 17.3 (*)    HCT 50.5 (*)    All other components within normal limits  URINALYSIS, ROUTINE W REFLEX MICROSCOPIC - Abnormal; Notable for the following components:   Ketones, ur 5 (*)    All other components within normal limits  GI PATHOGEN PANEL BY PCR, STOOL  LIPASE, BLOOD  I-STAT BETA HCG BLOOD, ED ([facility], [facility], [facility] ONLY)    EKG None  Radiology CT ABDOMEN PELVIS W CONTRAST  Result Date: 03/30/2019 CLINICAL DATA:  33 year old female with history of right lower quadrant abdominal pain since yesterday evening. EXAM: CT ABDOMEN AND PELVIS WITH CONTRAST TECHNIQUE: Multidetector CT imaging of the abdomen and pelvis was performed using the standard protocol following bolus administration of intravenous contrast. CONTRAST:  124mL OMNIPAQUE IOHEXOL 300 MG/ML  SOLN COMPARISON:  No priors. FINDINGS: Lower chest: Unremarkable. Hepatobiliary: Several subcentimeter low-attenuation lesions are noted in the liver, too small to characterize, but statistically likely to represent tiny cysts. No other larger more suspicious cystic or solid hepatic lesions. No intra or extrahepatic biliary ductal dilatation. Status post cholecystectomy. Pancreas: No pancreatic mass. No pancreatic ductal dilatation. No pancreatic or peripancreatic fluid collections or inflammatory changes. Spleen: Unremarkable. Adrenals/Urinary Tract: Bilateral kidneys and adrenal glands are normal in appearance. No  hydroureteronephrosis. Urinary bladder is normal in appearance. Stomach/Bowel: Normal appearance of the stomach. No pathologic dilatation of small bowel or colon. Significant portions of the distal ileum and terminal ileum demonstrate mural thickening, luminal narrowing and surrounding inflammatory changes, compatible with ileitis. Status post appendectomy. Vascular/Lymphatic: No significant atherosclerotic disease, aneurysm or dissection noted in the abdominal or pelvic vasculature. Reproductive: Uterus and ovaries are unremarkable in appearance. Pessary or contraceptive ring in the vagina. Other: Small volume of ascites.  No pneumoperitoneum. Musculoskeletal: There are no aggressive appearing lytic or blastic lesions noted in the visualized portions of the skeleton. IMPRESSION: 1. Ileitis with small volume of ascites. Clinical correlation for signs and symptoms of Crohn's disease or infectious ileitis is recommended. 2. Additional incidental findings, as above. Electronically Signed   By: Vinnie Langton M.D.   On: 03/30/2019 17:30    Procedures Procedures (including critical care time)  Medications Ordered in ED Medications  sodium chloride (PF) 0.9 % injection (has no administration in time range)  sodium chloride 0.9 % bolus 1,000 mL (0 mLs Intravenous Stopped 03/30/19 2011)  morphine 4 MG/ML injection 4 mg (4 mg Intravenous Given 03/30/19 1559)  iohexol (OMNIPAQUE) 300 MG/ML solution 100 mL (100 mLs Intravenous Contrast Given 03/30/19 1720)  morphine 4 MG/ML injection 4 mg (4 mg Intravenous Given 03/30/19 1754)  ondansetron (ZOFRAN) injection 4 mg (4 mg Intravenous Given 03/30/19 1754)  cefTRIAXone (ROCEPHIN) 2 g in sodium chloride 0.9 % 100 mL IVPB (0 g Intravenous Stopped 03/30/19 2011)    And  metroNIDAZOLE (FLAGYL) IVPB 500 mg (0 mg Intravenous Stopped 03/30/19 2104)  sodium chloride 0.9 % bolus 1,000 mL (0 mLs Intravenous Stopped 03/30/19 2104)  HYDROmorphone (DILAUDID) injection 1 mg  (1 mg Intravenous Given 03/30/19 1959)    ED Course  I have reviewed the triage vital signs and the nursing notes.  Pertinent labs & imaging results that were available during my care of the patient were reviewed by me and considered in my medical decision making (see chart for details).  MDM Rules/Calculators/A&P  Patient with a past medical history of an appendectomy, cholecystectomy, C-section presents to the ED with complaints of pain along her bellybutton region, she reports the severe nature began last night.  She has had a couple episodes of diarrhea along with nausea and vomiting.  She has taken some Pepto-Bismol without any improvement in her symptoms.  She arrived in the ED tachycardic, hypertensive, satting at 98% on room air.  Denies any sick contacts or suspicious food intakes.  Her last menstrual period was several years ago she currently has a NuvaRing and does not remove this for menstrual cycles.  CBC remarkable for a white count of 14.4, this seems to be chronic for patient compared to her previous visits.  Her CMP did not show any electrolyte abnormality.  Creatinine level is within normal limits.  LFTs are unremarkable.  Lipase is within normal limits.  hCG is negative.  Urine without any signs of infection although ketones are present suspect likely due to dehydration.  Provided with a liter of fluids, morphine, Zofran to help with her symptoms.  CT abdomen and pelvis showed: 1. Ileitis with small volume of ascites. Clinical correlation for  signs and symptoms of Crohn's disease or infectious ileitis is  recommended.  2. Additional incidental findings, as above.       6:03 PM Spoke to GI on call Dr.Ambruster from [facility] who recommended patient receive an empiric treatment with Cipro Flagyl to treat her ileitis.  We will also need to submit a stool sample, will provide patient with antibiotics while in the ED and try to manage her pain, if improvement she will  likely follow-up with them on an outpatient basis.  6:41 PM Spoke to Pharmacist on call Kenard GowerDrew who recommended cefpodoxime 400 mg, as patient does have anaphylaxis reaction to ciprofloxacin. Will provide her with Flagyl along with ceftriaxone while in the ED. Patient was also given an extra dose of Dilaudid 1 mg to help with her pain. There has been no emesis episodes during her stay.  A stool sample has been ordered, however patient has been unable to use the restroom on today's visit.  10:38 PM patient reevaluated by me, does report improvement after Dilaudid, she is agreeable of outpatient treatment at this time.  I have provided her with some pain medication along with some senna to help prevent any constipation.  I have also forward this chart to Dr. Ellin SabaArmbuster will follow up with patient once appointment is scheduled.  Patient agreeable of treatment and plan, stable for discharge.   Portions of this note were generated with Scientist, clinical (histocompatibility and immunogenetics)Dragon dictation software. Dictation errors may occur despite best attempts at proofreading.  Final Clinical Impression(s) / ED Diagnoses Final diagnoses:  Ileitis  Periumbilical abdominal pain    Rx / DC Orders ED Discharge Orders         Ordered    metroNIDAZOLE (FLAGYL) 500 MG tablet  2 times daily     03/30/19 2235    cefpodoxime (VANTIN) 200 MG tablet  2 times daily     03/30/19 2235    oxyCODONE-acetaminophen (PERCOCET/ROXICET) 5-325 MG tablet  Every 4 hours PRN     03/30/19 2236    senna-docusate (SENOKOT-S) 8.6-50 MG tablet  Daily     03/30/19 2236           Claude MangesSoto, Garren Greenman, PA-C 03/30/19 2248    Alvira MondaySchlossman, Erin, MD 03/31/19 1345

## 2019-04-08 ENCOUNTER — Encounter: Payer: Self-pay | Admitting: *Deleted

## 2019-04-15 ENCOUNTER — Other Ambulatory Visit (INDEPENDENT_AMBULATORY_CARE_PROVIDER_SITE_OTHER): Payer: Medicaid Other

## 2019-04-15 ENCOUNTER — Ambulatory Visit: Payer: Medicaid Other | Admitting: Gastroenterology

## 2019-04-15 ENCOUNTER — Encounter: Payer: Self-pay | Admitting: Gastroenterology

## 2019-04-15 VITALS — BP 120/78 | HR 56 | Temp 98.7°F | Ht 65.0 in | Wt 158.2 lb

## 2019-04-15 DIAGNOSIS — R933 Abnormal findings on diagnostic imaging of other parts of digestive tract: Secondary | ICD-10-CM | POA: Insufficient documentation

## 2019-04-15 DIAGNOSIS — R1084 Generalized abdominal pain: Secondary | ICD-10-CM

## 2019-04-15 LAB — CBC WITH DIFFERENTIAL/PLATELET
Basophils Absolute: 0.1 10*3/uL (ref 0.0–0.1)
Basophils Relative: 0.8 % (ref 0.0–3.0)
Eosinophils Absolute: 0.6 10*3/uL (ref 0.0–0.7)
Eosinophils Relative: 7 % — ABNORMAL HIGH (ref 0.0–5.0)
HCT: 45.2 % (ref 36.0–46.0)
Hemoglobin: 15.4 g/dL — ABNORMAL HIGH (ref 12.0–15.0)
Lymphocytes Relative: 30.2 % (ref 12.0–46.0)
Lymphs Abs: 2.7 10*3/uL (ref 0.7–4.0)
MCHC: 34.1 g/dL (ref 30.0–36.0)
MCV: 83 fl (ref 78.0–100.0)
Monocytes Absolute: 0.8 10*3/uL (ref 0.1–1.0)
Monocytes Relative: 8.5 % (ref 3.0–12.0)
Neutro Abs: 4.8 10*3/uL (ref 1.4–7.7)
Neutrophils Relative %: 53.5 % (ref 43.0–77.0)
Platelets: 292 10*3/uL (ref 150.0–400.0)
RBC: 5.44 Mil/uL — ABNORMAL HIGH (ref 3.87–5.11)
RDW: 12.8 % (ref 11.5–15.5)
WBC: 9 10*3/uL (ref 4.0–10.5)

## 2019-04-15 LAB — SEDIMENTATION RATE: Sed Rate: 8 mm/hr (ref 0–20)

## 2019-04-15 LAB — HIGH SENSITIVITY CRP: CRP, High Sensitivity: 3.84 mg/L (ref 0.000–5.000)

## 2019-04-15 MED ORDER — HYOSCYAMINE SULFATE SL 0.125 MG SL SUBL: 1.0000 | SUBLINGUAL_TABLET | Freq: Three times a day (TID) | SUBLINGUAL | 1 refills | Status: DC | PRN

## 2019-04-15 NOTE — Progress Notes (Signed)
04/15/2019 Abigail James [id number] 10-05-1985   HISTORY OF PRESENT ILLNESS: This is a pleasant 34 year old female who is new to our office.  She is being seen here for a follow-up from her recent emergency room visit.  She went to the emergency room for complaints of abdominal pain and diarrhea.  She says that the night before her emergency room visit she had sudden onset severe lower abdominal pain.  This was accompanied by diarrhea.  She says that she vomited once, but she thinks it was more from the pain.  The next day she had not gone to the emergency department and had a CT scan of the abdomen and pelvis with contrast performed that showed ileitis with small volume ascites.  They did note significant portions of the distal ileum and terminal ileum demonstrate mural thickening, luminal narrowing, and surrounding inflammatory changes compatible with ileitis.  She was given some IV antibiotics in the emergency department and then sent home with a course of Flagyl and Cefpodoxime.  She is allergic to Cipro and penicillins.  While she was there she had an elevated white blood cell count at 14.4.  Hemoglobin was 17.3 g so likely represents some dehydration.  Otherwise labs looked good.  She says at this point she is about 75% improved.  Still has some abdominal discomfort.  She says that for some time her stomach has bothered her.  She says it definitely gets worse when she is stressed or anxious.  Reports generalized abdominal discomfort and bowel habits tends to alternate from constipation to normal stools to loose stools.  Has seen bright red blood on the toilet paper on rare occasion if she is constipated and straining.    Past Medical History:  Diagnosis Date  . Anxiety   . Depression   . DVT (deep venous thrombosis) ([place])   . Ileitis    Past Surgical History:  Procedure Laterality Date  . APPENDECTOMY    . CESAREAN SECTION N/A 12/19/2016   Procedure: CESAREAN SECTION;  Surgeon:  Aletha Halim, MD;  Location: [place];  Service: Obstetrics;  Laterality: N/A;  . CHOLECYSTECTOMY    . DILATION AND CURETTAGE OF UTERUS    . FOOT SURGERY    . LEG SURGERY    . TYMPANOSTOMY TUBE PLACEMENT      reports that she quit smoking about 5 years ago. She has never used smokeless tobacco. She reports previous alcohol use. She reports that she does not use drugs. family history includes Cancer in her maternal grandmother and paternal grandmother; Diabetes in her paternal grandmother; Hypertension in her brother, father, maternal uncle, and paternal grandmother. Allergies  Allergen Reactions  . Ciprofloxacin Anaphylaxis  . Latex Swelling  . Clindamycin/Lincomycin     ? Reaction, - dizzy, facial tingling, nausea, severe chest pain  . Vancomycin Nausea And Vomiting  . Amoxicillin Rash  . Penicillins Rash    Has patient had a PCN reaction causing immediate rash, facial/tongue/throat swelling, SOB or lightheadedness with hypotension: yes Has patient had a PCN reaction causing severe rash involving mucus membranes or skin necrosis:no Has patient had a PCN reaction that required hospitalization: unknown  Has patient had a PCN reaction occurring within the last 10 years: no If all of the above answers are "NO", then may proceed with Cephalosporin use.   . Sulfa Antibiotics Swelling and Rash      Outpatient Encounter Medications as of 04/15/2019  Medication Sig  . acetaminophen (TYLENOL) 325 MG tablet  Take 650 mg by mouth every 6 (six) hours as needed for mild pain.  Marland Kitchen etonogestrel-ethinyl estradiol (NUVARING) 0.12-0.015 MG/24HR vaginal ring Insert vaginally and leave in place for 3 consecutive weeks, then remove for 1 week.  Marland Kitchen ibuprofen (ADVIL,MOTRIN) 200 MG tablet Take 200 mg by mouth every 6 (six) hours as needed.  Marland Kitchen omeprazole (PRILOSEC) 20 MG capsule Take 20 mg by mouth daily.  . [DISCONTINUED] guaiFENesin (MUCINEX) 600 MG 12 hr tablet Take by mouth 2 (two) times  daily.   No facility-administered encounter medications on file as of 04/15/2019.     REVIEW OF SYSTEMS  : All other systems reviewed and negative except where noted in the History of Present Illness.   PHYSICAL EXAM: BP 120/78   Pulse (!) 56   Temp 98.7 F (37.1 C)   Ht 5\' 5"  (1.651 m)   Wt 158 lb 4 oz (71.8 kg)   BMI 26.33 kg/m  General: Well developed white female in no acute distress Head: Normocephalic and atraumatic Eyes:  Sclerae anicteric, conjunctiva pink. Ears: Normal auditory acuity Lungs: Clear throughout to auscultation; no increased WOB. Heart: Regular rate and rhythm; no M/R/G. Abdomen: Soft, non-distended.  BS present.  Mild diffuse TTP. Musculoskeletal: Symmetrical with no gross deformities  Skin: No lesions on visible extremities Extremities: No edema  Neurological: Alert oriented x 4, grossly non-focal Psychological:  Alert and cooperative. Normal mood and affect  ASSESSMENT AND PLAN: *34 year old female with sudden onset of abdominal pain and diarrhea.  CT scan that showed ileitis, question infectious versus Crohn's.  Was treated with antibiotics.  At this point she feels about 75% better.  The symptoms were very acute in onset, but she does admit to some alternating bowel issues and some abdominal discomfort for quite some time.  Aggravated by stress and anxiety.  I think that this acute issue may have been infectious, but due to her previous symptoms I do think that she warrants confirmation that the ileitis has resolved.  I think that she likely has some component of underlying IBS.  That being said, I am going to repeat a CBC today since she had an elevated white blood cell count in the ER.  We will check a sed rate and CRP as well.  Will order a CT enterography to be done in the next week or 2 to reevaluate the small bowel.  If inflammatory changes persist then she may need colonoscopy with ileoscopy to biopsy.  Otherwise I am going to send a prescription for  Levsin for her to use as needed for abdominal discomfort.   CC:  No ref. provider found

## 2019-04-15 NOTE — Progress Notes (Signed)
Agree with assessment with the following thoughts:  Glad to hear she is doing better. This may be infectious ileitis however studies looking at follow up colonoscopy in this scenario support and recommend a follow up colonoscopy to rule out Crohn's definitively as IBD can present like this. Please cancel her CT enterography and would schedule directly for colonoscopy in a few weeks with ileal intubation to further evaluate.  Darral Dash can you please let the patient know I reviewed her file, and recommend we cancel the enterography and pursue colonoscopy directly, if she is okay to proceed with this in a few weeks. Thanks

## 2019-04-15 NOTE — Patient Instructions (Addendum)
We have sent the following medications to your pharmacy for you to pick up at your convenience: Levsin 0.125 mg  You have been scheduled for a CT scan of the abdomen and pelvis at [facility] (1st floor)  You are scheduled on Friday, 04/22/19 at 1:30 pm. You should arrive at 12:00 pm for registration and preparation. Please follow the written instructions below on the day of your exam:  WARNING: IF YOU ARE ALLERGIC TO IODINE/X-RAY DYE, PLEASE NOTIFY RADIOLOGY IMMEDIATELY AT [phone]! YOU WILL BE GIVEN A 13 HOUR PREMEDICATION PREP.  1) Do not eat or drink anything after 9:30 am (4 hours prior to your test)  This test typically takes 30-45 minutes to complete.   If you have any questions regarding your exam or if you need to reschedule, you may call the CT department at [phone] between the hours of 8:00 am and 5:00 pm, Monday-Friday. ______________________________________________________  Your provider has requested that you go to the basement level for lab work before leaving today. Press "B" on the elevator. The lab is located at the first door on the left as you exit the elevator.  If you are age 66 or older, your body mass index should be between 23-30. Your Body mass index is 26.33 kg/m. If this is out of the aforementioned range listed, please consider follow up with your Primary Care Provider.  If you are age 71 or younger, your body mass index should be between 19-25. Your Body mass index is 26.33 kg/m. If this is out of the aformentioned range listed, please consider follow up with your Primary Care Provider.   Due to recent changes in healthcare laws, you may see the results of your imaging and laboratory studies on MyChart before your provider has had a chance to review them.  We understand that in some cases there may be results that are confusing or concerning to you. Not all laboratory results come back in the same time frame and the provider may be waiting for  multiple results in order to interpret others.  Please give [place] 48 hours in order for your provider to thoroughly review all the results before contacting the office for clarification of your results.

## 2019-04-18 ENCOUNTER — Encounter: Payer: Self-pay | Admitting: Gastroenterology

## 2019-04-18 ENCOUNTER — Telehealth: Payer: Self-pay

## 2019-04-18 ENCOUNTER — Telehealth: Payer: Self-pay | Admitting: Gastroenterology

## 2019-04-18 NOTE — Telephone Encounter (Signed)
Cancelled CT enterography and scheduled colonoscopy on 04/29/19 at 10:00am. Pre-visit on 04/25/19 at 10:00am. COVID testing on 04/27/19 at 1:00pm @ [id number]

## 2019-04-18 NOTE — Telephone Encounter (Signed)
Zehr, Princella Pellegrini, PA-C  Loretha Stapler, RN  Please let her know that her inflammatory markers are normal. Her white blood cell count has returned to normal as well, which is good news. Hemoglobin still very slightly high, but improved from previous. Nothing for concern at this point. Will await results of CT enterography. Pt aware

## 2019-04-19 ENCOUNTER — Ambulatory Visit: Payer: Medicaid Other | Admitting: Nurse Practitioner

## 2019-04-22 ENCOUNTER — Other Ambulatory Visit (HOSPITAL_COMMUNITY): Payer: Medicaid Other

## 2019-04-25 ENCOUNTER — Other Ambulatory Visit: Payer: Self-pay

## 2019-04-25 ENCOUNTER — Ambulatory Visit (AMBULATORY_SURGERY_CENTER): Payer: Self-pay | Admitting: *Deleted

## 2019-04-25 VITALS — Temp 98.0°F | Ht 65.0 in | Wt 159.0 lb

## 2019-04-25 DIAGNOSIS — Z01818 Encounter for other preprocedural examination: Secondary | ICD-10-CM

## 2019-04-25 DIAGNOSIS — R1084 Generalized abdominal pain: Secondary | ICD-10-CM

## 2019-04-25 DIAGNOSIS — R933 Abnormal findings on diagnostic imaging of other parts of digestive tract: Secondary | ICD-10-CM

## 2019-04-25 MED ORDER — NA SULFATE-K SULFATE-MG SULF 17.5-3.13-1.6 GM/177ML PO SOLN: ORAL | 0 refills | Status: DC

## 2019-04-25 NOTE — Progress Notes (Signed)
Patient is here in-person for PV. Patient denies any allergies to eggs or soy. Patient denies any problems with anesthesia/sedation. Patient denies any oxygen use at home. Patient denies taking any diet/weight loss medications or blood thinners. Patient is not being treated for MRSA or C-diff. EMMI education assisgned to the patient for the procedure, this was explained and instructions given to patient. COVID-19 screening test is on 1/27, the pt is aware. Pt is aware that care partner will wait in the car during procedure; if they feel like they will be too hot or cold to wait in the car; they may wait in the 4 th floor lobby. Patient is aware to bring only one care partner. We want them to wear a mask (we do not have any that we can provide them), practice social distancing, and we will check their temperatures when they get here.  I did remind the patient that their care partner needs to stay in the parking lot the entire time and have a cell phone available, we will call them when the pt is ready for discharge. Patient will wear mask into building.

## 2019-04-27 ENCOUNTER — Ambulatory Visit (INDEPENDENT_AMBULATORY_CARE_PROVIDER_SITE_OTHER): Payer: Medicaid Other

## 2019-04-27 DIAGNOSIS — Z1159 Encounter for screening for other viral diseases: Secondary | ICD-10-CM

## 2019-04-27 LAB — SARS CORONAVIRUS 2 (TAT 6-24 HRS): SARS Coronavirus 2: NEGATIVE

## 2019-04-29 ENCOUNTER — Other Ambulatory Visit: Payer: Self-pay

## 2019-04-29 ENCOUNTER — Ambulatory Visit (AMBULATORY_SURGERY_CENTER): Payer: Medicaid Other | Admitting: Gastroenterology

## 2019-04-29 ENCOUNTER — Encounter: Payer: Self-pay | Admitting: Gastroenterology

## 2019-04-29 VITALS — BP 110/66 | HR 205 | Temp 97.3°F | Resp 17 | Ht 65.0 in | Wt 159.0 lb

## 2019-04-29 DIAGNOSIS — R933 Abnormal findings on diagnostic imaging of other parts of digestive tract: Secondary | ICD-10-CM

## 2019-04-29 DIAGNOSIS — R1084 Generalized abdominal pain: Secondary | ICD-10-CM | POA: Diagnosis not present

## 2019-04-29 DIAGNOSIS — K648 Other hemorrhoids: Secondary | ICD-10-CM | POA: Diagnosis not present

## 2019-04-29 MED ORDER — SODIUM CHLORIDE 0.9 % IV SOLN: 500.0000 mL | INTRAVENOUS | Status: DC

## 2019-04-29 NOTE — Op Note (Signed)
[facility] Patient Name: Abigail James Procedure Date: 04/29/2019 9:50 AM MRN: [id number] Endoscopist: Viviann Spare P. Adela Lank , MD Age: 34 Referring MD:  Date of Birth: 18-May-1985 Gender: Female Account #: [id number] Procedure:                Colonoscopy Indications:              Abnormal CT of the GI tract showing ileitis in                            December, associated with severe abdominal pain,                            treated with antibiotics with interval improvement                            but not yet completely resolved Medicines:                Monitored Anesthesia Care Procedure:                Pre-Anesthesia Assessment:                           - Prior to the procedure, a History and Physical                            was performed, and patient medications and                            allergies were reviewed. The patient's tolerance of                            previous anesthesia was also reviewed. The risks                            and benefits of the procedure and the sedation                            options and risks were discussed with the patient.                            All questions were answered, and informed consent                            was obtained. Prior Anticoagulants: The patient has                            taken no previous anticoagulant or antiplatelet                            agents. ASA Grade Assessment: II - A patient with                            mild systemic disease. After reviewing the risks  and benefits, the patient was deemed in                            satisfactory condition to undergo the procedure.                           After obtaining informed consent, the colonoscope                            was passed under direct vision. Throughout the                            procedure, the patient's blood pressure, pulse, and                            oxygen saturations were  monitored continuously. The                            Colonoscope was introduced through the anus and                            advanced to the the terminal ileum, with                            identification of the appendiceal orifice and IC                            valve. The colonoscopy was performed without                            difficulty. The patient tolerated the procedure                            well. The quality of the bowel preparation was                            good. The terminal ileum, ileocecal valve,                            appendiceal orifice, and rectum were photographed. Scope In: 10:00:55 AM Scope Out: 10:21:21 AM Scope Withdrawal Time: 0 hours 15 minutes 44 seconds  Total Procedure Duration: 0 hours 20 minutes 26 seconds  Findings:                 The perianal and digital rectal examinations were                            normal.                           The terminal ileum appeared normal. No inflammatory                            changes, no evidence of Crohn's disease.  Internal hemorrhoids were found during                            retroflexion. The hemorrhoids were small.                           The exam was otherwise without abnormality. Complications:            No immediate complications. Estimated blood loss:                            None. Estimated Blood Loss:     Estimated blood loss: none. Impression:               - The examined portion of the ileum was normal.                           - Internal hemorrhoids.                           - The examination was otherwise normal.                           - No specimens collected.                           Overall, normal exam. Suspect the patient had                            infectious ileitis leading to prior abdominal pain                            / CT abnormalities, no evidence of Crohn's disease                            on this  exam Recommendation:           - Patient has a contact number available for                            emergencies. The signs and symptoms of potential                            delayed complications were discussed with the                            patient. Return to normal activities tomorrow.                            Written discharge instructions were provided to the                            patient.                           - Resume previous diet.                           -  Continue present medications.                           - Consideration for trial of Bentyl for                            intermittent cramps / pain if symptoms persist Abigail James P. Adela Lank, MD 04/29/2019 10:26:54 AM This report has been signed electronically.

## 2019-04-29 NOTE — Patient Instructions (Signed)
Handout given for hemorrhoids.  YOU HAD AN ENDOSCOPIC PROCEDURE TODAY AT THE [facility]:   Refer to the procedure report that was given to you for any specific questions about what was found during the examination.  If the procedure report does not answer your questions, please call your gastroenterologist to clarify.  If you requested that your care partner not be given the details of your procedure findings, then the procedure report has been included in a sealed envelope for you to review at your convenience later.  YOU SHOULD EXPECT: Some feelings of bloating in the abdomen. Passage of more gas than usual.  Walking can help get rid of the air that was put into your GI tract during the procedure and reduce the bloating. If you had a lower endoscopy (such as a colonoscopy or flexible sigmoidoscopy) you may notice spotting of blood in your stool or on the toilet paper. If you underwent a bowel prep for your procedure, you may not have a normal bowel movement for a few days.  Please Note:  You might notice some irritation and congestion in your nose or some drainage.  This is from the oxygen used during your procedure.  There is no need for concern and it should clear up in a day or so.  SYMPTOMS TO REPORT IMMEDIATELY:   Following lower endoscopy (colonoscopy or flexible sigmoidoscopy):  Excessive amounts of blood in the stool  Significant tenderness or worsening of abdominal pains  Swelling of the abdomen that is new, acute  Fever of 100F or higher   For urgent or emergent issues, a gastroenterologist can be reached at any hour by calling [phone].   DIET:  We do recommend a small meal at first, but then you may proceed to your regular diet.  Drink plenty of fluids but you should avoid alcoholic beverages for 24 hours.  ACTIVITY:  You should plan to take it easy for the rest of today and you should NOT DRIVE or use heavy machinery until tomorrow (because of the sedation  medicines used during the test).    FOLLOW UP: Our staff will call the number listed on your records 48-72 hours following your procedure to check on you and address any questions or concerns that you may have regarding the information given to you following your procedure. If we do not reach you, we will leave a message.  We will attempt to reach you two times.  During this call, we will ask if you have developed any symptoms of COVID 19. If you develop any symptoms (ie: fever, flu-like symptoms, shortness of breath, cough etc.) before then, please call [phone].  If you test positive for Covid 19 in the 2 weeks post procedure, please call and report this information to us.    If any biopsies were taken you will be contacted by phone or by letter within the next 1-3 weeks.  Please call us at [phone] if you have not heard about the biopsies in 3 weeks.    SIGNATURES/CONFIDENTIALITY: You and/or your care partner have signed paperwork which will be entered into your electronic medical record.  These signatures attest to the fact that that the information above on your After Visit Summary has been reviewed and is understood.  Full responsibility of the confidentiality of this discharge information lies with you and/or your care-partner. 

## 2019-04-29 NOTE — Progress Notes (Signed)
Report to PACU, RN, vss, BBS= Clear.  

## 2019-04-29 NOTE — Progress Notes (Signed)
Temperature- Lisa Clapss VS- Shannon  Pt's states no medical or surgical changes since previsit or office visit.

## 2019-05-03 ENCOUNTER — Telehealth: Payer: Self-pay

## 2019-05-03 NOTE — Telephone Encounter (Signed)
  Follow up Call-  Call back number 04/29/2019  Post procedure Call Back phone  # [phone]  Permission to leave phone message Yes  Some recent data might be hidden     Patient questions:  Do you have a fever, pain , or abdominal swelling? No. Pain Score  0 *  Have you tolerated food without any problems? Yes.    Have you been able to return to your normal activities? Yes.    Do you have any questions about your discharge instructions: Diet   No. Medications  No. Follow up visit  No.  Do you have questions or concerns about your Care? No.  Actions: * If pain score is 4 or above: No action needed, pain <4.  1. Have you developed a fever since your procedure? no  2.   Have you had an respiratory symptoms (SOB or cough) since your procedure? no  3.   Have you tested positive for COVID 19 since your procedure no  4.   Have you had any family members/close contacts diagnosed with the COVID 19 since your procedure?  no   If yes to any of these questions please route to Laverna Peace, RN and Jennye Boroughs, Charity fundraiser.

## 2019-06-21 ENCOUNTER — Telehealth: Payer: Self-pay

## 2019-06-21 NOTE — Telephone Encounter (Signed)
Patient called stating that her child threw away her nuva ring box. Today is the day to switch out to a new on and the pharmacy will not let her refill because she just got a three month supply.  Patient instructed to come to the office and pick up a nuva ring sample. Armandina Stammer RN

## 2020-01-30 ENCOUNTER — Other Ambulatory Visit: Payer: Self-pay | Admitting: Family Medicine

## 2020-02-01 ENCOUNTER — Other Ambulatory Visit: Payer: Self-pay

## 2020-02-01 MED ORDER — ETONOGESTREL-ETHINYL ESTRADIOL 0.12-0.015 MG/24HR VA RING: VAGINAL_RING | VAGINAL | 3 refills | Status: DC

## 2020-03-08 ENCOUNTER — Ambulatory Visit: Payer: Medicaid Other | Admitting: Gastroenterology

## 2020-06-29 ENCOUNTER — Other Ambulatory Visit: Payer: Self-pay

## 2020-06-29 ENCOUNTER — Encounter (HOSPITAL_COMMUNITY): Payer: Self-pay

## 2020-06-29 ENCOUNTER — Emergency Department (HOSPITAL_COMMUNITY): Payer: Medicaid Other

## 2020-06-29 ENCOUNTER — Emergency Department (HOSPITAL_COMMUNITY)
Admission: EM | Admit: 2020-06-29 | Discharge: 2020-06-30 | Disposition: A | Discharge status: Home / Self Care | Payer: Medicaid Other | Attending: Emergency Medicine | Admitting: Emergency Medicine

## 2020-06-29 DIAGNOSIS — R0602 Shortness of breath: Secondary | ICD-10-CM

## 2020-06-29 DIAGNOSIS — I10 Essential (primary) hypertension: Secondary | ICD-10-CM | POA: Diagnosis not present

## 2020-06-29 DIAGNOSIS — Z5321 Procedure and treatment not carried out due to patient leaving prior to being seen by health care provider: Secondary | ICD-10-CM | POA: Insufficient documentation

## 2020-06-29 DIAGNOSIS — R42 Dizziness and giddiness: Secondary | ICD-10-CM | POA: Diagnosis present

## 2020-06-29 LAB — I-STAT BETA HCG BLOOD, ED (MC, WL, AP ONLY): I-stat hCG, quantitative: <5 m[IU]/mL (ref <5)

## 2020-06-29 LAB — COMPREHENSIVE METABOLIC PANEL
ALT: 15 U/L (ref 0–44)
AST: 18 U/L (ref 15–41)
Albumin: 3.9 g/dL (ref 3.5–5.0)
Alkaline Phosphatase: 60 U/L (ref 38–126)
Anion gap: 8 (ref 5–15)
BUN: <5 mg/dL — ABNORMAL LOW (ref 6–20)
CO2: 23 mmol/L (ref 22–32)
Calcium: 8.9 mg/dL (ref 8.9–10.3)
Chloride: 108 mmol/L (ref 98–111)
Creatinine, Ser: 0.74 mg/dL (ref 0.44–1.00)
GFR, Estimated: >60 mL/min (ref >60)
Glucose, Bld: 94 mg/dL (ref 70–99)
Potassium: 2.9 mmol/L — ABNORMAL LOW (ref 3.5–5.1)
Sodium: 139 mmol/L (ref 135–145)
Total Bilirubin: 0.8 mg/dL (ref 0.3–1.2)
Total Protein: 7 g/dL (ref 6.5–8.1)

## 2020-06-29 LAB — CBC WITH DIFFERENTIAL/PLATELET
Abs Immature Granulocytes: 0.02 10*3/uL (ref 0.00–0.07)
Basophils Absolute: 0 10*3/uL (ref 0.0–0.1)
Basophils Relative: 1 %
Eosinophils Absolute: 0.2 10*3/uL (ref 0.0–0.5)
Eosinophils Relative: 3 %
HCT: 45.9 % (ref 36.0–46.0)
Hemoglobin: 15.9 g/dL — ABNORMAL HIGH (ref 12.0–15.0)
Immature Granulocytes: 0 %
Lymphocytes Relative: 31 %
Lymphs Abs: 2.4 10*3/uL (ref 0.7–4.0)
MCH: 28.3 pg (ref 26.0–34.0)
MCHC: 34.6 g/dL (ref 30.0–36.0)
MCV: 81.8 fL (ref 80.0–100.0)
Monocytes Absolute: 0.6 10*3/uL (ref 0.1–1.0)
Monocytes Relative: 8 %
Neutro Abs: 4.5 10*3/uL (ref 1.7–7.7)
Neutrophils Relative %: 57 %
Platelets: 300 10*3/uL (ref 150–400)
RBC: 5.61 MIL/uL — ABNORMAL HIGH (ref 3.87–5.11)
RDW: 12.2 % (ref 11.5–15.5)
WBC: 7.8 10*3/uL (ref 4.0–10.5)
nRBC: 0 % (ref 0.0–0.2)

## 2020-06-29 LAB — TROPONIN I (HIGH SENSITIVITY): Troponin I (High Sensitivity): 3 ng/L (ref <18)

## 2020-06-29 LAB — LIPASE, BLOOD: Lipase: 35 U/L (ref 11–51)

## 2020-06-29 NOTE — ED Triage Notes (Signed)
PT at PCP for anxiety medication, noticed hypertension and EKG changes. Sent in to ER for evaluation.

## 2020-06-29 NOTE — ED Provider Notes (Cosign Needed)
MSE was initiated and I personally evaluated the patient and placed orders (if any) at  12:39 PM on June 29, 2020.  The patient appears stable so that the remainder of the MSE may be completed by another provider.  Patient went to her PCP for anxiety medication who noticed hypertension and ?EKG changes.   Patient reports feeling anxious   When she stands she feels light headed.    She reports chest pain stated a week ago.   She has not been to the doctor for two years.    Patient counseled on the need to stay for full evaluation.        Cristina Gong, [place] 06/29/20 1244

## 2020-06-29 NOTE — ED Notes (Signed)
Pt requested to recheck blood pressure. Patient stated she would leave if her blood pressure improved from her initial BP and did not want to continue waiting. Pt has left the ED at this time.

## 2020-07-20 ENCOUNTER — Emergency Department (HOSPITAL_COMMUNITY): Payer: Medicaid Other

## 2020-07-20 ENCOUNTER — Emergency Department (HOSPITAL_COMMUNITY)
Admission: EM | Admit: 2020-07-20 | Discharge: 2020-07-20 | Disposition: A | Discharge status: Home / Self Care | Payer: Medicaid Other | Attending: Emergency Medicine | Admitting: Emergency Medicine

## 2020-07-20 ENCOUNTER — Other Ambulatory Visit: Payer: Self-pay

## 2020-07-20 ENCOUNTER — Encounter (HOSPITAL_COMMUNITY): Payer: Self-pay

## 2020-07-20 DIAGNOSIS — Z87891 Personal history of nicotine dependence: Secondary | ICD-10-CM | POA: Diagnosis not present

## 2020-07-20 DIAGNOSIS — Z86718 Personal history of other venous thrombosis and embolism: Secondary | ICD-10-CM | POA: Diagnosis not present

## 2020-07-20 DIAGNOSIS — Z7982 Long term (current) use of aspirin: Secondary | ICD-10-CM | POA: Diagnosis not present

## 2020-07-20 DIAGNOSIS — Z91040 Latex allergy status: Secondary | ICD-10-CM | POA: Diagnosis not present

## 2020-07-20 DIAGNOSIS — R0602 Shortness of breath: Secondary | ICD-10-CM | POA: Diagnosis not present

## 2020-07-20 DIAGNOSIS — J45909 Unspecified asthma, uncomplicated: Secondary | ICD-10-CM | POA: Diagnosis not present

## 2020-07-20 DIAGNOSIS — R079 Chest pain, unspecified: Secondary | ICD-10-CM | POA: Insufficient documentation

## 2020-07-20 LAB — CBC
HCT: 46.7 % — ABNORMAL HIGH (ref 36.0–46.0)
Hemoglobin: 16.2 g/dL — ABNORMAL HIGH (ref 12.0–15.0)
MCH: 28.4 pg (ref 26.0–34.0)
MCHC: 34.7 g/dL (ref 30.0–36.0)
MCV: 81.9 fL (ref 80.0–100.0)
Platelets: 281 10*3/uL (ref 150–400)
RBC: 5.7 MIL/uL — ABNORMAL HIGH (ref 3.87–5.11)
RDW: 12.2 % (ref 11.5–15.5)
WBC: 6.4 10*3/uL (ref 4.0–10.5)
nRBC: 0 % (ref 0.0–0.2)

## 2020-07-20 LAB — BASIC METABOLIC PANEL
Anion gap: 9 (ref 5–15)
BUN: 5 mg/dL — ABNORMAL LOW (ref 6–20)
CO2: 21 mmol/L — ABNORMAL LOW (ref 22–32)
Calcium: 9.1 mg/dL (ref 8.9–10.3)
Chloride: 107 mmol/L (ref 98–111)
Creatinine, Ser: 0.73 mg/dL (ref 0.44–1.00)
GFR, Estimated: >60 mL/min (ref >60)
Glucose, Bld: 114 mg/dL — ABNORMAL HIGH (ref 70–99)
Potassium: 3 mmol/L — ABNORMAL LOW (ref 3.5–5.1)
Sodium: 137 mmol/L (ref 135–145)

## 2020-07-20 LAB — TROPONIN I (HIGH SENSITIVITY): Troponin I (High Sensitivity): <2 ng/L (ref <18)

## 2020-07-20 LAB — I-STAT BETA HCG BLOOD, ED (MC, WL, AP ONLY): I-stat hCG, quantitative: <5 m[IU]/mL (ref <5)

## 2020-07-20 MED ORDER — IOHEXOL 350 MG/ML SOLN
75.0000 mL | Freq: Once | INTRAVENOUS | Status: AC
  Administered 2020-07-20: 65 mL via INTRAVENOUS

## 2020-07-20 NOTE — ED Notes (Signed)
Walked patient to the bathroom patient did well 

## 2020-07-20 NOTE — Discharge Instructions (Addendum)
Your work-up today was overall reassuring.  There was no evidence of blood clot in your lungs on CT scan.  Please make sure to follow-up with your primary care doctor and your cardiologist.  Return to the ER for any new or worsening symptoms

## 2020-07-20 NOTE — ED Triage Notes (Signed)
Pt reports chest pain for the past few weeks, saw her cardiologist as a follow up and was told her d dimer was elevated, sent here to rule out a PE. Pt also reports intermittent SOB. Pt a.o, resp e.u at this time.

## 2020-07-20 NOTE — ED Notes (Signed)
Got patient on the monitor patient is resting with call bell in reach  ?

## 2020-07-20 NOTE — ED Provider Notes (Signed)
[facility] EMERGENCY DEPARTMENT Provider Note   CSN: [id number] Arrival date & time: 07/20/20  0840     History Chief Complaint  Patient presents with  . Chest Pain    Abigail James is a 35 y.o. female.  HPI 35 year old female with a history of asthma, depression, DVT, cryofibrinogenemia presents to the ER with complaints of chest pain.  Patient states that she has been in the process of being worked up by her cardiologist, and is scheduled for an echo next week.  She had some basic blood work done, including a D-dimer which was elevated in the office.  She was sent here for CT scan of the chest.  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  She has been having ongoing chest pain, sometimes with exertion and some shortness of breath since April 1.  Denies any fevers, chills.  She is not on any supplemental estrogen, no recent travel.  Noticeable leg swelling    Past Medical History:  Diagnosis Date  . Anxiety   . Asthma   . Depression   . DVT (deep venous thrombosis) (HCC)   . Ileitis     Patient Active Problem List   Diagnosis Date Noted  . Abnormal CT scan, small bowel 04/15/2019  . Generalized abdominal pain 04/15/2019  . History of hyperthyroidism 10/03/2016  . Personal history of pulmonary embolism 10/03/2016  . 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 APPENDECTOMY    . CESAREAN SECTION N/A 12/19/2016   Procedure: CESAREAN SECTION;  Surgeon: Sumner Bing, MD;  Location: [facility] BIRTHING SUITES;  Service: Obstetrics;  Laterality: N/A;  . CHOLECYSTECTOMY    . DILATION AND CURETTAGE OF UTERUS    . FOOT SURGERY    . LEG SURGERY    . TYMPANOSTOMY TUBE PLACEMENT       OB History    Gravida  4   Para  3   Term  2   Preterm   1   AB  1   Living  3     SAB  1   IAB  0   Ectopic  0   Multiple  0   Live Births  3           Family History  Problem Relation Age of Onset  . Hypertension Father   . Hypertension Brother   . Hypertension Maternal Uncle   . Cancer Maternal Grandmother   . Hypertension Paternal Grandmother   . Diabetes Paternal Grandmother   . Cancer Paternal Grandmother   . Colon cancer Neg Hx   . Colon polyps Neg Hx   . Esophageal cancer Neg Hx   . Rectal cancer Neg Hx   . Stomach cancer Neg Hx     Social History   Tobacco Use  . Smoking status: Former Smoker    Quit date: 04/08/2014    Years since quitting: 6.2  . Smokeless tobacco: Never Used  Vaping Use  . Vaping Use: Never used  Substance Use Topics  . Alcohol use: Not Currently    Comment: occasional  . Drug use: No    Home Medications Prior to Admission medications   Medication Sig Start Date End Date Taking? Authorizing Provider  acetaminophen (TYLENOL) 325 MG tablet Take  650 mg by mouth every 6 (six) hours as needed for mild pain.   Yes [provider]  ASPIRIN LOW DOSE 81 MG EC tablet Take 81 mg by mouth every morning. 07/02/20  Yes [provider]  busPIRone (BUSPAR) 10 MG tablet Take 10 mg by mouth 2 (two) times daily as needed for anxiety. 06/29/20  Yes [provider]  etonogestrel-ethinyl estradiol (NUVARING) 0.12-0.015 MG/24HR vaginal ring Insert vaginally and leave in place for 3 consecutive weeks, then remove for 1 week. 02/01/20  Yes Levie Heritage, DO  ibuprofen (ADVIL,MOTRIN) 200 MG tablet Take 200 mg by mouth every 6 (six) hours as needed for headache or mild pain.   Yes [provider]  Multiple Vitamins-Minerals (MULTIVITAMIN WOMEN PO) Take 1 tablet by mouth daily.   Yes [provider]  omeprazole (PRILOSEC) 20 MG capsule Take 20 mg by mouth daily.   Yes [provider]  Hyoscyamine Sulfate SL (LEVSIN/SL) 0.125 MG SUBL Place 1 tablet under the  tongue every 8 (eight) hours as needed. Patient not taking: No sig reported 04/15/19   Zehr, Princella Pellegrini, PA-C    Allergies    Ciprofloxacin, Isosorbide, Latex, Clindamycin/lincomycin, Vancomycin, Amoxicillin, Penicillins, and Sulfa antibiotics  Review of Systems   Review of Systems  Constitutional: Negative for chills and fever.  HENT: Negative for ear pain and sore throat.   Eyes: Negative for pain and visual disturbance.  Respiratory: Positive for shortness of breath. Negative for cough.   Cardiovascular: Positive for chest pain. Negative for palpitations.  Gastrointestinal: Negative for abdominal pain and vomiting.  Genitourinary: Negative for dysuria and hematuria.  Musculoskeletal: Negative for arthralgias and back pain.  Skin: Negative for color change and rash.  Neurological: Negative for seizures and syncope.  All other systems reviewed and are negative.   Physical Exam Updated Vital Signs BP 138/79 (BP Location: Right Arm)   Pulse 83   Temp 97.9 F (36.6 C) (Oral)   Resp 18   SpO2 100%   Physical Exam Vitals and nursing note reviewed.  Constitutional:      General: She is not in acute distress.    Appearance: She is well-developed.  HENT:     Head: Normocephalic and atraumatic.  Eyes:     Conjunctiva/sclera: Conjunctivae normal.  Cardiovascular:     Rate and Rhythm: Normal rate and regular rhythm.     Heart sounds: Normal heart sounds. No murmur heard.   Pulmonary:     Effort: Pulmonary effort is normal. No respiratory distress.     Breath sounds: Normal breath sounds. No decreased breath sounds.  Chest:     Chest wall: No tenderness.  Abdominal:     Palpations: Abdomen is soft.     Tenderness: There is no abdominal tenderness.  Musculoskeletal:     Cervical back: Neck supple.     Right lower leg: No edema.     Left lower leg: No edema.  Skin:    General: Skin is warm and dry.  Neurological:     General: No focal deficit present.     Mental Status:  She is alert.  Psychiatric:        Mood and Affect: Mood normal.        Behavior: Behavior normal.     ED Results / Procedures / Treatments   Labs (all labs ordered are listed, but only abnormal results are displayed) Labs Reviewed  BASIC METABOLIC PANEL - Abnormal; Notable for the following components:  Result Value   Potassium 3.0 (*)    CO2 21 (*)    Glucose, Bld 114 (*)    BUN 5 (*)    All other components within normal limits  CBC - Abnormal; Notable for the following components:   RBC 5.70 (*)    Hemoglobin 16.2 (*)    HCT 46.7 (*)    All other components within normal limits  I-STAT BETA HCG BLOOD, ED ([facility], [facility], [facility] ONLY)  TROPONIN I (HIGH SENSITIVITY)  TROPONIN I (HIGH SENSITIVITY)    EKG EKG Interpretation  Date/Time:  Friday July 20 2020 08:50:18 EDT Ventricular Rate:  84 PR Interval:  144 QRS Duration: 74 QT Interval:  352 QTC Calculation: 415 R Axis:   82 Text Interpretation: Normal sinus rhythm with sinus arrhythmia Normal ECG since last tracing no significant change Confirmed by Mancel BaleWentz, Elliott [phone]([id number]) on 07/20/2020 9:17:32 AM   Radiology DG Chest 2 View  Result Date: 07/20/2020 CLINICAL DATA:  Shortness of breath and weakness. EXAM: CHEST - 2 VIEW COMPARISON:  06/29/2020 FINDINGS: The cardiac silhouette, mediastinal and hilar contours are normal. The lungs are clear. The bony thorax is intact. IMPRESSION: Normal chest x-ray. Electronically Signed   By: Rudie MeyerP.  Gallerani M.D.   On: 07/20/2020 09:36   CT Angio Chest PE W and/or Wo Contrast  Result Date: 07/20/2020 CLINICAL DATA:  Upper chest heaviness, abnormal EKG EXAM: CT ANGIOGRAPHY CHEST WITH CONTRAST TECHNIQUE: Multidetector CT imaging of the chest was performed using the standard protocol during bolus administration of intravenous contrast. Multiplanar CT image reconstructions and MIPs were obtained to evaluate the vascular anatomy. CONTRAST:  65mL OMNIPAQUE IOHEXOL 350 MG/ML SOLN COMPARISON:  07/20/2020  chest x-ray FINDINGS: Cardiovascular: Pulmonary arteries are well visualized and appear patent. No significant filling defect or pulmonary embolus by CTA. Pulmonary arteries are normal in caliber. Intact thoracic aorta. Patent 3 vessel arch anatomy. No aneurysm or dissection. No mediastinal hemorrhage or hematoma. Normal heart size. No pericardial effusion. Central veins appear patent. No veno-occlusive process. Mediastinum/Nodes: No enlarged mediastinal, hilar, or axillary lymph nodes. Thyroid gland, trachea, and esophagus demonstrate no significant findings. Lungs/Pleura: Lungs are clear. No pleural effusion or pneumothorax. Upper Abdomen: Remote cholecystectomy.  No acute finding. Musculoskeletal: No chest wall abnormality. No acute or significant osseous findings. Review of the MIP images confirms the above findings. IMPRESSION: Negative for significant acute pulmonary embolus by CTA. No other acute intrathoracic finding. Electronically Signed   By: Judie PetitM.  Shick M.D.   On: 07/20/2020 10:46    Procedures Procedures   Medications Ordered in ED Medications  iohexol (OMNIPAQUE) 350 MG/ML injection 75 mL (65 mLs Intravenous Contrast Given 07/20/20 1023)    ED Course  I have reviewed the triage vital signs and the nursing notes.  Pertinent labs & imaging results that were available during my care of the patient were reviewed by me and considered in my medical decision making (see chart for details).    MDM Rules/Calculators/A&P                          35 year old female who presents to the ER with complaints of chest pain, had a positive D-dimer in the office and sent here for PE study.  On arrival, vitals reassuring, no evidence of tachycardia, tachypnea or hypoxia.  She is overall well-appearing, speaking full sentences without increased work of breathing.  Lung sounds are clear.  Labs and imaging ordered, reviewed and interpreted by me.  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  D-dimer here is negative, and the last month.  Initial troponin of less than 2, low suspicion for ACS given this has been ongoing for  Chest x-ray unremarkable, CT angio negative for PE or any acute other acute abnormality.  Patient was informed of the reassuring findings.  Stressed follow-up with her cardiologist.  We discussed return precautions.  She voiced understanding and is agreeable.  Stable for discharge.   Final Clinical Impression(s) / ED Diagnoses Final diagnoses:  Chest pain, unspecified type    Rx / DC Orders ED Discharge Orders    None       Leone Brand 07/20/20 1101    Mancel Bale, MD 07/21/20 [phone]

## 2020-08-16 ENCOUNTER — Telehealth: Payer: Self-pay | Admitting: Hematology

## 2020-08-16 NOTE — Telephone Encounter (Signed)
Received a new hem referral from [facility] for hx of pe. Ms. Blanks returned my call and has been scheduled to see Dr. Candise Che on 5/25 at 1pm. Pt aware to arrive 20 minutes early.

## 2020-08-21 ENCOUNTER — Telehealth: Payer: Self-pay

## 2020-08-21 ENCOUNTER — Telehealth: Payer: Self-pay | Admitting: Hematology

## 2020-08-21 NOTE — Progress Notes (Incomplete)
HEMATOLOGY/ONCOLOGY CONSULTATION NOTE  Date of Service: 08/21/2020  Patient Care Team: Lesia Sago, MD as PCP - General (Internal Medicine)  CHIEF COMPLAINTS/PURPOSE OF CONSULTATION:  Hx of PE  HISTORY OF PRESENTING ILLNESS:  Abigail James is a wonderful 35 y.o. female who has been referred to [place] by [facility] for evaluation and management of hx of PE. The pt reports that he is doing well overall.  The pt reports ***  Lab results *** of CBC w/diff and CMP is as follows: all values are WNL except for ***  On review of systems, pt reports *** and denies *** and any other symptoms.   MEDICAL HISTORY:  Past Medical History:  Diagnosis Date  . Anxiety   . Asthma   . Depression   . DVT (deep venous thrombosis) (HCC)   . Ileitis     SURGICAL HISTORY: Past Surgical History:  Procedure Laterality Date  . APPENDECTOMY    . CESAREAN SECTION N/A 12/19/2016   Procedure: CESAREAN SECTION;  Surgeon: [place] Bing, MD;  Location: [facility] BIRTHING SUITES;  Service: Obstetrics;  Laterality: N/A;  . CHOLECYSTECTOMY    . DILATION AND CURETTAGE OF UTERUS    . FOOT SURGERY    . LEG SURGERY    . TYMPANOSTOMY TUBE PLACEMENT      SOCIAL HISTORY: Social History   Socioeconomic History  . Marital status: Single    Spouse name: Not on file  . Number of children: Not on file  . Years of education: Not on file  . Highest education level: Not on file  Occupational History  . Not on file  Tobacco Use  . Smoking status: Former Smoker    Quit date: 04/08/2014    Years since quitting: 6.3  . Smokeless tobacco: Never Used  Vaping Use  . Vaping Use: Never used  Substance and Sexual Activity  . Alcohol use: Not Currently    Comment: occasional  . Drug use: No  . Sexual activity: Yes    Birth control/protection: I.U.D.  Other Topics Concern  . Not on file  Social History Narrative   ** Merged History Encounter **       Social Determinants of Health    Financial Resource Strain: Not on file  Food Insecurity: Not on file  Transportation Needs: Not on file  Physical Activity: Not on file  Stress: Not on file  Social Connections: Not on file  Intimate Partner Violence: Not on file    FAMILY HISTORY: Family History  Problem Relation Age of Onset  . Hypertension Father   . Hypertension Brother   . Hypertension Maternal Uncle   . Cancer Maternal Grandmother   . Hypertension Paternal Grandmother   . Diabetes Paternal Grandmother   . Cancer Paternal Grandmother   . Colon cancer Neg Hx   . Colon polyps Neg Hx   . Esophageal cancer Neg Hx   . Rectal cancer Neg Hx   . Stomach cancer Neg Hx     ALLERGIES:  is allergic to ciprofloxacin, isosorbide, latex, clindamycin/lincomycin, vancomycin, amoxicillin, penicillins, and sulfa antibiotics.  MEDICATIONS:  Current Outpatient Medications  Medication Sig Dispense Refill  . acetaminophen (TYLENOL) 325 MG tablet Take 650 mg by mouth every 6 (six) hours as needed for mild pain.    . ASPIRIN LOW DOSE 81 MG EC tablet Take 81 mg by mouth every morning.    . busPIRone (BUSPAR) 10 MG tablet Take 10 mg by mouth 2 (two) times  daily as needed for anxiety.    Marland Kitchen etonogestrel-ethinyl estradiol (NUVARING) 0.12-0.015 MG/24HR vaginal ring Insert vaginally and leave in place for 3 consecutive weeks, then remove for 1 week. 3 each 3  . Hyoscyamine Sulfate SL (LEVSIN/SL) 0.125 MG SUBL Place 1 tablet under the tongue every 8 (eight) hours as needed. (Patient not taking: No sig reported) 30 tablet 1  . ibuprofen (ADVIL,MOTRIN) 200 MG tablet Take 200 mg by mouth every 6 (six) hours as needed for headache or mild pain.    . Multiple Vitamins-Minerals (MULTIVITAMIN WOMEN PO) Take 1 tablet by mouth daily.    Marland Kitchen omeprazole (PRILOSEC) 20 MG capsule Take 20 mg by mouth daily.     No current facility-administered medications for this visit.    REVIEW OF SYSTEMS:    10 Point review of Systems was done is negative  except as noted above.  PHYSICAL EXAMINATION: ECOG PERFORMANCE STATUS: {CHL ONC ECOG ON:6295284132}  .There were no vitals filed for this visit. There were no vitals filed for this visit. .There is no height or weight on file to calculate BMI.  *** GENERAL:alert, in no acute distress and comfortable SKIN: no acute rashes, no significant lesions EYES: conjunctiva are pink and non-injected, sclera anicteric OROPHARYNX: MMM, no exudates, no oropharyngeal erythema or ulceration NECK: supple, no JVD LYMPH:  no palpable lymphadenopathy in the cervical, axillary or inguinal regions LUNGS: clear to auscultation b/l with normal respiratory effort HEART: regular rate & rhythm ABDOMEN:  normoactive bowel sounds , non tender, not distended. Extremity: no pedal edema PSYCH: alert & oriented x 3 with fluent speech NEURO: no focal motor/sensory deficits  LABORATORY DATA:  I have reviewed the data as listed  . CBC Latest Ref Rng & Units 07/20/2020 06/29/2020 04/15/2019  WBC 4.0 - 10.5 K/uL 6.4 7.8 9.0  Hemoglobin 12.0 - 15.0 g/dL 16.2(H) 15.9(H) 15.4(H)  Hematocrit 36.0 - 46.0 % 46.7(H) 45.9 45.2  Platelets 150 - 400 K/uL 281 300 292.0    . CMP Latest Ref Rng & Units 07/20/2020 06/29/2020 03/30/2019  Glucose 70 - 99 mg/dL 440(N) 94 027(O)  BUN 6 - 20 mg/dL 5(L) <5(D) 10  Creatinine 0.44 - 1.00 mg/dL 6.64 4.03 4.74  Sodium 135 - 145 mmol/L 137 139 137  Potassium 3.5 - 5.1 mmol/L 3.0(L) 2.9(L) 3.7  Chloride 98 - 111 mmol/L 107 108 106  CO2 22 - 32 mmol/L 21(L) 23 20(L)  Calcium 8.9 - 10.3 mg/dL 9.1 8.9 9.3  Total Protein 6.5 - 8.1 g/dL - 7.0 7.4  Total Bilirubin 0.3 - 1.2 mg/dL - 0.8 1.0  Alkaline Phos 38 - 126 U/L - 60 56  AST 15 - 41 U/L - 18 14(L)  ALT 0 - 44 U/L - 15 13     RADIOGRAPHIC STUDIES: I have personally reviewed the radiological images as listed and agreed with the findings in the report. No results found.  ASSESSMENT & PLAN:    PLAN: -***   -Will see back  ***    FOLLOW UP: ***   All of the patients questions were answered with apparent satisfaction. The patient knows to call the clinic with any problems, questions or concerns.  I spent {CHL ONC TIME VISIT - QVZDG:[id number]} counseling the patient face to face. The total time spent in the appointment was {CHL ONC TIME VISIT - JJOAC:[id number]} and more than 50% was on counseling and direct patient cares.    Wyvonnia Lora MD MS AAHIVMS [facility] Hematology/Oncology Physician [facility]  (  Office):       [phone] (Work cell):  [phone] (Fax):           [phone]  08/21/2020 1:13 PM  I, Minda Meo, am acting as scribe for Dr. Wyvonnia Lora, MD.

## 2020-08-21 NOTE — Telephone Encounter (Signed)
This nurse spoke with patient who has a very bad cough and nasal congestion.  Pt took a home COVID test whch was negative, went to her PCP and had another COVID test done which was also negative.  Patient has a new patient appoint scheduled with Dr. Candise Che for 5/25/222 at 1pm and wants to know if she should reschedule.  This nurse advised patient it should be rescheduled and the schedulers will reach back out to her. No further questions or concerns at this time.

## 2020-08-21 NOTE — Telephone Encounter (Signed)
[name] has been rescheduled to see Dr. Candise Che to 6/1 at 11am.

## 2020-08-22 ENCOUNTER — Inpatient Hospital Stay: Payer: Medicaid Other | Admitting: Hematology

## 2020-08-28 NOTE — Progress Notes (Signed)
HEMATOLOGY/ONCOLOGY CONSULTATION NOTE  Date of Service: 08/29/2020  Patient Care Team: Lesia Sago, MD as PCP - General (Internal Medicine)  CHIEF COMPLAINTS/PURPOSE OF CONSULTATION:  Personal hx of PE  HISTORY OF PRESENTING ILLNESS:   Abigail James is a wonderful 35 y.o. female who has been referred to [place] by Aleatha Borer, NP at [facility] for evaluation and management of personal hx of PE. The pt reports that she is doing well overall.  The pt reports that she was seen by Dr. Marcheta Grammes at [facility]. The pt notes that she tried to make a f/u visit there, but due to it being more than three years she would have to see a different hematologist. The pt desired to just have care closer to home if she would need to see a new doctor.  The pt notes that starting in March and April, she has been feeling very sick. It started with chest pain and she thought it was anxiety. They did an ultrasound of er lower extremities and found no clots. She notes her feet constantly hurt and she is weak all over. She notes that she had pneumonia last month and is now being told she needs to see a Pulmonologist as well. The pt notes that as of right now she is not on any blood thinners. She went to Dr. Marcheta Grammes who told her that she has never had any type of blood clot. The pt notes that she has never had a clot, denying that she had a clot during pregnancy and in the lung. She has three children and was taken off Lovenox with no issues. She had a hematoma during pregnancy but notes everything is fine and her son is a healthy three year old.  She notes her legs are always cold and never hurt like a clot. She has been trying to stay warm and avoid being too cold. To her recollection, the pt notes that during her last visit with Dr. Marcheta Grammes she did not recommend any long-term blood thinners.   The pt notes she is here today to evaluate her bloodwork. She notes her cardiologist wanted her to be seen  and that he ordered the wrong ultrasounds for a clot.  Lab results 08/08/2020 of CBC w/diff and CMP is as follows: all values are WNL except for RBC of 5.70, Hgb of 16.4, HCT of 47.6. 08/08/2020 Magnesium of 2.5. 08/08/2020 D-Dimer of 0.92.  On review of systems, pt reports chest pain, leg pain, recent pneumonia, persistent cough and denies leg swelling, recent clot, and any other symptoms.  MEDICAL HISTORY:  Past Medical History:  Diagnosis Date  . Anxiety   . Asthma   . Depression   . DVT (deep venous thrombosis) (HCC)   . Ileitis     SURGICAL HISTORY: Past Surgical History:  Procedure Laterality Date  . APPENDECTOMY    . CESAREAN SECTION N/A 12/19/2016   Procedure: CESAREAN SECTION;  Surgeon: Iraan Bing, MD;  Location: [facility] BIRTHING SUITES;  Service: Obstetrics;  Laterality: N/A;  . CHOLECYSTECTOMY    . DILATION AND CURETTAGE OF UTERUS    . FOOT SURGERY    . LEG SURGERY    . TYMPANOSTOMY TUBE PLACEMENT      SOCIAL HISTORY: Social History   Socioeconomic History  . Marital status: Single    Spouse name: Not on file  . Number of children: Not on file  . Years of education: Not on file  . Highest education  level: Not on file  Occupational History  . Not on file  Tobacco Use  . Smoking status: Former Smoker    Quit date: 04/08/2014    Years since quitting: 6.3  . Smokeless tobacco: Never Used  Vaping Use  . Vaping Use: Never used  Substance and Sexual Activity  . Alcohol use: Not Currently    Comment: occasional  . Drug use: No  . Sexual activity: Yes    Birth control/protection: I.U.D.  Other Topics Concern  . Not on file  Social History Narrative   ** Merged History Encounter **       Social Determinants of Health   Financial Resource Strain: Not on file  Food Insecurity: Not on file  Transportation Needs: Not on file  Physical Activity: Not on file  Stress: Not on file  Social Connections: Not on file  Intimate Partner Violence: Not on file     FAMILY HISTORY: Family History  Problem Relation Age of Onset  . Hypertension Father   . Hypertension Brother   . Hypertension Maternal Uncle   . Cancer Maternal Grandmother   . Hypertension Paternal Grandmother   . Diabetes Paternal Grandmother   . Cancer Paternal Grandmother   . Colon cancer Neg Hx   . Colon polyps Neg Hx   . Esophageal cancer Neg Hx   . Rectal cancer Neg Hx   . Stomach cancer Neg Hx     ALLERGIES:  is allergic to ciprofloxacin, isosorbide, latex, vancomycin, amoxicillin, penicillins, and sulfa antibiotics.  MEDICATIONS:  Current Outpatient Medications  Medication Sig Dispense Refill  . etonogestrel-ethinyl estradiol (NUVARING) 0.12-0.015 MG/24HR vaginal ring Insert vaginally and leave in place for 3 consecutive weeks, then remove for 1 week. 3 each 3  . ibuprofen (ADVIL,MOTRIN) 200 MG tablet Take 200 mg by mouth every 6 (six) hours as needed for headache or mild pain.    . Multiple Vitamins-Minerals (MULTIVITAMIN WOMEN PO) Take 1 tablet by mouth daily.    Marland Kitchen omeprazole (PRILOSEC) 20 MG capsule Take 20 mg by mouth daily.    Marland Kitchen acetaminophen (TYLENOL) 325 MG tablet Take 650 mg by mouth every 6 (six) hours as needed for mild pain. (Patient not taking: Reported on 08/29/2020)    . ASPIRIN LOW DOSE 81 MG EC tablet Take 81 mg by mouth every morning.    . busPIRone (BUSPAR) 10 MG tablet Take 10 mg by mouth 2 (two) times daily as needed for anxiety.    . Hyoscyamine Sulfate SL (LEVSIN/SL) 0.125 MG SUBL Place 1 tablet under the tongue every 8 (eight) hours as needed. 30 tablet 1   No current facility-administered medications for this visit.    REVIEW OF SYSTEMS:    10 Point review of Systems was done is negative except as noted above.  PHYSICAL EXAMINATION: ECOG PERFORMANCE STATUS: 1 - Symptomatic but completely ambulatory  . Vitals:   08/29/20 1116  BP: (!) 141/99  Pulse: 71  Resp: 17  Temp: 98 F (36.7 C)  SpO2: 100%   Filed Weights   08/29/20 1116   Weight: 164 lb 3.2 oz (74.5 kg)   .Body mass index is 27.32 kg/m.  NAD. GENERAL:alert, in no acute distress and comfortable SKIN: no acute rashes, no significant lesions EYES: conjunctiva are pink and non-injected, sclera anicteric OROPHARYNX: MMM, no exudates, no oropharyngeal erythema or ulceration NECK: supple, no JVD LYMPH:  no palpable lymphadenopathy in the cervical, axillary or inguinal regions LUNGS: clear to auscultation b/l with normal respiratory effort HEART:  regular rate & rhythm ABDOMEN:  normoactive bowel sounds , non tender, not distended. Extremity: no pedal edema PSYCH: alert & oriented x 3 with fluent speech NEURO: no focal motor/sensory deficits  LABORATORY DATA:  I have reviewed the data as listed  . CBC Latest Ref Rng & Units 07/20/2020 06/29/2020 04/15/2019  WBC 4.0 - 10.5 K/uL 6.4 7.8 9.0  Hemoglobin 12.0 - 15.0 g/dL 16.2(H) 15.9(H) 15.4(H)  Hematocrit 36.0 - 46.0 % 46.7(H) 45.9 45.2  Platelets 150 - 400 K/uL 281 300 292.0    . CMP Latest Ref Rng & Units 07/20/2020 06/29/2020 03/30/2019  Glucose 70 - 99 mg/dL 175(Z) 94 025(E)  BUN 6 - 20 mg/dL 5(L) <5(I) 10  Creatinine 0.44 - 1.00 mg/dL 7.78 2.42 3.53  Sodium 135 - 145 mmol/L 137 139 137  Potassium 3.5 - 5.1 mmol/L 3.0(L) 2.9(L) 3.7  Chloride 98 - 111 mmol/L 107 108 106  CO2 22 - 32 mmol/L 21(L) 23 20(L)  Calcium 8.9 - 10.3 mg/dL 9.1 8.9 9.3  Total Protein 6.5 - 8.1 g/dL - 7.0 7.4  Total Bilirubin 0.3 - 1.2 mg/dL - 0.8 1.0  Alkaline Phos 38 - 126 U/L - 60 56  AST 15 - 41 U/L - 18 14(L)  ALT 0 - 44 U/L - 15 13     RADIOGRAPHIC STUDIES: I have personally reviewed the radiological images as listed and agreed with the findings in the report. No results found.  ASSESSMENT & PLAN:   #1 history of primary cryofibrinogenemia  Patient had previously reported VTE episodes in the past during her last visit-- and now denies this history. Has been on heparin prophylaxis with previous pregnancies.  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 Patient now denies this. She does not have outside records that can confirm her VTE episodes. She notes on her last visit with Dr Kerry Dory that no anticoagulation was recommended. 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 -Advised pt that her labs show dehydration at the time of collection and she was recommended attention to maintain good hydration 48-60oz of water daily. -Recommended that pt continue to f/u w [facility] for Cryofibrinogenemia, as this is more rare disorder and needs to be followed at an academic center. In the interim she should continue tofollow the last recommendations provided by Dr Marcheta Grammes since there really hasnt been any new events or VTE conditions since then. -Recommended pt drink enough water prior to drawing labs. Hydrate well prior. -Continue to f/u w PCP.  This is who would send Pulmonology referral.   FOLLOW UP: -Patient will call [facility] to re-establish hematology cares for recommendations regarding her cryofibrinogenemia -f/u with PCP -no f/u needed with Dr Candise Che   All of the patients questions were answered with apparent satisfaction. The patient knows to call the clinic with any problems, questions or concerns.  I spent 30 minutes counseling the patient face to face. The total time spent in the appointment was 45 minutes and more than 50% was on counseling and direct patient cares.    Wyvonnia Lora MD MS AAHIVMS [facility] Hematology/Oncology Physician [facility]  (Office):       [phone] (Work cell):  [phone] (Fax):           [phone]  08/29/2020 11:55 AM  I, Minda Meooss Judd, am acting as scribe for Dr. Wyvonnia LoraGautam Mystic Labo, MD.  .I have reviewed the above documentation for accuracy and  completeness, and I agree with the above. Johney Maine.Demoni Gergen Kishore Gaila Engebretsen MD

## 2020-08-29 ENCOUNTER — Other Ambulatory Visit: Payer: Self-pay

## 2020-08-29 ENCOUNTER — Inpatient Hospital Stay: Payer: Medicaid Other | Attending: Hematology | Admitting: Hematology

## 2020-08-29 VITALS — BP 141/99 | HR 71 | Temp 98.0°F | Resp 17 | Ht 65.0 in | Wt 164.2 lb

## 2020-08-29 DIAGNOSIS — Z86711 Personal history of pulmonary embolism: Secondary | ICD-10-CM | POA: Insufficient documentation

## 2020-08-29 DIAGNOSIS — D892 Hypergammaglobulinemia, unspecified: Secondary | ICD-10-CM | POA: Insufficient documentation

## 2020-08-29 DIAGNOSIS — Z809 Family history of malignant neoplasm, unspecified: Secondary | ICD-10-CM | POA: Diagnosis not present

## 2020-08-29 DIAGNOSIS — Z881 Allergy status to other antibiotic agents status: Secondary | ICD-10-CM | POA: Insufficient documentation

## 2020-08-29 DIAGNOSIS — F419 Anxiety disorder, unspecified: Secondary | ICD-10-CM | POA: Diagnosis not present

## 2020-08-29 DIAGNOSIS — Z880 Allergy status to penicillin: Secondary | ICD-10-CM | POA: Diagnosis not present

## 2020-08-29 DIAGNOSIS — Z833 Family history of diabetes mellitus: Secondary | ICD-10-CM | POA: Insufficient documentation

## 2020-08-29 DIAGNOSIS — R079 Chest pain, unspecified: Secondary | ICD-10-CM | POA: Diagnosis not present

## 2020-08-29 DIAGNOSIS — Z8249 Family history of ischemic heart disease and other diseases of the circulatory system: Secondary | ICD-10-CM | POA: Diagnosis not present

## 2020-08-29 DIAGNOSIS — Z79899 Other long term (current) drug therapy: Secondary | ICD-10-CM | POA: Diagnosis not present

## 2020-08-29 DIAGNOSIS — R531 Weakness: Secondary | ICD-10-CM | POA: Insufficient documentation

## 2020-08-29 DIAGNOSIS — Z882 Allergy status to sulfonamides status: Secondary | ICD-10-CM | POA: Insufficient documentation

## 2020-08-29 DIAGNOSIS — Z9049 Acquired absence of other specified parts of digestive tract: Secondary | ICD-10-CM | POA: Insufficient documentation

## 2020-08-29 DIAGNOSIS — Z793 Long term (current) use of hormonal contraceptives: Secondary | ICD-10-CM | POA: Insufficient documentation

## 2020-08-29 DIAGNOSIS — Z86718 Personal history of other venous thrombosis and embolism: Secondary | ICD-10-CM | POA: Insufficient documentation

## 2020-08-29 DIAGNOSIS — Z87891 Personal history of nicotine dependence: Secondary | ICD-10-CM | POA: Diagnosis not present

## 2020-09-03 ENCOUNTER — Emergency Department (HOSPITAL_COMMUNITY)
Admission: EM | Admit: 2020-09-03 | Discharge: 2020-09-03 | Disposition: A | Discharge status: Home / Self Care | Payer: Medicaid Other | Attending: Emergency Medicine | Admitting: Emergency Medicine

## 2020-09-03 ENCOUNTER — Encounter (HOSPITAL_COMMUNITY): Payer: Self-pay

## 2020-09-03 ENCOUNTER — Other Ambulatory Visit: Payer: Self-pay

## 2020-09-03 DIAGNOSIS — M25511 Pain in right shoulder: Secondary | ICD-10-CM | POA: Insufficient documentation

## 2020-09-03 DIAGNOSIS — Z91040 Latex allergy status: Secondary | ICD-10-CM | POA: Insufficient documentation

## 2020-09-03 DIAGNOSIS — J45909 Unspecified asthma, uncomplicated: Secondary | ICD-10-CM | POA: Insufficient documentation

## 2020-09-03 DIAGNOSIS — Z87891 Personal history of nicotine dependence: Secondary | ICD-10-CM | POA: Diagnosis not present

## 2020-09-03 DIAGNOSIS — M542 Cervicalgia: Secondary | ICD-10-CM | POA: Diagnosis present

## 2020-09-03 DIAGNOSIS — M62838 Other muscle spasm: Secondary | ICD-10-CM

## 2020-09-03 LAB — POC URINE PREG, ED: Preg Test, Ur: NEGATIVE

## 2020-09-03 MED ORDER — KETOROLAC TROMETHAMINE 30 MG/ML IJ SOLN
60.0000 mg | Freq: Once | INTRAMUSCULAR | Status: AC
  Administered 2020-09-03: 60 mg via INTRAMUSCULAR
  Filled 2020-09-03: qty 2

## 2020-09-03 MED ORDER — CYCLOBENZAPRINE HCL 5 MG PO TABS: 5.0000 mg | ORAL_TABLET | Freq: Three times a day (TID) | ORAL | 0 refills | Status: AC | PRN

## 2020-09-03 NOTE — ED Provider Notes (Signed)
[facility]-EMERGENCY DEPT Provider Note   CSN: [id number] Arrival date & time: 09/03/20  0809     History Chief Complaint  Patient presents with  . Shoulder Pain  . Neck Pain    Abigail James is a 35 y.o. female.  HPI   35 year old female with past medical history of anxiety/depression presents emergency department with right neck/shoulder pain.  Patient states 3 days ago she woke up with tightness in her right neck/shoulder musculature.  Her grandmother ended up passing away at that day and she has felt very tense, she feels like the tension and spasm is progressively getting worse.  She now can no longer turn her head to the right.  She denies any numbness/tingling or discoloration of the right upper extremity.  Denies any acute injury to the area.  Has been taking Tylenol/ibuprofen at home without significant relief.  Has otherwise been in her baseline health.  Past Medical History:  Diagnosis Date  . Anxiety   . Asthma   . Depression   . Ileitis     Patient Active Problem List   Diagnosis Date Noted  . Abnormal CT scan, small bowel 04/15/2019  . Generalized abdominal pain 04/15/2019  . History of hyperthyroidism 10/03/2016  . Personal history of pulmonary embolism 10/03/2016  . 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 APPENDECTOMY    . CESAREAN SECTION N/A 12/19/2016   Procedure: CESAREAN SECTION;  Surgeon: [place] Bing, MD;  Location: [facility] BIRTHING SUITES;  Service: Obstetrics;  Laterality: N/A;  . CHOLECYSTECTOMY    . DILATION AND CURETTAGE OF UTERUS    . FOOT SURGERY    . LEG SURGERY    . TYMPANOSTOMY TUBE PLACEMENT       OB History    Gravida  4   Para  3   Term  2   Preterm  1   AB  1   Living  3     SAB  1   IAB  0   Ectopic  0   Multiple  0   Live Births  3           Family History  Problem Relation Age of Onset  . Hypertension Father   . Hypertension  Brother   . Hypertension Maternal Uncle   . Cancer Maternal Grandmother   . Hypertension Paternal Grandmother   . Diabetes Paternal Grandmother   . Cancer Paternal Grandmother   . Colon cancer Neg Hx   . Colon polyps Neg Hx   . Esophageal cancer Neg Hx   . Rectal cancer Neg Hx   . Stomach cancer Neg Hx     Social History   Tobacco Use  . Smoking status: Former Smoker    Quit date: 04/08/2014    Years since quitting: 6.4  . Smokeless tobacco: Never Used  Vaping Use  . Vaping Use: Never used  Substance Use Topics  . Alcohol use: Not Currently  . Drug use: No    Home Medications Prior to Admission medications   Medication Sig Start Date End Date Taking? Authorizing Provider  acetaminophen (TYLENOL) 325 MG tablet Take 650 mg by mouth every 6 (six) hours as needed for mild pain. Patient not taking: Reported on 08/29/2020    [provider]  ASPIRIN LOW DOSE 81 MG EC tablet Take 81 mg by mouth every morning. 07/02/20   [provider]  busPIRone (BUSPAR) 10 MG tablet Take 10  mg by mouth 2 (two) times daily as needed for anxiety. 06/29/20   [provider]  etonogestrel-ethinyl estradiol (NUVARING) 0.12-0.015 MG/24HR vaginal ring Insert vaginally and leave in place for 3 consecutive weeks, then remove for 1 week. 02/01/20   Levie Heritage, DO  Hyoscyamine Sulfate SL (LEVSIN/SL) 0.125 MG SUBL Place 1 tablet under the tongue every 8 (eight) hours as needed. 04/15/19   Zehr, Princella Pellegrini, PA-C  ibuprofen (ADVIL,MOTRIN) 200 MG tablet Take 200 mg by mouth every 6 (six) hours as needed for headache or mild pain.    [provider]  Multiple Vitamins-Minerals (MULTIVITAMIN WOMEN PO) Take 1 tablet by mouth daily.    [provider]  omeprazole (PRILOSEC) 20 MG capsule Take 20 mg by mouth daily.    [provider]    Allergies    Ciprofloxacin, Isosorbide, Latex, Vancomycin, Amoxicillin, Penicillins, and Sulfa antibiotics  Review of Systems    Review of Systems  Constitutional: Negative for chills and fever.  Respiratory: Negative for shortness of breath.   Cardiovascular: Negative for chest pain.  Gastrointestinal: Negative for abdominal pain, diarrhea and vomiting.  Genitourinary: Negative for dysuria.  Musculoskeletal: Positive for neck pain and neck stiffness.       Shoulder muscle spasm  Skin: Negative for rash.  Neurological: Negative for headaches.    Physical Exam Updated Vital Signs BP 135/78 (BP Location: Left Arm)   Pulse 80   Temp 98.1 F (36.7 C) (Oral)   Resp 16   Ht 5\' 5"  (1.651 m)   Wt 74.5 kg   SpO2 97%   BMI 27.32 kg/m   Physical Exam Vitals and nursing note reviewed.  Constitutional:      Appearance: Normal appearance.  HENT:     Head: Normocephalic.     Mouth/Throat:     Mouth: Mucous membranes are moist.  Cardiovascular:     Rate and Rhythm: Normal rate.  Pulmonary:     Effort: Pulmonary effort is normal. No respiratory distress.  Abdominal:     Palpations: Abdomen is soft.     Tenderness: There is no abdominal tenderness.  Musculoskeletal:     Comments: Patient has limited range of motion especially with turning the head to the right, she has extensive reproducible tenderness to palpation in the right paracervical/trapezius and deltoid musculature, noted spasm, right upper extremity is otherwise neurovascularly intact, no midline spine/bony discomfort  Skin:    General: Skin is warm.  Neurological:     Mental Status: She is alert and oriented to person, place, and time. Mental status is at baseline.  Psychiatric:        Mood and Affect: Mood normal.     ED Results / Procedures / Treatments   Labs (all labs ordered are listed, but only abnormal results are displayed) Labs Reviewed  POC URINE PREG, ED    EKG None  Radiology No results found.  Procedures Procedures   Medications Ordered in ED Medications - No data to display  ED Course  I have reviewed the triage  vital signs and the nursing notes.  Pertinent labs & imaging results that were available during my care of the patient were reviewed by me and considered in my medical decision making (see chart for details).    MDM Rules/Calculators/A&P                          35 year old female presents emergency department what appears to be atraumatic  spasming of the right neck/trapezius and deltoid musculature.  Symptoms are very reproducible on exam, no bony involvement or concern for fracture.  Right upper extremity is otherwise neurovascularly intact.  Pregnancy test is negative, patient feels improved after Toradol. Improved ROM. We will plan for Tylenol/ibuprofen at home with a couple days of muscle relaxer for relief.  Strict return to ED instructions discussed.  Patient will be discharged and treated as an outpatient.  Discharge plan and strict return to ED precautions discussed, patient verbalizes understanding and agreement.  Final Clinical Impression(s) / ED Diagnoses Final diagnoses:  None    Rx / DC Orders ED Discharge Orders    None       Rozelle Logan, DO 09/03/20 1004

## 2020-09-03 NOTE — Discharge Instructions (Signed)
You have been seen and discharged from the emergency department.  Continue to take Tylenol and ibuprofen as needed for pain control.  Take muscle relaxer as directed.  Do not mix this medication with alcohol or other sedating medications. Do not drive or do heavy physical activity and to know how this medication affects you.  It may cause drowsiness.  Follow-up with your primary provider for reevaluation and further care. Take home medications as prescribed. If you have any worsening symptoms or further concerns for your health please return to an emergency department for further evaluation.

## 2020-09-03 NOTE — ED Triage Notes (Signed)
Patient states that she has right lateral neck to the right shoulder and right arm x 4 days. Patient states she is unable to turn her head to the right.

## 2020-12-19 ENCOUNTER — Other Ambulatory Visit: Payer: Self-pay | Admitting: Family Medicine

## 2021-01-23 ENCOUNTER — Other Ambulatory Visit: Payer: Self-pay

## 2021-01-24 ENCOUNTER — Encounter (HOSPITAL_BASED_OUTPATIENT_CLINIC_OR_DEPARTMENT_OTHER): Payer: Self-pay

## 2021-01-24 ENCOUNTER — Ambulatory Visit (HOSPITAL_BASED_OUTPATIENT_CLINIC_OR_DEPARTMENT_OTHER): Admit: 2021-01-24 | Payer: Medicaid Other | Admitting: Orthopedic Surgery

## 2021-01-24 SURGERY — EXCISION, GANGLION CYST, WRIST
Anesthesia: Regional | Site: Wrist | Laterality: Left

## 2021-02-01 DIAGNOSIS — J069 Acute upper respiratory infection, unspecified: Secondary | ICD-10-CM | POA: Diagnosis not present

## 2021-02-01 DIAGNOSIS — R197 Diarrhea, unspecified: Secondary | ICD-10-CM | POA: Diagnosis not present

## 2021-02-01 DIAGNOSIS — R051 Acute cough: Secondary | ICD-10-CM | POA: Diagnosis not present

## 2021-02-08 DIAGNOSIS — M67432 Ganglion, left wrist: Secondary | ICD-10-CM | POA: Diagnosis not present

## 2021-03-08 ENCOUNTER — Encounter: Payer: Self-pay | Admitting: Physician Assistant

## 2021-03-08 ENCOUNTER — Ambulatory Visit: Payer: Medicaid Other | Admitting: Physician Assistant

## 2021-03-08 ENCOUNTER — Other Ambulatory Visit (INDEPENDENT_AMBULATORY_CARE_PROVIDER_SITE_OTHER): Payer: Medicaid Other

## 2021-03-08 VITALS — BP 110/78 | HR 84 | Ht 65.0 in | Wt 177.0 lb

## 2021-03-08 DIAGNOSIS — R110 Nausea: Secondary | ICD-10-CM | POA: Diagnosis not present

## 2021-03-08 DIAGNOSIS — R140 Abdominal distension (gaseous): Secondary | ICD-10-CM | POA: Diagnosis not present

## 2021-03-08 DIAGNOSIS — R1031 Right lower quadrant pain: Secondary | ICD-10-CM

## 2021-03-08 DIAGNOSIS — R194 Change in bowel habit: Secondary | ICD-10-CM | POA: Diagnosis not present

## 2021-03-08 LAB — C-REACTIVE PROTEIN: CRP: <1 mg/dL (ref 0.5–20.0)

## 2021-03-08 LAB — CBC WITH DIFFERENTIAL/PLATELET
Basophils Absolute: 0.1 10*3/uL (ref 0.0–0.1)
Basophils Relative: 0.6 % (ref 0.0–3.0)
Eosinophils Absolute: 0.3 10*3/uL (ref 0.0–0.7)
Eosinophils Relative: 3.2 % (ref 0.0–5.0)
HCT: 42.4 % (ref 36.0–46.0)
Hemoglobin: 14.6 g/dL (ref 12.0–15.0)
Lymphocytes Relative: 31.2 % (ref 12.0–46.0)
Lymphs Abs: 2.7 10*3/uL (ref 0.7–4.0)
MCHC: 34.5 g/dL (ref 30.0–36.0)
MCV: 82 fl (ref 78.0–100.0)
Monocytes Absolute: 0.7 10*3/uL (ref 0.1–1.0)
Monocytes Relative: 7.9 % (ref 3.0–12.0)
Neutro Abs: 5 10*3/uL (ref 1.4–7.7)
Neutrophils Relative %: 57.1 % (ref 43.0–77.0)
Platelets: 274 10*3/uL (ref 150.0–400.0)
RBC: 5.17 Mil/uL — ABNORMAL HIGH (ref 3.87–5.11)
RDW: 12.6 % (ref 11.5–15.5)
WBC: 8.7 10*3/uL (ref 4.0–10.5)

## 2021-03-08 LAB — COMPREHENSIVE METABOLIC PANEL
ALT: 11 U/L (ref 0–35)
AST: 12 U/L (ref 0–37)
Albumin: 4 g/dL (ref 3.5–5.2)
Alkaline Phosphatase: 58 U/L (ref 39–117)
BUN: 6 mg/dL (ref 6–23)
CO2: 24 mEq/L (ref 19–32)
Calcium: 8.8 mg/dL (ref 8.4–10.5)
Chloride: 105 mEq/L (ref 96–112)
Creatinine, Ser: 0.6 mg/dL (ref 0.40–1.20)
GFR: 115.84 mL/min (ref >60.00)
Glucose, Bld: 85 mg/dL (ref 70–99)
Potassium: 3.4 mEq/L — ABNORMAL LOW (ref 3.5–5.1)
Sodium: 138 mEq/L (ref 135–145)
Total Bilirubin: 0.4 mg/dL (ref 0.2–1.2)
Total Protein: 6.7 g/dL (ref 6.0–8.3)

## 2021-03-08 LAB — HCG, QUANTITATIVE, PREGNANCY: Quantitative HCG: 0.61 m[IU]/mL

## 2021-03-08 LAB — SEDIMENTATION RATE: Sed Rate: 11 mm/hr (ref 0–20)

## 2021-03-08 MED ORDER — DICYCLOMINE HCL 10 MG PO CAPS: ORAL_CAPSULE | ORAL | 1 refills | Status: DC

## 2021-03-08 NOTE — Progress Notes (Signed)
Agree with assessment and plan as outlined.  

## 2021-03-08 NOTE — Progress Notes (Signed)
Subjective:    Patient ID: Abigail James, female    DOB: [birth year], 35 y.o.   MRN: [id number]  HPI Melea is a pleasant 35 year old white female, known to Dr. Havery Moros and last seen in the office in January 2021.  She comes in today with symptoms present over the past 3 weeks, with nausea with p.o. intake and diarrhea after eating.  She says the symptoms went on for about a week, she went on a very bland diet and says that seemed to help and the diarrhea has since resolved.  However she continues to feel bloated full and uncomfortable in her abdomen all the time and is still experiencing intermittent nausea, is also having some urgency with bowel movements and says that her bowel movements are irregular, sometimes hard and sometimes soft but no diarrhea, no melena or hematochezia.  She describes more chronic problems with gas and flatus.  She did not have any associated fever with her illness, no other family members ill, no new medications supplements etc. no recent antibiotics.  No history of lactose intolerance. She is status post cholecystectomy and appendectomy and has history of cryofibrinogenemia and prior history of PE.  She had undergone colonoscopy here in January 2021 after she had CT imaging the month before showing a nonspecific ileitis.  At that time she had been in the emergency room with right lower quadrant pain and diarrhea.  CT scan had also shown several subcentimeter low-attenuation lesions in the liver too small to characterize but statistically felt to represent hepatic cysts.  Colonoscopy was normal, normal-appearing TI, internal hemorrhoids. Patient says she has been under a lot of stress, her grandmother died in 2020-08-23 and she has a 61-year-old who has had health issues to she worries about constantly.   Review of Systems Pertinent positive and negative review of systems were noted in the above HPI section.  All other review of systems was otherwise negative.    Outpatient Encounter Medications as of 03/08/2021  Medication Sig   dicyclomine (BENTYL) 10 MG capsule Take 1 tablet 2-3 times daily as needed for abdominal pain/bloating.   etonogestrel-ethinyl estradiol (NUVARING) 0.12-0.015 MG/24HR vaginal ring INSERT 1 RING VAGINALLY AS DIRECTED. REMOVE AFTER 3 WEEKS & WAIT 7 DAYS BEFORE INSERTING A NEW RING   Multiple Vitamins-Minerals (MULTIVITAMIN WOMEN PO) Take 1 tablet by mouth daily.   omeprazole (PRILOSEC) 20 MG capsule Take 20 mg by mouth daily.   [DISCONTINUED] acetaminophen (TYLENOL) 325 MG tablet Take 650 mg by mouth every 6 (six) hours as needed for mild pain.   [DISCONTINUED] ASPIRIN LOW DOSE 81 MG EC tablet Take 81 mg by mouth every morning.   [DISCONTINUED] busPIRone (BUSPAR) 10 MG tablet Take 10 mg by mouth 2 (two) times daily as needed for anxiety.   [DISCONTINUED] Hyoscyamine Sulfate SL (LEVSIN/SL) 0.125 MG SUBL Place 1 tablet under the tongue every 8 (eight) hours as needed.   [DISCONTINUED] ibuprofen (ADVIL,MOTRIN) 200 MG tablet Take 200 mg by mouth every 6 (six) hours as needed for headache or mild pain. (Patient not taking: Reported on 03/08/2021)   No facility-administered encounter medications on file as of 03/08/2021.   Allergies  Allergen Reactions   Ciprofloxacin Anaphylaxis   Isosorbide Other (See Comments)    Patient has terrible headaches and bodyaches on Imdur.   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   Social History   Socioeconomic History   Marital status: Single    Spouse name: Not on file   Number of children: Not on file   Years of education: Not on file   Highest education level:  Not on file  Occupational History   Not on file  Tobacco Use   Smoking status: Former    Types: Cigarettes    Quit date: 04/08/2014    Years since quitting: 6.9   Smokeless tobacco: Never  Vaping Use   Vaping Use: Never used  Substance and Sexual Activity   Alcohol use: Not Currently   Drug use: No   Sexual activity: Yes    Birth control/protection: Other-see comments  Other Topics Concern   Not on file  Social History Narrative   ** Merged History Encounter **       Social Determinants of Health   Financial Resource Strain: Not on file  Food Insecurity: Not on file  Transportation Needs: Not on file  Physical Activity: Not on file  Stress: Not on file  Social Connections: Not on file  Intimate Partner Violence: Not on file    Ms. Fackler's family history includes Cancer in her maternal grandmother and paternal grandmother; Diabetes in her paternal grandmother; Hypertension in her brother, father, maternal uncle, and paternal grandmother.      Objective:    Vitals:   03/08/21 1329  BP: 110/78  Pulse: 84  SpO2: 99%    Physical Exam Well-developed well-nourished young WF  in no acute distress.  Height, [id number]  BMI 29.45  HEENT; nontraumatic normocephalic, EOMI, PE R LA, sclera anicteric. Oropharynx; not examined Neck; supple, no JVD Cardiovascular; regular rate and rhythm with S1-S2, no murmur rub or gallop Pulmonary; Clear bilaterally Abdomen; soft, there is tenderness in the right upper/right mid and right lower quadrant, no palpable mass or hepatosplenomegaly, bowel sounds are active Rectal; not done today Skin; benign exam, no jaundice rash or appreciable lesions Extremities; no clubbing cyanosis or edema skin warm and dry Neuro/Psych; alert and oriented x4, grossly nonfocal mood and affect appropriate        Assessment & Plan:   #11 35 year old white female with 3-week history of illness with nausea and diarrhea initially both worse with p.o.  intake.  Diarrhea has resolved after 1 week but she continues to have intermittent nausea and a fairly constant bloated full uncomfortable feeling in her abdomen, somewhat worse postprandially and some urgency for bowel movements, also with fairly chronic issues with gas.  On exam she has notable tenderness in the right upper right mid and right lower quadrant  Rule out intra-abdominal inflammatory process, rule out recurrent ileitis, underlying IBD Rule out postinfectious IBS  #2 status post cholecystectomy and appendectomy #3.  History of cryofibrinogenemia/prior PE  Plan; continue bland diet CBC with differential, c-Met, beta-hCG, sed rate, TTG and IgA, Patient will be scheduled for CT scan of the abdomen pelvis with contrast.  Start trial of Bentyl 10 mg p.o. twice daily to 3 times daily as needed for abdominal cramping/bloating Patient was provided with a copy of low gas diet, we also discussed avoidance of carbonated beverages and artificial sweeteners Further recommendations pending results of labs and CT.  Peg Fifer Genia Harold PA-C 03/08/2021   Cc: Laurance Flatten,*

## 2021-03-08 NOTE — Patient Instructions (Signed)
If you are age 36 or younger, your body mass index should be between 19-25. Your Body mass index is 29.45 kg/m. If this is out of the aformentioned range listed, please consider follow up with your Primary Care Provider.  ________________________________________________________  The [facility] providers would like to encourage you to use MYCHART to communicate with providers for non-urgent requests or questions.  Due to long hold times on the telephone, sending your provider a message by MYCHART may be a faster and more efficient way to get a response.  Please allow 48 business hours for a response.  Please remember that this is for non-urgent requests.  _______________________________________________________  You have been scheduled for a CT scan of the abdomen and pelvis at [facility], 1st floor Radiology. You are scheduled on 03/22/2021  at 4:00 pm. You should arrive 15 minutes prior to your appointment time for registration.  Please pick up 2 bottles of contrast from [facility] at least 3 days prior to your scan. The solution may taste better if refrigerated, but do NOT add ice or any other liquid to this solution. Shake well before drinking.   Please follow the written instructions below on the day of your exam:   1) Do not eat anything after 12:00 pm (4 hours prior to your test)   2) Drink 1 bottle of contrast @ 2:00 pm (2 hours prior to your exam)  Remember to shake well before drinking and do NOT pour over ice.     Drink 1 bottle of contrast @ 3:00 pm (1 hour prior to your exam)   You may take any medications as prescribed with a small amount of water, if necessary. If you take any of the following medications: METFORMIN, GLUCOPHAGE, GLUCOVANCE, AVANDAMET, RIOMET, FORTAMET, ACTOPLUS MET, JANUMET, GLUMETZA or METAGLIP, you MAY be asked to HOLD this medication 48 hours AFTER the exam.   The purpose of you drinking the oral contrast is to aid in the visualization of your intestinal  tract. The contrast solution may cause some diarrhea. Depending on your individual set of symptoms, you may also receive an intravenous injection of x-ray contrast/dye. Plan on being at [facility] for 45 minutes or longer, depending on the type of exam you are having performed.   If you have any questions regarding your exam or if you need to reschedule, you may call [facility] at [phone] between the hours of 8:00 am and 5:00 pm, Monday-Friday.   Your provider has requested that you go to the basement level for lab work before leaving today. Press "B" on the elevator. The lab is located at the first door on the left as you exit the elevator.  START Dicyclomine 10 mg 1 tablet 2-3 times a day as needed for abdominal pain/bloating.  Follow a bland diet.  Review the low gas diet and try to avoid soft drinks and artificial sweeteners.  Follow up  pending the results of your CT or as needed.  Thank you for entrusting me with your care and choosing [facility].  Amy Esterwood, PA-C 

## 2021-03-11 ENCOUNTER — Other Ambulatory Visit: Payer: Self-pay

## 2021-03-11 LAB — IGA: Immunoglobulin A: 91 mg/dL (ref 47–310)

## 2021-03-11 LAB — TISSUE TRANSGLUTAMINASE, IGA: (tTG) Ab, IgA: <1 U/mL

## 2021-03-11 MED ORDER — POTASSIUM CHLORIDE ER 20 MEQ PO TBCR: 20.0000 meq | EXTENDED_RELEASE_TABLET | Freq: Two times a day (BID) | ORAL | 0 refills | Status: DC

## 2021-03-19 DIAGNOSIS — R6883 Chills (without fever): Secondary | ICD-10-CM | POA: Diagnosis not present

## 2021-03-19 DIAGNOSIS — R059 Cough, unspecified: Secondary | ICD-10-CM | POA: Diagnosis not present

## 2021-03-19 DIAGNOSIS — R52 Pain, unspecified: Secondary | ICD-10-CM | POA: Diagnosis not present

## 2021-03-21 ENCOUNTER — Encounter (HOSPITAL_COMMUNITY): Payer: Self-pay | Admitting: Emergency Medicine

## 2021-03-21 ENCOUNTER — Other Ambulatory Visit: Payer: Self-pay

## 2021-03-21 ENCOUNTER — Emergency Department (HOSPITAL_COMMUNITY): Payer: Medicaid Other

## 2021-03-21 ENCOUNTER — Emergency Department (HOSPITAL_COMMUNITY)
Admission: EM | Admit: 2021-03-21 | Discharge: 2021-03-22 | Disposition: A | Discharge status: Home / Self Care | Payer: Medicaid Other | Attending: Emergency Medicine | Admitting: Emergency Medicine

## 2021-03-21 DIAGNOSIS — J9811 Atelectasis: Secondary | ICD-10-CM | POA: Diagnosis not present

## 2021-03-21 DIAGNOSIS — R0602 Shortness of breath: Secondary | ICD-10-CM | POA: Diagnosis not present

## 2021-03-21 DIAGNOSIS — Z20822 Contact with and (suspected) exposure to covid-19: Secondary | ICD-10-CM | POA: Diagnosis not present

## 2021-03-21 DIAGNOSIS — R051 Acute cough: Secondary | ICD-10-CM | POA: Diagnosis not present

## 2021-03-21 DIAGNOSIS — Z79899 Other long term (current) drug therapy: Secondary | ICD-10-CM | POA: Insufficient documentation

## 2021-03-21 DIAGNOSIS — Z87891 Personal history of nicotine dependence: Secondary | ICD-10-CM | POA: Insufficient documentation

## 2021-03-21 DIAGNOSIS — E876 Hypokalemia: Secondary | ICD-10-CM | POA: Diagnosis not present

## 2021-03-21 DIAGNOSIS — Z91040 Latex allergy status: Secondary | ICD-10-CM | POA: Insufficient documentation

## 2021-03-21 DIAGNOSIS — J101 Influenza due to other identified influenza virus with other respiratory manifestations: Secondary | ICD-10-CM | POA: Diagnosis not present

## 2021-03-21 DIAGNOSIS — R059 Cough, unspecified: Secondary | ICD-10-CM | POA: Diagnosis not present

## 2021-03-21 DIAGNOSIS — J45909 Unspecified asthma, uncomplicated: Secondary | ICD-10-CM | POA: Insufficient documentation

## 2021-03-21 LAB — CBC WITH DIFFERENTIAL/PLATELET
Abs Immature Granulocytes: 0.01 10*3/uL (ref 0.00–0.07)
Basophils Absolute: 0 10*3/uL (ref 0.0–0.1)
Basophils Relative: 1 %
Eosinophils Absolute: 0.1 10*3/uL (ref 0.0–0.5)
Eosinophils Relative: 2 %
HCT: 46 % (ref 36.0–46.0)
Hemoglobin: 16 g/dL — ABNORMAL HIGH (ref 12.0–15.0)
Immature Granulocytes: 0 %
Lymphocytes Relative: 38 %
Lymphs Abs: 2.2 10*3/uL (ref 0.7–4.0)
MCH: 28.4 pg (ref 26.0–34.0)
MCHC: 34.8 g/dL (ref 30.0–36.0)
MCV: 81.7 fL (ref 80.0–100.0)
Monocytes Absolute: 0.6 10*3/uL (ref 0.1–1.0)
Monocytes Relative: 10 %
Neutro Abs: 2.8 10*3/uL (ref 1.7–7.7)
Neutrophils Relative %: 49 %
Platelets: 269 10*3/uL (ref 150–400)
RBC: 5.63 MIL/uL — ABNORMAL HIGH (ref 3.87–5.11)
RDW: 12.3 % (ref 11.5–15.5)
WBC: 5.7 10*3/uL (ref 4.0–10.5)
nRBC: 0 % (ref 0.0–0.2)

## 2021-03-21 LAB — BASIC METABOLIC PANEL
Anion gap: 9 (ref 5–15)
BUN: 8 mg/dL (ref 6–20)
CO2: 21 mmol/L — ABNORMAL LOW (ref 22–32)
Calcium: 8.7 mg/dL — ABNORMAL LOW (ref 8.9–10.3)
Chloride: 109 mmol/L (ref 98–111)
Creatinine, Ser: 0.63 mg/dL (ref 0.44–1.00)
GFR, Estimated: >60 mL/min (ref >60)
Glucose, Bld: 108 mg/dL — ABNORMAL HIGH (ref 70–99)
Potassium: 3.3 mmol/L — ABNORMAL LOW (ref 3.5–5.1)
Sodium: 139 mmol/L (ref 135–145)

## 2021-03-21 LAB — RESP PANEL BY RT-PCR (FLU A&B, COVID) ARPGX2
Influenza A by PCR: POSITIVE — AB
Influenza B by PCR: NEGATIVE
SARS Coronavirus 2 by RT PCR: NEGATIVE

## 2021-03-21 MED ORDER — POTASSIUM CHLORIDE CRYS ER 20 MEQ PO TBCR
40.0000 meq | EXTENDED_RELEASE_TABLET | Freq: Once | ORAL | Status: AC
  Administered 2021-03-21: 23:00:00 40 meq via ORAL
  Filled 2021-03-21: qty 2

## 2021-03-21 MED ORDER — BENZONATATE 100 MG PO CAPS
100.0000 mg | ORAL_CAPSULE | Freq: Once | ORAL | Status: AC
  Administered 2021-03-21: 23:00:00 100 mg via ORAL
  Filled 2021-03-21: qty 1

## 2021-03-21 MED ORDER — NAPROXEN 500 MG PO TABS: 500.0000 mg | ORAL_TABLET | Freq: Two times a day (BID) | ORAL | 0 refills | Status: DC | PRN

## 2021-03-21 MED ORDER — SODIUM CHLORIDE 0.9 % IV BOLUS
1000.0000 mL | Freq: Once | INTRAVENOUS | Status: AC
  Administered 2021-03-21: 23:00:00 1000 mL via INTRAVENOUS

## 2021-03-21 MED ORDER — POTASSIUM CHLORIDE CRYS ER 20 MEQ PO TBCR: 20.0000 meq | EXTENDED_RELEASE_TABLET | Freq: Every day | ORAL | 0 refills | Status: DC

## 2021-03-21 MED ORDER — BENZONATATE 200 MG PO CAPS: 200.0000 mg | ORAL_CAPSULE | Freq: Three times a day (TID) | ORAL | 0 refills | Status: DC | PRN

## 2021-03-21 MED ORDER — IPRATROPIUM-ALBUTEROL 0.5-2.5 (3) MG/3ML IN SOLN
3.0000 mL | Freq: Once | RESPIRATORY_TRACT | Status: AC
  Administered 2021-03-21: 23:00:00 3 mL via RESPIRATORY_TRACT
  Filled 2021-03-21: qty 3

## 2021-03-21 MED ORDER — IOHEXOL 350 MG/ML SOLN
80.0000 mL | Freq: Once | INTRAVENOUS | Status: AC | PRN
  Administered 2021-03-21: 20:00:00 80 mL via INTRAVENOUS

## 2021-03-21 NOTE — ED Notes (Signed)
Per PCP-labs show D-Dimer 900 and Hgb of 16-sending for eval

## 2021-03-21 NOTE — ED Provider Notes (Signed)
Emergency Medicine Provider Triage Evaluation Note  Abigail James , a 35 y.o. female  was evaluated in triage.  Pt complains of shortness of breath and cough.  She states beginning Tuesday she started having body aches and cough.  She states that she was seen by primary care and tested for flu which was negative.  She states that after Tuesday the body aches went away however she continued to have cough and worsening shortness of breath.  She endorses fatigue.  She denies having any fevers, nausea, vomiting, abdominal pain, diarrhea.  She was sent here for elevated D-dimer to 900.  It does appear that she had a previous elevated D-dimer in April and did not have thromboembolic disease at that time.  She does have a history of cryofibrinogenemia and sees a hematologist for this however she does not take any medication for it.  Denies any recent travel history, swelling to the bilateral lower extremities, smoking.  She did have surgery at the end of October and uses estrogen  Review of Systems  Positive: See above Negative:   Physical Exam  BP (!) 144/106    Pulse (!) 112    Temp 98.2 F (36.8 C) (Oral)    Resp 18    Ht 5\' 5"  (1.651 m)    Wt 81 kg    SpO2 96%    BMI 29.72 kg/m  Gen:   Awake, no distress   Resp:  Normal effort, continual coughing in the room.  Lungs clear to auscultation bilaterally MSK:   Moves extremities without difficulty  Other:  S1/S2 with tachycardia.  No murmur.  Pulses equal bilaterally.  Medical Decision Making  Medically screening exam initiated at 6:44 PM.  Appropriate orders placed.  Abigail James was informed that the remainder of the evaluation will be completed by another provider, this initial triage assessment does not replace that evaluation, and the importance of remaining in the ED until their evaluation is complete.  Given elevated dimer will CTA chest   Abigail Heath, PA-C 03/21/21 1846    03/23/21, MD 04/03/21 1539

## 2021-03-21 NOTE — Discharge Instructions (Addendum)
You were seen in the emergency department today and tested positive for influenza A which we suspect is the cause of your symptoms.  Your blood work showed that your potassium was mildly low at 3.3, related to take a potassium supplement for the next few days and include potassium rich foods in your diet, have this rechecked by her primary care provider.  Your CT scan did not show findings of a blood clot.  Please continue to use the albuterol inhaler given by your primary care provider and take Tamiflu as prescribed.  We are sending you home with Tessalon to take every 8 hours as needed for coughing as well as naproxen to take every 12 hours as needed for pain.  Take naproxen with food as it can cause stomach upset and it worsens stomach bleeding.  Do not take other NSAIDs with this medicine such as Motrin, Advil, Aleve, ibuprofen, Mobic, Goody powder, etc. these are similar medicines.  We have prescribed you new medication(s) today. Discuss the medications prescribed today with your pharmacist as they can have adverse effects and interactions with your other medicines including over the counter and prescribed medications. Seek medical evaluation if you start to experience new or abnormal symptoms after taking one of these medicines, seek care immediately if you start to experience difficulty breathing, feeling of your throat closing, facial swelling, or rash as these could be indications of a more serious allergic reaction  Please follow-up with primary care within 3 days.  Return to the emergency department for new or worsening symptoms including but not limited to new or worsening pain, worsening trouble breathing, passing out, coughing up blood, inability to keep fluids down, or any other concerns.

## 2021-03-21 NOTE — ED Notes (Signed)
Patient transported to CT 

## 2021-03-21 NOTE — ED Triage Notes (Signed)
Patient has been SOB w/ cough since Tuesday, sent by UC for high D-dimer. Reports fatigue. Denies fevers, reports nausea.

## 2021-03-21 NOTE — ED Provider Notes (Signed)
[facility]-EMERGENCY DEPT Provider Note   CSN: [id number] Arrival date & time: 03/21/21  1823     History Chief Complaint  Patient presents with   Shortness of Breath    Abigail James is a 35 y.o. female with a hx of asthma, cryofibrinogenemia, anxiety, and depression who presents to the ED with complaints of cough x 3 days. Patient reports she has been feeling poorly with congestion, cough productive of phlegm sputum, dyspnea, and palpitations. Has some left sided chest pain and nausea with coughing, otherwise no chest pain. No other alleviating/aggravating factors. Seen by outpatient provider day of onset 03/19/21, had negative rapid covid/flu testing, given a steroid shot as well as some tamiflu as her child was recently positive for influenza. Taking meds as prescribed without much change, cough & breathing have felt worse, therefore returned to PCP today. Given albuterol inhaler which at times helps, they obtained blood work and she was noted to have an elevated d-dimer therefore she was referred to the ED. She states she thinks she is dehydrated. Denies fever, vomiting, diarrhea, wheezing, leg pain/swelling, recent trauma, recent long travel, personal hx of cancer, or hx of DVT/PE. Reports left wrist surgery somewhat recently in October, uses estrogen, and did cough up some blood but this was after a nose bleed otherwise no hemoptysis.       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    Past Surgical History:  Procedure Laterality Date   APPENDECTOMY     CESAREAN SECTION N/A 12/19/2016   Procedure: CESAREAN SECTION;  Surgeon: [place] Bing, MD;  Location: [facility] BIRTHING SUITES;  Service: Obstetrics;  Laterality: N/A;   CHOLECYSTECTOMY     DILATION AND CURETTAGE OF UTERUS     FOOT SURGERY     LEG SURGERY     TYMPANOSTOMY TUBE PLACEMENT       OB History     Gravida  4   Para  3   Term  2   Preterm  1   AB  1   Living  3      SAB  1   IAB  0   Ectopic  0   Multiple  0   Live Births  3           Family History  Problem Relation Age of Onset   Hypertension Father    Hypertension Brother    Hypertension Maternal Uncle    Cancer Maternal Grandmother    Hypertension Paternal Grandmother    Diabetes Paternal Grandmother    Cancer Paternal Grandmother    Colon cancer Neg Hx    Colon polyps Neg Hx    Esophageal cancer Neg Hx    Rectal cancer Neg Hx    Stomach cancer Neg Hx     Social History   Tobacco Use   Smoking status: Former    Types: Cigarettes    Quit date: 04/08/2014    Years since quitting: 6.9   Smokeless tobacco: Never  Vaping Use   Vaping Use: Never used  Substance Use Topics   Alcohol use: Not Currently  Drug use: No    Home Medications Prior to Admission medications   Medication Sig Start Date End Date Taking? Authorizing Provider  dicyclomine (BENTYL) 10 MG capsule Take 1 tablet 2-3 times daily as needed for abdominal pain/bloating. 03/08/21   Esterwood, Amy S, PA-C  etonogestrel-ethinyl estradiol (NUVARING) 0.12-0.015 MG/24HR vaginal ring INSERT 1 RING VAGINALLY AS DIRECTED. REMOVE AFTER 3 WEEKS & WAIT 7 DAYS BEFORE INSERTING A NEW RING 12/21/20   Levie Heritage, DO  Multiple Vitamins-Minerals (MULTIVITAMIN WOMEN PO) Take 1 tablet by mouth daily.    [provider]  omeprazole (PRILOSEC) 20 MG capsule Take 20 mg by mouth daily.    [provider]  Potassium Chloride ER 20 MEQ TBCR Take 20 mEq by mouth 2 (two) times daily for 3 days. 03/11/21 03/14/21  Esterwood, Amy S, PA-C    Allergies    Ciprofloxacin, Isosorbide, Latex, Vancomycin,  Amoxicillin, and Sulfa antibiotics  Review of Systems   Review of Systems  Constitutional:  Negative for fever.  HENT:  Positive for congestion. Negative for ear pain.   Respiratory:  Positive for cough and shortness of breath. Negative for wheezing.   Cardiovascular:  Positive for chest pain (w/ coughing) and palpitations. Negative for leg swelling.  Gastrointestinal:  Positive for nausea. Negative for abdominal pain, diarrhea and vomiting.  Genitourinary:  Negative for dysuria.  Neurological:  Negative for syncope.  All other systems reviewed and are negative.  Physical Exam Updated Vital Signs BP 118/88    Pulse 72    Temp 98.2 F (36.8 C) (Oral)    Resp 19    Ht 5\' 5"  (1.651 m)    Wt 81 kg    SpO2 97%    BMI 29.72 kg/m   Physical Exam Vitals and nursing note reviewed.  Constitutional:      General: She is not in acute distress.    Appearance: She is well-developed.  HENT:     Head: Normocephalic and atraumatic.     Right Ear: Ear canal normal. Tympanic membrane is not perforated, erythematous, retracted or bulging.     Left Ear: Ear canal normal. Tympanic membrane is not perforated, erythematous, retracted or bulging.     Ears:     Comments: No mastoid erythema/swelling/tenderness.     Nose:     Right Sinus: No maxillary sinus tenderness or frontal sinus tenderness.     Left Sinus: No maxillary sinus tenderness or frontal sinus tenderness.     Mouth/Throat:     Pharynx: Uvula midline. No oropharyngeal exudate or posterior oropharyngeal erythema.     Comments: Posterior oropharynx is symmetric appearing. Patient tolerating own secretions without difficulty. No trismus. No drooling. No hot potato voice. No swelling beneath the tongue, submandibular compartment is soft.  Eyes:     General:        Right eye: No discharge.        Left eye: No discharge.     Conjunctiva/sclera: Conjunctivae normal.     Pupils: Pupils are equal, round, and reactive to light.  Cardiovascular:      Rate and Rhythm: Normal rate and regular rhythm.     Heart sounds: No murmur heard. Pulmonary:     Effort: Pulmonary effort is normal. No respiratory distress.     Breath sounds: No wheezing, rhonchi or rales.     Comments: Somewhat poor air movement.  Chest:     Chest wall: Tenderness (left anterior chest wall) present.  Abdominal:     General:  There is no distension.     Palpations: Abdomen is soft.     Tenderness: There is no abdominal tenderness.  Musculoskeletal:     Cervical back: Normal range of motion and neck supple. No edema or rigidity.     Right lower leg: No edema.     Left lower leg: No edema.  Lymphadenopathy:     Cervical: No cervical adenopathy.  Skin:    General: Skin is warm and dry.     Findings: No rash.  Neurological:     Mental Status: She is alert.  Psychiatric:        Behavior: Behavior normal.    ED Results / Procedures / Treatments   Labs (all labs ordered are listed, but only abnormal results are displayed) Labs Reviewed  RESP PANEL BY RT-PCR (FLU A&B, COVID) ARPGX2 - Abnormal; Notable for the following components:      Result Value   Influenza A by PCR POSITIVE (*)    All other components within normal limits  BASIC METABOLIC PANEL - Abnormal; Notable for the following components:   Potassium 3.3 (*)    CO2 21 (*)    Glucose, Bld 108 (*)    Calcium 8.7 (*)    All other components within normal limits  CBC WITH DIFFERENTIAL/PLATELET - Abnormal; Notable for the following components:   RBC 5.63 (*)    Hemoglobin 16.0 (*)    All other components within normal limits    EKG EKG Interpretation  Date/Time:  Thursday March 21 2021 19:16:51 EST Ventricular Rate:  93 PR Interval:  138 QRS Duration: 82 QT Interval:  327 QTC Calculation: 407 R Axis:   36 Text Interpretation: Sinus rhythm since last tracing no significant change Confirmed by Mancel Bale [phone]) on 03/21/2021 8:25:56 PM  Radiology CT Angio Chest PE W/Cm &/Or Wo  Cm  Result Date: 03/21/2021 CLINICAL DATA:  Shortness of breath with cough for 2 days. Clinical concern for pulmonary embolism. Elevated D-dimer levels. EXAM: CT ANGIOGRAPHY CHEST WITH CONTRAST TECHNIQUE: Multidetector CT imaging of the chest was performed using the standard protocol during bolus administration of intravenous contrast. Multiplanar CT image reconstructions and MIPs were obtained to evaluate the vascular anatomy. CONTRAST:  42mL OMNIPAQUE IOHEXOL 350 MG/ML SOLN COMPARISON:  Chest CTA 07/20/2020. FINDINGS: Cardiovascular: The pulmonary arteries are well opacified with contrast to the level of the subsegmental branches. There is no evidence of acute pulmonary embolism. No significant systemic arterial abnormalities are identified. The heart size is normal. There is no pericardial effusion. Mediastinum/Nodes: There are no enlarged mediastinal, hilar or axillary lymph nodes. The thyroid gland, trachea and esophagus demonstrate no significant findings. Lungs/Pleura: There is no pleural effusion or pneumothorax. Pulmonary assessment mildly limited by breathing artifact. No confluent airspace opacity or suspicious pulmonary nodularity. Mild dependent atelectasis bilaterally. Upper abdomen: The visualized upper abdomen appears stable without significant findings. Musculoskeletal/Chest wall: There is no chest wall mass or suspicious osseous finding. Review of the MIP images confirms the above findings. IMPRESSION: No evidence of acute pulmonary embolism or other acute chest findings. Mild dependent atelectasis in both lungs. Electronically Signed   By: Carey Bullocks M.D.   On: 03/21/2021 20:06    Procedures Procedures   Medications Ordered in ED Medications  benzonatate (TESSALON) capsule 100 mg (has no administration in time range)  iohexol (OMNIPAQUE) 350 MG/ML injection 80 mL (80 mLs Intravenous Contrast Given 03/21/21 1943)    ED Course  I have reviewed the triage vital signs and the  nursing notes.  Pertinent labs & imaging results that were available during my care of the patient were reviewed by me and considered in my medical decision making (see chart for details).    MDM Rules/Calculators/A&P                            Patient presents to the ED with complaints of cough/dyspnea for the past 3 days with elevated outpatient D-dimer testing.. Nontoxic, vitals initially with tachycardia which has normalized on my exam.  Additional history obtained:  Additional history obtained from chart review & nursing note review.   Lab Tests:  I reviewed and interpreted labs, which included:  CBC: no leukocytosis or critical anemia.  BMP: mild hypokalemia.  RVP: Positive for influenza A.   Imaging Studies ordered:  CTA ordered in triage- I independently reviewed, formal radiology impression shows: No evidence of acute pulmonary embolism or other acute chest findings. Mild dependent atelectasis in both lungs.  ED Course:  CTA w/o PE or focal infiltrate. Labs overall fairly reassuring, EKG with sinus rhythm. Patient with somewhat poor air movement on initial exam, however no adventitious breath sounds, will give DuoNeb and reassess, fluids ordered for hydration and potassium ordered for hypokalemia.  23:30: Re-eval: improved air movement, remains without adventitious breath sounds, patient states she feels improved. Ambulatory SpO2 >98% on RA without increased work of breathing. Suspect symptomatic from influenza A, currently on tamiflu, will provide additional supportive care. I discussed results, treatment plan, need for follow-up, and return precautions with the patient. Provided opportunity for questions, patient confirmed understanding and is in agreement with plan.   Blood pressure 129/86, pulse 76, temperature 98.2 F (36.8 C), temperature source Oral, resp. rate 16, height  (1.651 m), weight 81 kg, SpO2 100 %.   Portions of this note were generated with Administrator, sports. Dictation errors may occur despite best attempts at proofreading.  Final Clinical Impression(s) / ED Diagnoses Final diagnoses:  Influenza A  Hypokalemia    Rx / DC Orders ED Discharge Orders          Ordered    benzonatate (TESSALON) 200 MG capsule  3 times daily PRN        03/21/21 2350    naproxen (NAPROSYN) 500 MG tablet  2 times daily PRN        03/21/21 2350    potassium chloride SA (KLOR-CON M) 20 MEQ tablet  Daily        03/21/21 2350           Abigail James was evaluated in Emergency Department on 03/21/2021 for the symptoms described in the history of present illness. He/she was evaluated in the context of the global COVID-19 pandemic, which necessitated consideration that the patient might be at risk for infection with the SARS-CoV-2 virus that causes COVID-19. Institutional protocols and algorithms that pertain to the evaluation of patients at risk for COVID-19 are in a state of rapid change based on information released by regulatory bodies including the CDC and federal and state organizations. These policies and algorithms were followed during the patient's care in the ED.    Desmond Lope 03/21/21 2353    Vanetta Mulders, MD 04/03/21 [phone]

## 2021-03-22 ENCOUNTER — Ambulatory Visit (HOSPITAL_COMMUNITY): Admission: RE | Admit: 2021-03-22 | Payer: Medicaid Other | Source: Ambulatory Visit

## 2021-03-22 NOTE — ED Notes (Signed)
Pt A&OX4 ambulatory at d/c with independent steady gait, NAD. Pt states she feels much better. Pt verbalized understanding of d/c instructions, prescriptions and follow up care.

## 2021-03-22 NOTE — ED Notes (Signed)
Pt ambulated fine and SpO2 stayed between 99-100%

## 2021-03-23 ENCOUNTER — Telehealth: Payer: Self-pay

## 2021-03-23 NOTE — Telephone Encounter (Signed)
Transition Care Management Unsuccessful Follow-up Telephone Call  Date of discharge and from where:  03/22/2021-[facility]  Attempts:  1st Attempt  Reason for unsuccessful TCM follow-up call:  Left voice message

## 2021-03-28 NOTE — Telephone Encounter (Signed)
Transition Care Management Unsuccessful Follow-up Telephone Call  Date of discharge and from where:  03/22/2021 from [facility]  Attempts:  2nd Attempt  Reason for unsuccessful TCM follow-up call:  Left voice message

## 2021-03-29 NOTE — Telephone Encounter (Signed)
Transition Care Management Unsuccessful Follow-up Telephone Call  Date of discharge and from where:  03/22/2021 from [facility]  Attempts:  3rd Attempt  Reason for unsuccessful TCM follow-up call:  Unable to reach patient

## 2021-06-18 DIAGNOSIS — M5442 Lumbago with sciatica, left side: Secondary | ICD-10-CM | POA: Diagnosis not present

## 2021-07-18 DIAGNOSIS — F5102 Adjustment insomnia: Secondary | ICD-10-CM | POA: Diagnosis not present

## 2021-07-18 DIAGNOSIS — F419 Anxiety disorder, unspecified: Secondary | ICD-10-CM | POA: Diagnosis not present

## 2021-07-18 DIAGNOSIS — F32A Depression, unspecified: Secondary | ICD-10-CM | POA: Diagnosis not present

## 2021-07-18 DIAGNOSIS — F329 Major depressive disorder, single episode, unspecified: Secondary | ICD-10-CM | POA: Diagnosis not present

## 2021-08-12 ENCOUNTER — Telehealth: Payer: Self-pay | Admitting: Physician Assistant

## 2021-08-12 ENCOUNTER — Emergency Department (HOSPITAL_COMMUNITY): Payer: 59

## 2021-08-12 ENCOUNTER — Encounter (HOSPITAL_COMMUNITY): Payer: Self-pay

## 2021-08-12 ENCOUNTER — Other Ambulatory Visit: Payer: Self-pay

## 2021-08-12 ENCOUNTER — Emergency Department (HOSPITAL_COMMUNITY)
Admission: EM | Admit: 2021-08-12 | Discharge: 2021-08-12 | Disposition: A | Discharge status: Home / Self Care | Payer: 59 | Attending: Emergency Medicine | Admitting: Emergency Medicine

## 2021-08-12 ENCOUNTER — Telehealth: Payer: Self-pay

## 2021-08-12 DIAGNOSIS — K529 Noninfective gastroenteritis and colitis, unspecified: Secondary | ICD-10-CM | POA: Diagnosis not present

## 2021-08-12 DIAGNOSIS — E876 Hypokalemia: Secondary | ICD-10-CM | POA: Diagnosis not present

## 2021-08-12 DIAGNOSIS — E878 Other disorders of electrolyte and fluid balance, not elsewhere classified: Secondary | ICD-10-CM | POA: Diagnosis not present

## 2021-08-12 DIAGNOSIS — K760 Fatty (change of) liver, not elsewhere classified: Secondary | ICD-10-CM | POA: Diagnosis not present

## 2021-08-12 DIAGNOSIS — Z91040 Latex allergy status: Secondary | ICD-10-CM | POA: Insufficient documentation

## 2021-08-12 DIAGNOSIS — R197 Diarrhea, unspecified: Secondary | ICD-10-CM

## 2021-08-12 DIAGNOSIS — D72829 Elevated white blood cell count, unspecified: Secondary | ICD-10-CM | POA: Insufficient documentation

## 2021-08-12 HISTORY — DX: Diverticulitis of intestine, part unspecified, without perforation or abscess without bleeding: K57.92

## 2021-08-12 LAB — C DIFFICILE QUICK SCREEN W PCR REFLEX
C Diff antigen: NEGATIVE
C Diff interpretation: NOT DETECTED
C Diff toxin: NEGATIVE

## 2021-08-12 LAB — URINALYSIS, ROUTINE W REFLEX MICROSCOPIC
Bilirubin Urine: NEGATIVE
Glucose, UA: NEGATIVE mg/dL
Hgb urine dipstick: NEGATIVE
Ketones, ur: NEGATIVE mg/dL
Nitrite: NEGATIVE
Protein, ur: NEGATIVE mg/dL
Specific Gravity, Urine: 1.024 (ref 1.005–1.030)
pH: 5 (ref 5.0–8.0)

## 2021-08-12 LAB — COMPREHENSIVE METABOLIC PANEL
ALT: 15 U/L (ref 0–44)
AST: 14 U/L — ABNORMAL LOW (ref 15–41)
Albumin: 3.8 g/dL (ref 3.5–5.0)
Alkaline Phosphatase: 66 U/L (ref 38–126)
Anion gap: 8 (ref 5–15)
BUN: 9 mg/dL (ref 6–20)
CO2: 19 mmol/L — ABNORMAL LOW (ref 22–32)
Calcium: 8.6 mg/dL — ABNORMAL LOW (ref 8.9–10.3)
Chloride: 112 mmol/L — ABNORMAL HIGH (ref 98–111)
Creatinine, Ser: 0.58 mg/dL (ref 0.44–1.00)
GFR, Estimated: >60 mL/min (ref >60)
Glucose, Bld: 100 mg/dL — ABNORMAL HIGH (ref 70–99)
Potassium: 3.3 mmol/L — ABNORMAL LOW (ref 3.5–5.1)
Sodium: 139 mmol/L (ref 135–145)
Total Bilirubin: 0.7 mg/dL (ref 0.3–1.2)
Total Protein: 7.1 g/dL (ref 6.5–8.1)

## 2021-08-12 LAB — CBC
HCT: 45.4 % (ref 36.0–46.0)
Hemoglobin: 16.1 g/dL — ABNORMAL HIGH (ref 12.0–15.0)
MCH: 29.1 pg (ref 26.0–34.0)
MCHC: 35.5 g/dL (ref 30.0–36.0)
MCV: 81.9 fL (ref 80.0–100.0)
Platelets: 251 10*3/uL (ref 150–400)
RBC: 5.54 MIL/uL — ABNORMAL HIGH (ref 3.87–5.11)
RDW: 12.4 % (ref 11.5–15.5)
WBC: 13.4 10*3/uL — ABNORMAL HIGH (ref 4.0–10.5)
nRBC: 0 % (ref 0.0–0.2)

## 2021-08-12 LAB — I-STAT BETA HCG BLOOD, ED (MC, WL, AP ONLY): I-stat hCG, quantitative: <5 m[IU]/mL (ref <5)

## 2021-08-12 LAB — LIPASE, BLOOD: Lipase: 34 U/L (ref 11–51)

## 2021-08-12 MED ORDER — ONDANSETRON HCL 4 MG/2ML IJ SOLN
4.0000 mg | Freq: Once | INTRAMUSCULAR | Status: AC
Start: 2021-08-12 — End: 2021-08-12
  Administered 2021-08-12: 4 mg via INTRAVENOUS
  Filled 2021-08-12: qty 2

## 2021-08-12 MED ORDER — LACTATED RINGERS IV BOLUS
1000.0000 mL | Freq: Once | INTRAVENOUS | Status: AC
  Administered 2021-08-12: 1000 mL via INTRAVENOUS

## 2021-08-12 MED ORDER — SODIUM CHLORIDE (PF) 0.9 % IJ SOLN
INTRAMUSCULAR | Status: AC
  Filled 2021-08-12: qty 50

## 2021-08-12 MED ORDER — SODIUM CHLORIDE 0.9 % IV SOLN
500.0000 mg | INTRAVENOUS | Status: DC
  Administered 2021-08-12: 500 mg via INTRAVENOUS
  Filled 2021-08-12: qty 5

## 2021-08-12 MED ORDER — CEFDINIR 300 MG PO CAPS: 300.0000 mg | ORAL_CAPSULE | Freq: Two times a day (BID) | ORAL | 0 refills | Status: DC

## 2021-08-12 MED ORDER — METRONIDAZOLE 500 MG PO TABS: 500.0000 mg | ORAL_TABLET | Freq: Two times a day (BID) | ORAL | 0 refills | Status: DC

## 2021-08-12 MED ORDER — SODIUM CHLORIDE 0.9 % IV SOLN
1.0000 g | Freq: Once | INTRAVENOUS | Status: AC
  Administered 2021-08-12: 1 g via INTRAVENOUS
  Filled 2021-08-12: qty 10

## 2021-08-12 MED ORDER — IOHEXOL 300 MG/ML  SOLN
100.0000 mL | Freq: Once | INTRAMUSCULAR | Status: AC | PRN
  Administered 2021-08-12: 100 mL via INTRAVENOUS

## 2021-08-12 MED ORDER — LACTATED RINGERS IV BOLUS
1000.0000 mL | Freq: Once | INTRAVENOUS | Status: AC
Start: 2021-08-12 — End: 2021-08-12
  Administered 2021-08-12: 1000 mL via INTRAVENOUS

## 2021-08-12 NOTE — Telephone Encounter (Signed)
?  Patient has gone to the emergency room. CT is pending.  ?

## 2021-08-12 NOTE — Telephone Encounter (Signed)
Patient called and is extreme pain.  She thinks she is having a diverticulitis flare-up and has gone to the bathroom five times this morning.  Please call asap and advise.  Thank you. ?

## 2021-08-12 NOTE — Telephone Encounter (Signed)
-----   Message from Unk Lightning, [place] sent at 08/12/2021  3:42 PM EDT ----- ?Regarding: appointment ?Patient needs appointment with an app within the next 2 weeks.  She is currently in the ER with recurrent ileitis and she is going to be started on antibiotics.  They are going to try and get stool studies prior to her leaving.  It does not need to be with me, it can be with anyone, I think she saw Amy in the past?  Can go on a 130 slot if needed. ? ?Thanks, JLL ? ?

## 2021-08-12 NOTE — Discharge Instructions (Addendum)
Please take antibiotics as prescribed ?GI will call and schedule follow-up ointment ?Drink Gatorade mixed half-and-half with water-avoid any red or pink color. ?

## 2021-08-12 NOTE — ED Provider Notes (Signed)
?[facility] DEPT ?Provider Note ? ? ?CSN: [id number] ?Arrival date & time: 08/12/21  1006 ? ?  ? ?History ? ?Chief Complaint  ?Patient presents with  ? Abdominal Pain  ? Diarrhea  ? ? ?Abigail James is a 36 y.o. female. ? ?HPI 36 yo female complaining of watery diarrhea and abdominal pain began today at 330 woke from sleep.   ?History of diverticulitis.  She was scheduled to have repeat ct in December but was ill and did not have. Colonoscopy by Abigail James. [facility] Moros 2021. ?Patient has nuva ring ? ? ? ?  ? ?Home Medications ?Prior to Admission medications   ?Medication Sig Start Date End Date Taking? Authorizing Provider  ?benzonatate (TESSALON) 200 MG capsule Take 1 capsule (200 mg total) by mouth 3 (three) times daily as needed for cough. 03/21/21   Abigail James  ?dicyclomine (BENTYL) 10 MG capsule Take 1 tablet 2-3 times daily as needed for abdominal pain/bloating. 03/08/21   Abigail James  ?etonogestrel-ethinyl estradiol (NUVARING) 0.12-0.015 MG/24HR vaginal ring INSERT 1 RING VAGINALLY AS DIRECTED. REMOVE AFTER 3 WEEKS & WAIT 7 DAYS BEFORE INSERTING A NEW RING 12/21/20   Abigail James  ?Multiple Vitamins-Minerals (MULTIVITAMIN WOMEN PO) Take 1 tablet by mouth daily.    Abigail James  ?naproxen (NAPROSYN) 500 MG tablet Take 1 tablet (500 mg total) by mouth 2 (two) times daily as needed for moderate pain. 03/21/21   Abigail James  ?omeprazole (PRILOSEC) 20 MG capsule Take 20 mg by mouth daily.    Abigail James  ?Potassium Chloride ER 20 MEQ TBCR Take 20 mEq by mouth 2 (two) times daily for 3 days. 03/11/21 03/14/21  Abigail James  ?potassium chloride SA (KLOR-CON M) 20 MEQ tablet Take 1 tablet (20 mEq total) by mouth daily. 03/21/21   Abigail James  ?   ? ?Allergies    ?Ciprofloxacin, Isosorbide, Latex, Vancomycin, Amoxicillin, and Sulfa antibiotics   ? ?Review of Systems   ?Review of  Systems ? ?Physical Exam ?Updated Vital Signs ?BP (!) 132/91   Pulse 93   Temp 98.2 ?F (36.8 ?C) (Oral)   Resp 19   Ht 1.651 m (5' 5" )   Wt 79.4 kg   SpO2 96%   BMI 29.12 kg/m?  ?Physical Exam ? ?ED Results / Procedures / Treatments   ?Labs ?(all labs ordered are listed, but only abnormal results are displayed) ?Labs Reviewed  ?COMPREHENSIVE METABOLIC PANEL - Abnormal; Notable for the following components:  ?    Result Value  ? Potassium 3.3 (*)   ? Chloride 112 (*)   ? CO2 19 (*)   ? Glucose, Bld 100 (*)   ? Calcium 8.6 (*)   ? AST 14 (*)   ? All other components within normal limits  ?CBC - Abnormal; Notable for the following components:  ? WBC 13.4 (*)   ? RBC 5.54 (*)   ? Hemoglobin 16.1 (*)   ? All other components within normal limits  ?URINALYSIS, ROUTINE W REFLEX MICROSCOPIC - Abnormal; Notable for the following components:  ? APPearance HAZY (*)   ? Leukocytes,Ua MODERATE (*)   ? Bacteria, UA RARE (*)   ? All other components within normal limits  ?GASTROINTESTINAL PANEL BY PCR, STOOL (REPLACES STOOL CULTURE)  ?C DIFFICILE QUICK SCREEN W PCR REFLEX    ?LIPASE, BLOOD  ?I-STAT BETA HCG BLOOD, ED ([facility], [facility], [facility] ONLY)  ? ? ?EKG ?  None ? ?Radiology ?No results found. ? ?Procedures ?Procedures  ? ? ?Medications Ordered in ED ?Medications  ?cefTRIAXone (ROCEPHIN) 1 g in sodium chloride 0.9 % 100 mL IVPB (has no administration in time range)  ?azithromycin (ZITHROMAX) 500 mg in sodium chloride 0.9 % 250 mL IVPB (has no administration in time range)  ?lactated ringers bolus 1,000 mL (has no administration in time range)  ?lactated ringers bolus 1,000 mL (1,000 mLs Intravenous New Bag/Given 08/12/21 1251)  ?ondansetron [facility]) injection 4 mg (4 mg Intravenous Given 08/12/21 1251)  ?iohexol (OMNIPAQUE) 300 MG/ML solution 100 mL (100 mLs Intravenous Contrast Given 08/12/21 1402)  ?sodium chloride (PF) 0.9 % injection (  Given by Other 08/12/21 1418)  ? ? ?ED Course/ Medical Decision Making/ A&P ?Clinical Course as of  08/12/21 1548  ?Mon Aug 12, 2021  ?1152 CBC(!) ?CBC reviewed interpreted and significant for leukocytosis at 13,400 [Abigail James]  ?1152 Lipase, blood ?Lipase reviewed interpreted and within normal limits [Abigail James]  ?1152 I-Stat beta hCG blood, ED ?I-STAT beta-hCG reviewed interpreted and not consistent with pregnancy [Abigail James]  ?1152 Comprehensive metabolic panel(!) ?Complete metabolic panel reviewed interpreted significant for mild hypokalemia 3.3 ?CO2 decreased at 19 ?Chloride elevated at 112 ?LFTs, alk phos, and bili essentially within normal limits [Abigail James]  ?[place]: ?1. Mild to moderate mural stratification of distal small bowel loops ?with mild perienteric stranding about distal small bowel loops. ?Findings of enteritis or recurrent though not as severe as on ?previous imaging, not currently associated with ascites, obstruction ?or pneumatosis. This raise the question of underlying inflammatory ?bowel disease. Infectious enteritis or drug induced enteritis are ?also considered. James consultation may be warranted given recurrence ?of enteritis with similar distribution. ?2. Post cholecystectomy with near complete resolution of mild ?intrahepatic biliary duct distension seen on previous imaging. ?3. Post appendectomy. ? ? [Abigail James]  ?  ?Clinical Course User Index ?[Abigail James] Pattricia Boss, James  ? ?                        ?Medical Decision Making ?36  yo female with history of colitis with ct in 2021 negative for Crohns, now with return of symptoms with diarrhea.  IV fluids infused. ?Labs and ct obtained ?Leukocytosis, mild hypokalemia ?CT reviewed and discussed with Abigail James on call, [name] ?Plan rocephin and zithromax ?PO challenge ?Likely d/c after fluids on cipro and flagyl ? ? ?Amount and/or Complexity of Data Reviewed ?Labs: ordered. Decision-making details documented in ED Course. ?Radiology: ordered and independent interpretation performed. Decision-making details documented in ED Course. ? ?Risk ?Prescription drug  management. ? ? ? ? ? ? ? ? ? ? ?Final Clinical Impression(s) / ED Diagnoses ?Final diagnoses:  ?Diarrhea of presumed infectious origin  ?Enteritis  ? ? ?Rx / DC Orders ?ED Discharge Orders   ? ? None  ? ?  ? ? ?  ?Pattricia Boss, James ?08/12/21 1548 ? ?

## 2021-08-12 NOTE — Telephone Encounter (Signed)
Follow up scheduled with Mike Gip, PA by [facility], RN. ?

## 2021-08-12 NOTE — ED Provider Notes (Signed)
Prescriptions cefdinir and Flagyl to treat diagnoses made by prior provider. ?  ?Lorre Nick, MD ?08/12/21 1756 ? ?

## 2021-08-12 NOTE — Telephone Encounter (Signed)
Patient has been scheduled for an appt with Nicoletta Ba, PA-C on Thursday, 08/22/21 at 1:30 pm.  ? ?Appt information sent to patient via MyChart and a letter has been mailed.  ?

## 2021-08-12 NOTE — ED Triage Notes (Signed)
Patient c/o RUQ abdominal pain and diarrhea. Patient reports a history of diverticulitis ?

## 2021-08-13 ENCOUNTER — Telehealth: Payer: Self-pay

## 2021-08-13 LAB — GASTROINTESTINAL PANEL BY PCR, STOOL (REPLACES STOOL CULTURE)

## 2021-08-13 NOTE — Telephone Encounter (Signed)
Patient seen in the ER yesterday due to diarrhea and was given antibiotics. She is scheduled to follow up with Amy. She is out of the office this week, could you advise on something for nausea. It looks like she was given a shot of Zofran yesterday. ?

## 2021-08-13 NOTE — Telephone Encounter (Signed)
Transition Care Management Unsuccessful Follow-up Telephone Call ? ?Date of discharge and from where:  08/12/2021-[facility] ? ?Attempts:  1st Attempt ? ?Reason for unsuccessful TCM follow-up call:  Left voice message ? ?  ?

## 2021-08-13 NOTE — Telephone Encounter (Signed)
We received a call from patient. Per patient, was just in the ED the staff scheduled an OV for patient 08/22/21. Per patient, can we prescribe anything for nausea? Please advise ?

## 2021-08-13 NOTE — Telephone Encounter (Signed)
Yes, can you please call her in Zofran 4mg  ODT - 1-2 tabs every 6 hours PRN #20. Thanks ?

## 2021-08-14 MED ORDER — ONDANSETRON 4 MG PO TBDP
4.0000 mg | ORAL_TABLET | Freq: Four times a day (QID) | ORAL | 0 refills | Status: DC | PRN
Start: 2021-08-14 — End: 2022-02-28

## 2021-08-14 NOTE — Addendum Note (Signed)
Addended by: Arville Care on: 08/14/2021 11:11 AM ? ? Modules accepted: Orders ? ?

## 2021-08-14 NOTE — Telephone Encounter (Signed)
Zofran sent in this morning. Tried to call patient received busy signal. ?

## 2021-08-14 NOTE — Telephone Encounter (Signed)
Transition Care Management Unsuccessful Follow-up Telephone Call ? ?Date of discharge and from where:  08/12/2021-[facility] ? ?Attempts:  2nd Attempt ? ?Reason for unsuccessful TCM follow-up call:  Left voice message ? ?  ?

## 2021-08-14 NOTE — Telephone Encounter (Signed)
Left message to let patient know that script was sent in the morning. ?

## 2021-08-16 NOTE — Telephone Encounter (Signed)
Transition Care Management Unsuccessful Follow-up Telephone Call  Date of discharge and from where:  08/12/2021-[facility]  Attempts:  3rd Attempt  Reason for unsuccessful TCM follow-up call:  Unable to reach patient

## 2021-08-22 ENCOUNTER — Other Ambulatory Visit: Payer: 59

## 2021-08-22 ENCOUNTER — Encounter: Payer: Self-pay | Admitting: Physician Assistant

## 2021-08-22 ENCOUNTER — Other Ambulatory Visit (INDEPENDENT_AMBULATORY_CARE_PROVIDER_SITE_OTHER): Payer: 59

## 2021-08-22 ENCOUNTER — Ambulatory Visit (INDEPENDENT_AMBULATORY_CARE_PROVIDER_SITE_OTHER): Payer: 59 | Admitting: Physician Assistant

## 2021-08-22 VITALS — BP 130/80 | HR 76 | Ht 65.0 in | Wt 172.4 lb

## 2021-08-22 DIAGNOSIS — R197 Diarrhea, unspecified: Secondary | ICD-10-CM

## 2021-08-22 DIAGNOSIS — K529 Noninfective gastroenteritis and colitis, unspecified: Secondary | ICD-10-CM

## 2021-08-22 DIAGNOSIS — R110 Nausea: Secondary | ICD-10-CM

## 2021-08-22 DIAGNOSIS — R109 Unspecified abdominal pain: Secondary | ICD-10-CM

## 2021-08-22 LAB — CBC WITH DIFFERENTIAL/PLATELET
Basophils Absolute: 0.1 10*3/uL (ref 0.0–0.1)
Basophils Relative: 0.8 % (ref 0.0–3.0)
Eosinophils Absolute: 0.3 10*3/uL (ref 0.0–0.7)
Eosinophils Relative: 3.2 % (ref 0.0–5.0)
HCT: 46.2 % — ABNORMAL HIGH (ref 36.0–46.0)
Hemoglobin: 15.7 g/dL — ABNORMAL HIGH (ref 12.0–15.0)
Lymphocytes Relative: 29.7 % (ref 12.0–46.0)
Lymphs Abs: 2.6 10*3/uL (ref 0.7–4.0)
MCHC: 34.1 g/dL (ref 30.0–36.0)
MCV: 82.8 fl (ref 78.0–100.0)
Monocytes Absolute: 0.6 10*3/uL (ref 0.1–1.0)
Monocytes Relative: 6.4 % (ref 3.0–12.0)
Neutro Abs: 5.2 10*3/uL (ref 1.4–7.7)
Neutrophils Relative %: 59.9 % (ref 43.0–77.0)
Platelets: 289 10*3/uL (ref 150.0–400.0)
RBC: 5.58 Mil/uL — ABNORMAL HIGH (ref 3.87–5.11)
RDW: 12.7 % (ref 11.5–15.5)
WBC: 8.6 10*3/uL (ref 4.0–10.5)

## 2021-08-22 LAB — URINALYSIS WITH CULTURE, IF INDICATED
Bilirubin Urine: NEGATIVE
Ketones, ur: NEGATIVE
Nitrite: NEGATIVE
Specific Gravity, Urine: <=1.005 — AB (ref 1.000–1.030)
Total Protein, Urine: NEGATIVE
Urine Glucose: NEGATIVE
Urobilinogen, UA: 0.2 (ref 0.0–1.0)
pH: 6 (ref 5.0–8.0)

## 2021-08-22 LAB — C-REACTIVE PROTEIN: CRP: <1 mg/dL (ref 0.5–20.0)

## 2021-08-22 LAB — SEDIMENTATION RATE: Sed Rate: 11 mm/hr (ref 0–20)

## 2021-08-22 MED ORDER — DICYCLOMINE HCL 10 MG PO CAPS
10.0000 mg | ORAL_CAPSULE | Freq: Three times a day (TID) | ORAL | 4 refills | Status: DC | PRN
Start: 2021-08-22 — End: 2023-09-30

## 2021-08-22 NOTE — Progress Notes (Unsigned)
Subjective:    Patient ID: Abigail James, female    DOB: December 12, 1985, 36 y.o.   MRN: [id number]  HPI [name] is a pleasant 36 year old white female, known to Dr. Adela Lank, and myself.  She was last seen here in December 2022, and comes in today after another episode of acute GI symptoms culminating in ER visit. When she was seen in December she had had a 3-week history of nausea and 1 week history of diarrhea which had resolved by the time she was seen.  She had prior history of ileitis on imaging in 2020 however when she had colonoscopy in 2021 the terminal ileum and colon all appeared normal, no biopsies done. She says over the past couple of months she had been having some gassiness and nausea and just not feeling well in general, having occasional mild diarrhea. Last week she had acute worsening of symptoms stating that she woke up at 3 AM with severe cramping like labor pains, then started having diarrhea which was watery and occurring about every 10 minutes.  She had some nausea and vomiting which she thinks was related to the pain, no fever.  Her diarrhea lasted a total of 2 days and is now having soft bowel movements.  She is still having abdominal cramping and discomfort but not as bad as it had been.  At the time of ER visit on 08/12/2021 she had CT of the abdomen and pelvis which showed mild to moderate mural stratification of the distal small bowel loops, and mild perinephric stranding, felt to have an enteritis though not as severe as on prior imaging question inflammatory versus infectious. WBC was elevated at 13.4/hemoglobin 16/hematocrit 45.1 C. difficile quick screen negative and path panel negative. She did have a positive UA but not culture and was treated with a course of Omnicef and metronidazole.   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  Review of Systems Pertinent positive and negative review of systems were noted in the above HPI section.  All other review of systems was otherwise negative.   Outpatient Encounter Medications as of 08/22/2021  Medication Sig   etonogestrel-ethinyl estradiol (NUVARING) 0.12-0.015 MG/24HR vaginal ring INSERT 1 RING VAGINALLY AS DIRECTED. REMOVE AFTER 3 WEEKS & WAIT 7 DAYS BEFORE INSERTING A NEW RING   Multiple Vitamins-Minerals (MULTIVITAMIN WOMEN PO) Take 1 tablet by mouth daily.   naproxen (NAPROSYN) 500 MG tablet Take 1 tablet (500 mg total) by mouth 2 (two) times daily as needed for moderate pain.   omeprazole (PRILOSEC) 20 MG capsule Take 20 mg by mouth daily.   ondansetron (ZOFRAN-ODT) 4 MG disintegrating tablet Take 1 tablet (4 mg total) by mouth every 6 (six) hours as needed for nausea or vomiting. (Patient taking differently: Take 4 mg by mouth as needed for nausea or vomiting.)   [DISCONTINUED] dicyclomine (BENTYL) 10 MG capsule Take 1 tablet 2-3 times daily as needed for abdominal pain/bloating.   dicyclomine (BENTYL) 10 MG capsule Take 1 capsule (10 mg total) by mouth 3 (three) times daily as needed for spasms. Take 1 tablet 2-3 times daily as needed for abdominal pain/bloating.   [DISCONTINUED] benzonatate (TESSALON) 200 MG capsule Take 1 capsule (200 mg total) by mouth 3 (three) times daily as needed for cough.   [DISCONTINUED] cefdinir (OMNICEF) 300 MG capsule Take 1 capsule (300 mg total) by mouth 2 (two) times daily.   [  DISCONTINUED] metroNIDAZOLE (FLAGYL) 500 MG tablet Take 1 tablet (500 mg total) by mouth 2 (two) times daily.   [DISCONTINUED] Potassium Chloride ER 20 MEQ TBCR Take 20 mEq by mouth 2 (two) times daily for 3 days.   [DISCONTINUED] potassium chloride SA (KLOR-CON M) 20 MEQ tablet Take 1 tablet (20 mEq total) by mouth daily.   No facility-administered encounter medications on file as of 08/22/2021.   Allergies  Allergen Reactions   Ciprofloxacin  Anaphylaxis   Isosorbide Other (See Comments)    Patient has terrible headaches and bodyaches on Imdur. Other reaction(s): Other (See Comments) Patient has terrible headaches and bodyaches on Imdur. Patient has terrible headaches and bodyaches on Imdur.   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   Social History   Socioeconomic History   Marital status: Single    Spouse name: Not on file   Number of children: 3   Years of education: Not on file   Highest education level: Not on file  Occupational History   Not on file  Tobacco Use   Smoking status: Former    Types: Cigarettes    Quit date: 04/08/2014    Years since quitting: 7.3   Smokeless tobacco: Never  Vaping Use   Vaping Use: Never used  Substance and Sexual Activity   Alcohol use: Not Currently   Drug use: No   Sexual activity: Yes    Birth control/protection: Other-see comments  Other Topics Concern   Not on file  Social History Narrative   ** Merged History Encounter **       Social Determinants of Health   Financial Resource Strain: Not on file  Food Insecurity: Not on file  Transportation Needs: Not on file  Physical Activity: Not on file  Stress: Not on file  Social Connections: Not on file  Intimate Partner Violence: Not on file    Ms. Trampe's family history includes Cancer in her maternal grandmother and paternal grandmother; Diabetes in her paternal grandmother; Hypertension in her brother, father, maternal uncle, and paternal grandmother; Suicidality in her brother.      Objective:    Vitals:   08/22/21 1319  BP: 130/80  Pulse: 76    Physical Exam Well-developed  well-nourished white female in no acute distress.  Height, Weight, 172 BMI 28.6  HEENT; nontraumatic normocephalic, EOMI, PE R LA, sclera anicteric. Oropharynx; not examined today Neck; supple, no JVD Cardiovascular; regular rate and rhythm with S1-S2, no murmur rub or gallop Pulmonary; Clear bilaterally Abdomen; soft, there is some mild rather diffuse tenderness, more tender in the right mid quadrant no rebound or guarding, nondistended, no palpable mass or hepatosplenomegaly, bowel sounds are active Rectal; not done today Skin; benign exam, no jaundice rash or appreciable lesions Extremities; no clubbing cyanosis or edema skin warm and dry Neuro/Psych; alert and oriented x4, grossly nonfocal mood and affect appropriate        Assessment & Plan:   #63 36 year old white female with acute illness onset 08/12/2021 with severe abdominal pain/cramping, numerous episodes of watery diarrhea and nausea vomiting. CT of the abdomen pelvis at the time of ER visit showed ileitis with mild to moderate mural stratification  of the distal small bowel loops, also noted some mild perinephric stranding. GI path panel was done and negative and C. difficile quick screen negative.  She did have associated leukocytosis and hemoglobin 16. Also had a positive UA, no culture done, he is completing a course of Omnicef and metronidazole for both situations.  She is definitely significantly improved though still having some abdominal cramping and discomfort and has been eating very light.  Also complaining of bilateral mid back discomfort  Interesting that patient had an episode in December with nausea and diarrhea that lasted for couple of weeks and had prior imaging December 2020 with right lower quadrant pain and finding of ileitis.  Subsequent colonoscopy the following month showed a normal-appearing terminal ileum and colon with no evidence for IBD or other inflammatory changes.  #2 prior history of PE #3.   Cryofibrinogenemia #4 status postcholecystectomy and appendectomy  Plan patient completes antibiotics today Start Bentyl 10 mg p.o. 3 times daily as needed for abdominal pain/cramping CBC with differential, repeat UA, sed rate, CRP , ANA and fecal calprotectin Soft bland diet with gradual advancement Will await above lab studies, she may require repeat colonoscopy which she is reluctant to do at present.  If the symptoms completely resolve I think we can hold but if she has any persistence of symptoms over the next weeks then this will need to be reconsidered.  Evie Croston Oswald HillockS Tajon Moring PA-C 08/22/2021   Cc: Lesia Sagoarter, Sarah Margaret,*

## 2021-08-22 NOTE — Patient Instructions (Addendum)
If you are age 37 or younger, your body mass index should be between 19-25. Your Body mass index is 28.68 kg/m. If this is out of the aformentioned range listed, please consider follow up with your Primary Care Provider.  ________________________________________________________  The [facility] providers would like to encourage you to use [facility] to communicate with providers for non-urgent requests or questions.  Due to long hold times on the telephone, sending your provider a message by [facility] may be a faster and more efficient way to get a response.  Please allow 48 business hours for a response.  Please remember that this is for non-urgent requests.  _______________________________________________________  Your provider has requested that you go to the basement level for lab work before leaving today. Press "B" on the elevator. The lab is located at the first door on the left as you exit the elevator.  Continue Dicyclomine three times daily as needed.  Continue OTC Omeprazole 20 mg 1 capsule every morning.  Follow a bland diet.  Follow up as needed.  Thank you for entrusting me with your care and choosing [facility].  Amy Esterwood, PA-C

## 2021-08-23 ENCOUNTER — Telehealth: Payer: Self-pay | Admitting: Physician Assistant

## 2021-08-23 DIAGNOSIS — F329 Major depressive disorder, single episode, unspecified: Secondary | ICD-10-CM | POA: Diagnosis not present

## 2021-08-23 DIAGNOSIS — Z6828 Body mass index (BMI) 28.0-28.9, adult: Secondary | ICD-10-CM | POA: Diagnosis not present

## 2021-08-23 DIAGNOSIS — F419 Anxiety disorder, unspecified: Secondary | ICD-10-CM | POA: Diagnosis not present

## 2021-08-23 DIAGNOSIS — F32A Depression, unspecified: Secondary | ICD-10-CM | POA: Diagnosis not present

## 2021-08-23 DIAGNOSIS — F5102 Adjustment insomnia: Secondary | ICD-10-CM | POA: Diagnosis not present

## 2021-08-23 DIAGNOSIS — R030 Elevated blood-pressure reading, without diagnosis of hypertension: Secondary | ICD-10-CM | POA: Diagnosis not present

## 2021-08-23 LAB — URINE CULTURE
MICRO NUMBER:: 13445277
Result:: NO GROWTH
SPECIMEN QUALITY:: ADEQUATE

## 2021-08-23 NOTE — Telephone Encounter (Signed)
Called the patient. Advised a culture had been planted and was pending. She is encouraged to push PO water.

## 2021-08-23 NOTE — Telephone Encounter (Signed)
Patient called stating she got the results of the urinalysis that was ordered for her and there is "all kinds of bacteria" in it and she wanted to know if you were going to send in a prescription to her pharmacy for her so she can have it before the weekend.  Please call and advise.  Thank you.

## 2021-08-23 NOTE — Progress Notes (Signed)
Agree with assessment and plan as outlined.  Await lab work intercourse over the next week or 2.  If persistent symptoms then she needs additional evaluation of her bowel.  Would consider pursuing capsule endoscopy if no obstructive symptoms versus repeat colonoscopy.  Thanks.  Amy can you ensure she follows up with [place] in about 1 month or so, and call sooner if any questions or concerns..  Thanks

## 2021-08-24 LAB — ANTI-NUCLEAR AB-TITER (ANA TITER): ANA Titer 1: 1:80 {titer} — ABNORMAL HIGH

## 2021-08-24 LAB — ANA: Anti Nuclear Antibody (ANA): POSITIVE — AB

## 2021-08-28 ENCOUNTER — Telehealth: Payer: Self-pay

## 2021-08-28 NOTE — Telephone Encounter (Signed)
Spoke with the patient regarding the positive ANA with low titer.  Sammuel Cooper, PA-C  08/27/2021 12:13 PM EDT     Beth, sorry to send a second note on this patient, I was waiting for ANA titer.  Please let her know that her ANA is positive with a low titer I think this is likely related to her history of cryofibrinogenemia-please find out if she has a rheumatologist that she sees, I do not see anything in our system.    Patient became very angry. She said we are not listening to her and it is taking too long to get anything from [place]. States "I do not have cryofibrinogenemia. I have seen a hematologist and I don't have it." She states she feels I am not listening to her. Apologized for making her feel this way.  She does not have a rheumatologist. She states she will go with your recommendations on who to see. Patient is waiting for the response.

## 2021-08-28 NOTE — Telephone Encounter (Signed)
Spoke with the patient.  She states "as long as I don't eat regular food, I am sort of okay." Tried to eat a hamburger and "a lot of pooping." She feels the dicyclomine helps her to have less gas. Zofran is helping control her nausea. Still has "aching" abdomen. She states she does not have time to do a colonoscopy right now. She emphasizes she is "freaking out because I have a positive ANA" and "I want to know what she is going to do about that. Does she want to repeat it?"

## 2021-08-28 NOTE — Telephone Encounter (Signed)
Spoke with the patient. She is wanting to talk with Amy.  She wants the following information relayed. She does not want to come back. She wants to see a rheumatologist. She does not understand how it went from being referred to rheumatology to having to come back in to see GI again.  "You are not listening to me. I have to wait for return calls and had to go to the ER because no one called me back in time. I think I will go back to my PCP and get a referral. I may need to see another GI. I need to see a Rheumatologist. My ANA is elevated. I could have Lupus or something. I have 4 kids here I have to worry about. How did it go from you seeming to care to this. I want to talk to Amy."  I asked what I could do to help her right now. She responds she wants to talk to Amy.

## 2021-08-29 DIAGNOSIS — F419 Anxiety disorder, unspecified: Secondary | ICD-10-CM | POA: Diagnosis not present

## 2021-08-29 DIAGNOSIS — R7989 Other specified abnormal findings of blood chemistry: Secondary | ICD-10-CM | POA: Diagnosis not present

## 2021-08-29 DIAGNOSIS — R768 Other specified abnormal immunological findings in serum: Secondary | ICD-10-CM | POA: Diagnosis not present

## 2021-08-29 DIAGNOSIS — F4321 Adjustment disorder with depressed mood: Secondary | ICD-10-CM | POA: Diagnosis not present

## 2021-08-29 DIAGNOSIS — E876 Hypokalemia: Secondary | ICD-10-CM | POA: Diagnosis not present

## 2021-08-30 ENCOUNTER — Other Ambulatory Visit: Payer: Self-pay

## 2021-08-30 DIAGNOSIS — R768 Other specified abnormal immunological findings in serum: Secondary | ICD-10-CM

## 2021-08-30 NOTE — Telephone Encounter (Signed)
Records faxed to [facility]. Patient is scheduled but stated she did not want the appointment.

## 2021-09-03 ENCOUNTER — Telehealth: Payer: Self-pay | Admitting: Hematology

## 2021-09-03 ENCOUNTER — Inpatient Hospital Stay: Payer: 59 | Attending: Hematology | Admitting: Hematology

## 2021-09-03 ENCOUNTER — Other Ambulatory Visit: Payer: Self-pay

## 2021-09-03 ENCOUNTER — Encounter: Payer: Self-pay | Admitting: Gastroenterology

## 2021-09-03 VITALS — BP 142/97 | HR 76 | Temp 98.6°F | Resp 18 | Ht 65.0 in | Wt 172.1 lb

## 2021-09-03 DIAGNOSIS — Z86718 Personal history of other venous thrombosis and embolism: Secondary | ICD-10-CM | POA: Insufficient documentation

## 2021-09-03 DIAGNOSIS — D892 Hypergammaglobulinemia, unspecified: Secondary | ICD-10-CM | POA: Diagnosis present

## 2021-09-03 DIAGNOSIS — Z793 Long term (current) use of hormonal contraceptives: Secondary | ICD-10-CM | POA: Insufficient documentation

## 2021-09-03 DIAGNOSIS — Z79899 Other long term (current) drug therapy: Secondary | ICD-10-CM | POA: Insufficient documentation

## 2021-09-03 DIAGNOSIS — Z87891 Personal history of nicotine dependence: Secondary | ICD-10-CM | POA: Diagnosis not present

## 2021-09-03 DIAGNOSIS — Z86711 Personal history of pulmonary embolism: Secondary | ICD-10-CM | POA: Insufficient documentation

## 2021-09-03 DIAGNOSIS — D751 Secondary polycythemia: Secondary | ICD-10-CM | POA: Diagnosis not present

## 2021-09-03 NOTE — Telephone Encounter (Signed)
Scheduled follow-up appointment per 6/6 los. Patient is aware. 

## 2021-09-04 ENCOUNTER — Inpatient Hospital Stay: Payer: 59

## 2021-09-04 DIAGNOSIS — D892 Hypergammaglobulinemia, unspecified: Secondary | ICD-10-CM | POA: Diagnosis not present

## 2021-09-04 DIAGNOSIS — D751 Secondary polycythemia: Secondary | ICD-10-CM

## 2021-09-04 LAB — CBC WITH DIFFERENTIAL (CANCER CENTER ONLY)
Abs Immature Granulocytes: 0.02 10*3/uL (ref 0.00–0.07)
Basophils Absolute: 0 10*3/uL (ref 0.0–0.1)
Basophils Relative: 0 %
Eosinophils Absolute: 0.2 10*3/uL (ref 0.0–0.5)
Eosinophils Relative: 3 %
HCT: 44.4 % (ref 36.0–46.0)
Hemoglobin: 15.6 g/dL — ABNORMAL HIGH (ref 12.0–15.0)
Immature Granulocytes: 0 %
Lymphocytes Relative: 28 %
Lymphs Abs: 1.9 10*3/uL (ref 0.7–4.0)
MCH: 28.2 pg (ref 26.0–34.0)
MCHC: 35.1 g/dL (ref 30.0–36.0)
MCV: 80.3 fL (ref 80.0–100.0)
Monocytes Absolute: 0.5 10*3/uL (ref 0.1–1.0)
Monocytes Relative: 8 %
Neutro Abs: 4.1 10*3/uL (ref 1.7–7.7)
Neutrophils Relative %: 61 %
Platelet Count: 267 10*3/uL (ref 150–400)
RBC: 5.53 MIL/uL — ABNORMAL HIGH (ref 3.87–5.11)
RDW: 12.3 % (ref 11.5–15.5)
WBC Count: 6.8 10*3/uL (ref 4.0–10.5)
nRBC: 0 % (ref 0.0–0.2)

## 2021-09-06 ENCOUNTER — Telehealth (INDEPENDENT_AMBULATORY_CARE_PROVIDER_SITE_OTHER): Payer: 59 | Admitting: Gastroenterology

## 2021-09-06 DIAGNOSIS — R110 Nausea: Secondary | ICD-10-CM | POA: Diagnosis not present

## 2021-09-06 DIAGNOSIS — R933 Abnormal findings on diagnostic imaging of other parts of digestive tract: Secondary | ICD-10-CM | POA: Diagnosis not present

## 2021-09-06 NOTE — Progress Notes (Signed)
HPI :  Virtual Visit via Video Note  I connected with Abigail James on 09/06/21 at 11:00 AM EDT by a video enabled telemedicine application and verified that I am speaking with the correct person using two identifiers.  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 The patient expressed understanding and agreed to proceed.  Time spent on call 15 minutes   36 year old female here for a follow-up visit virtually.  She requested a virtual visit as she would not need to take time off from her job.  Recall historically she had presented with abdominal pain in December 2020, CT scan showed some ileitis.  She had a follow-up colonoscopy with me in 2021 which showed that her ileum was normal and colon was normal.  She had been feeling back to her baseline.  She has some occasional increased gas, nausea, occasional loose stools.  A few weeks ago she presented with severe cramping abdominal pain, had nausea vomiting, significant diarrhea.  She had loose stools for a few days.  She went to the ED again and had a CT scan on May 15 which showed some inflammation of her distal small bowel, mild perinephric stranding, felt to have an enteritis.  Her WBC was elevated at 13.4, hemoglobin 16.  She had a negative GI pathogen panel, negative C. difficile.  She had a positive UA and was treated with antibiotics for UTI.  She was seen by Mike Gip a few weeks ago.  Thought to have potentially infectious enteritis, versus less likely IBD.  Had a fecal calprotectin sent but not submitted, patient states this is very difficult to do for her work.  She had a follow-up CBC done, hemoglobin remains elevated at 15.7.  Her primary care referred her to hematology for polycythemia evaluation.  She has had that visit and JAK2 testing sent and pending.  She was also found to have a positive ANA  at a titer of 1-80.  She was referred to rheumatology for this, that evaluations pending, she states she had some lab work done recently but I cannot see results of that.  In general she is definitely much better than when she was in the emergency room a few weeks ago.  She states the diarrhea completely stopped.  She has occasional soft or loose stool but back to baseline there.  She does not have any abdominal pains that bother her if she is on a regular/bland diet.  If she does cheeseburger something heavy she can have some abdominal cramps.  She has had some nausea that is sporadic but no vomiting.  Overall she is feeling better in regards to her bowel symptoms.  We discussed if she needed any further work-up.  Of note she does take omeprazole daily for reflux and states it works pretty well for her.   Past Medical History:  Diagnosis Date   Anxiety    Asthma    Depression    Diverticulitis    Ileitis      Past Surgical History:  Procedure Laterality Date   APPENDECTOMY     CESAREAN SECTION N/A 12/19/2016   Procedure: CESAREAN SECTION;  Surgeon: Rader [place] Bing, MD;  Location: [facility];  Service: Obstetrics;  Laterality: N/A;   CHOLECYSTECTOMY     DILATION AND CURETTAGE OF UTERUS     FOOT SURGERY     LEG SURGERY  TYMPANOSTOMY TUBE PLACEMENT     Family History  Problem Relation Age of Onset   Hypertension Father    Hypertension Brother    Suicidality Brother    Hypertension Maternal Uncle    Cancer Maternal Grandmother    Hypertension Paternal Grandmother    Diabetes Paternal Grandmother    Cancer Paternal Grandmother    Colon cancer Neg Hx    Colon polyps Neg Hx    Esophageal cancer Neg Hx    Rectal cancer Neg Hx    Stomach cancer Neg Hx    Social History   Tobacco Use   Smoking status: Former    Types: Cigarettes    Quit date: 04/08/2014    Years since quitting: 7.4   Smokeless tobacco: Never  Vaping Use   Vaping Use: Never used  Substance Use  Topics   Alcohol use: Not Currently   Drug use: No   Current Outpatient Medications  Medication Sig Dispense Refill   dicyclomine (BENTYL) 10 MG capsule Take 1 capsule (10 mg total) by mouth 3 (three) times daily as needed for spasms. Take 1 tablet 2-3 times daily as needed for abdominal pain/bloating. 90 capsule 4   etonogestrel-ethinyl estradiol (NUVARING) 0.12-0.015 MG/24HR vaginal ring INSERT 1 RING VAGINALLY AS DIRECTED. REMOVE AFTER 3 WEEKS & WAIT 7 DAYS BEFORE INSERTING A NEW RING 3 each 3   Multiple Vitamins-Minerals (MULTIVITAMIN WOMEN PO) Take 1 tablet by mouth daily.     naproxen (NAPROSYN) 500 MG tablet Take 1 tablet (500 mg total) by mouth 2 (two) times daily as needed for moderate pain. 15 tablet 0   omeprazole (PRILOSEC) 20 MG capsule Take 20 mg by mouth daily.     ondansetron (ZOFRAN-ODT) 4 MG disintegrating tablet Take 1 tablet (4 mg total) by mouth every 6 (six) hours as needed for nausea or vomiting. (Patient taking differently: Take 4 mg by mouth as needed for nausea or vomiting.) 20 tablet 0   No current facility-administered medications for this visit.   Allergies  Allergen Reactions   Ciprofloxacin Anaphylaxis   Isosorbide Other (See Comments)    Patient has terrible headaches and bodyaches on Imdur. Other reaction(s): Other (See Comments) Patient has terrible headaches and bodyaches on Imdur. Patient has terrible headaches and bodyaches on Imdur.   Latex Swelling   Vancomycin Nausea And Vomiting   Amoxicillin Rash   Sulfa Antibiotics Swelling and Rash     Review of Systems: All systems reviewed and negative except where noted in HPI.    CT ABDOMEN PELVIS W CONTRAST  Result Date: 08/12/2021 CLINICAL DATA:  A 36 year old female presents with history of abdominal pain in the RIGHT upper quadrant and with diarrhea, history of diverticulitis in the past. EXAM: CT ABDOMEN AND PELVIS WITH CONTRAST TECHNIQUE: Multidetector CT imaging of the abdomen and pelvis was  performed using the standard protocol following bolus administration of intravenous contrast. RADIATION DOSE REDUCTION: This exam was performed according to the departmental dose-optimization program which includes automated exposure control, adjustment of the mA and/or kV according to patient size and/or use of iterative reconstruction technique. CONTRAST:  OMNIPAQUE IOHEXOL 300 MG/ML  SOLN COMPARISON:  March 30, 2019. FINDINGS: Lower chest: Lung bases are clear aside from mild basilar atelectasis. Hepatobiliary: Focal fat along the fissure for the falciform ligament. No focal, suspicious hepatic lesion. Background hepatic steatosis. Portal vein is patent. Post cholecystectomy. Minimal biliary duct dilation of intrahepatic biliary tree is improved compared to prior imaging. Pancreas: Normal, without mass, inflammation or  ductal dilatation. Spleen: Normal. Adrenals/Urinary Tract: Adrenal glands are normal. Symmetric renal enhancement without hydronephrosis. No suspicious renal lesion. No perivesical stranding. No perinephric stranding. Stomach/Bowel: Mild to moderate mural stratification of distal small bowel loops. No sign of bowel obstruction. Mild perienteric stranding about distal small bowel loops. Post appendectomy. Colon is collapsed. No pneumatosis. No no ascites. Vascular/Lymphatic: Aorta with smooth contours. IVC with smooth contours. No aneurysmal dilation of the abdominal aorta. There is no gastrohepatic or hepatoduodenal ligament lymphadenopathy. No retroperitoneal or mesenteric lymphadenopathy. No pelvic sidewall lymphadenopathy. Reproductive: Hormone ring or similar device within the vagina. No adnexal mass. Other: No ascites.  No pneumoperitoneum. Musculoskeletal: No acute or significant osseous findings. IMPRESSION: 1. Mild to moderate mural stratification of distal small bowel loops with mild perienteric stranding about distal small bowel loops. Findings of enteritis or recurrent though  not as severe as on previous imaging, not currently associated with ascites, obstruction or pneumatosis. This raise the question of underlying inflammatory bowel disease. Infectious enteritis or drug induced enteritis are also considered. GI consultation may be warranted given recurrence of enteritis with similar distribution. 2. Post cholecystectomy with near complete resolution of mild intrahepatic biliary duct distension seen on previous imaging. 3. Post appendectomy. Electronically Signed   By: Donzetta KohutGeoffrey  Wile M.D.   On: 08/12/2021 14:53    Physical Exam: LMP 08/19/2021 Comment: Nuvaring Constitutional: Pleasant female in no acute distress.   ASSESSMENT AND PLAN: 36 year old female here for reassessment of the following:  Abnormal CT scan small bowel Nausea  As above, patient with prior illness in 2020 with ileitis on CT scan which led to a colonoscopy which was normal, no evidence of Crohn's at that time.  At baseline she denies any significant bowel symptoms.  Had another acute illness with abdominal pain, diarrhea, nausea vomiting.  CT scan again showed small bowel inflammation.  She has since been recovering from that and generally doing much better and recovering as expected.  While she had a negative GI pathogen panel I think infectious enteritis still remains most likely.  Hopefully this is not IBD but time will tell.  She will continue Bentyl for now, continue Zofran for nausea.  She is taking omeprazole for reflux and it works pretty well for her, will continue that. I told her if this is an infectious enteritis it still may take some time to recover from this.  As long as she continues to improve and feels well we can continue to monitor.  If she has persistent symptoms however I asked her to contact me.  If that is the case may do a fecal calprotectin and consider a follow-up MR enterography.  We briefly discussed repeat colonoscopy versus capsule study if symptoms persist, she really  would like to avoid those if possible and would prefer follow-up imaging, as that would be much easier for her, which is reasonable.  She is going to monitor for the next 3 to 4 weeks and see how she feels.  She can let me know either way how she is doing and we can determine if we need to do MRE and fecal calprotectin.  She will otherwise continue her evaluation with hematology and rheumatology as referred by her PCP.  I spent 30 minutes of time, including in depth chart review, face-to-face time with the patient, cand documentation of this encounter.   Harlin RainSteve Taneshia Lorence, MD [facility]

## 2021-09-09 NOTE — Progress Notes (Signed)
HEMATOLOGY/ONCOLOGY CONSULTATION NOTE  Date of Service: 09/09/2021  Patient Care Team: Abigail Sago, MD as PCP - General (Internal Medicine)  CHIEF COMPLAINTS/PURPOSE OF CONSULTATION:  Polycythemia  HISTORY OF PRESENTING ILLNESS: 08/29/2020  Abigail James is a wonderful 36 y.o. female who has been referred to [place] by Abigail Borer, NP at [facility] for evaluation and management of personal hx of PE. The pt reports that she is doing well overall.  The pt reports that she was seen by Dr. Marcheta James at [facility]. The pt notes that she tried to make a f/u visit there, but due to it being more than three years she would have to see a different hematologist. The pt desired to just have care closer to home if she would need to see a new doctor.  The pt notes that starting in March and April, she has been feeling very sick. It started with chest pain and she thought it was anxiety. They did an ultrasound of her lower extremities and found no clots. She notes her feet constantly hurt and she is weak all over. She notes that she had pneumonia last month and is now being told she needs to see a Pulmonologist as well. The pt notes that as of right now she is not on any blood thinners. She went to Dr. Marcheta James who told her that she has never had any type of blood clot. The pt notes that she has never had a clot, denying that she had a clot during pregnancy and in the lung. She has three children and was taken off Lovenox with no issues. She had a hematoma during pregnancy but notes everything is fine and her son is a healthy three year old.  She notes her legs are always cold and never hurt like a clot. She has been trying to stay warm and avoid being too cold. To her recollection, the pt notes that during her last visit with Dr. Marcheta James she did not recommend any long-term blood thinners.   INTERVAL HISTORY  Abigail James has been referred back to [place] for evaluation of polycythemia.  She  was previously referred for evaluation of cryofibrinogen anemia and possible blood clots but the last time she was here she had noted that these diagnoses were uncertain.  She has previously been seen and followed by Dr. Kerry James at [facility]. The last time she was seen for this she was referred back to Abigail James but she has not followed up based on her previous referral. She has been referred back to [place] for mild polycythemia. Is very anxious and notes that she is tired of needing to see so many doctors.  Interview and physical examination was conducted with the nurse tech as chaperone. Patient had labs done today for further evaluation of her polycythemia. Her previous labs on 08/12/2021 included CBC which showed a hemoglobin of 16.1 and hematocrit of 45.4 with some mild leukocytosis of 13.4 with no differential, platelets 251k  MEDICAL HISTORY:  Past Medical History:  Diagnosis Date   Anxiety    Asthma    Depression    Diverticulitis    Ileitis     SURGICAL HISTORY: Past Surgical History:  Procedure Laterality Date   APPENDECTOMY     CESAREAN SECTION N/A 12/19/2016   Procedure: CESAREAN SECTION;  Surgeon: Abigail Bing, MD;  Location: [facility] BIRTHING SUITES;  Service: Obstetrics;  Laterality: N/A;   CHOLECYSTECTOMY     DILATION AND  CURETTAGE OF UTERUS     FOOT SURGERY     LEG SURGERY     TYMPANOSTOMY TUBE PLACEMENT      SOCIAL HISTORY: Social History   Socioeconomic History   Marital status: Single    Spouse name: Not on file   Number of children: 3   Years of education: Not on file   Highest education level: Not on file  Occupational History   Not on file  Tobacco Use   Smoking status: Former    Types: Cigarettes    Quit date: 04/08/2014    Years since quitting: 7.4   Smokeless tobacco: Never  Vaping Use   Vaping Use: Never used  Substance and Sexual Activity   Alcohol use: Not Currently   Drug use: No   Sexual activity: Yes    Birth control/protection:  Other-see comments  Other Topics Concern   Not on file  Social History Narrative   ** Merged History Encounter **       Social Determinants of Health   Financial Resource Strain: Not on file  Food Insecurity: Not on file  Transportation Needs: Not on file  Physical Activity: Not on file  Stress: Not on file  Social Connections: Not on file  Intimate Partner Violence: Not on file    FAMILY HISTORY: Family History  Problem Relation Age of Onset   Hypertension Father    Hypertension Brother    Suicidality Brother    Hypertension Maternal Uncle    Cancer Maternal Grandmother    Hypertension Paternal Grandmother    Diabetes Paternal Grandmother    Cancer Paternal Grandmother    Colon cancer Neg Hx    Colon polyps Neg Hx    Esophageal cancer Neg Hx    Rectal cancer Neg Hx    Stomach cancer Neg Hx     ALLERGIES:  is allergic to ciprofloxacin, isosorbide, latex, vancomycin, amoxicillin, and sulfa antibiotics.  MEDICATIONS:  Current Outpatient Medications  Medication Sig Dispense Refill   dicyclomine (BENTYL) 10 MG capsule Take 1 capsule (10 mg total) by mouth 3 (three) times daily as needed for spasms. Take 1 tablet 2-3 times daily as needed for abdominal pain/bloating. 90 capsule 4   etonogestrel-ethinyl estradiol (NUVARING) 0.12-0.015 MG/24HR vaginal ring INSERT 1 RING VAGINALLY AS DIRECTED. REMOVE AFTER 3 WEEKS & WAIT 7 DAYS BEFORE INSERTING A NEW RING 3 each 3   Multiple Vitamins-Minerals (MULTIVITAMIN WOMEN PO) Take 1 tablet by mouth daily.     omeprazole (PRILOSEC) 20 MG capsule Take 20 mg by mouth daily.     ondansetron (ZOFRAN-ODT) 4 MG disintegrating tablet Take 1 tablet (4 mg total) by mouth every 6 (six) hours as needed for nausea or vomiting. (Patient taking differently: Take 4 mg by mouth as needed for nausea or vomiting.) 20 tablet 0   naproxen (NAPROSYN) 500 MG tablet Take 1 tablet (500 mg total) by mouth 2 (two) times daily as needed for moderate pain. 15 tablet  0   No current facility-administered medications for this visit.    REVIEW OF SYSTEMS:   10 Point review of Systems was done is negative except as noted above.  PHYSICAL EXAMINATION: ECOG PERFORMANCE STATUS: 1 - Symptomatic but completely ambulatory  . Vitals:   09/03/21 1323  BP: (!) 142/97  Pulse: 76  Resp: 18  Temp: 98.6 F (37 C)  SpO2: 100%   Filed Weights   09/03/21 1323  Weight: 172 lb 1.6 oz (78.1 kg)   .Body mass index is  28.64 kg/m. NAD GENERAL:alert, in no acute distress and comfortable SKIN: no acute rashes, no significant lesions EYES: conjunctiva are pink and non-injected, sclera anicteric OROPHARYNX: MMM, no exudates, no oropharyngeal erythema or ulceration NECK: supple, no JVD LYMPH:  no palpable lymphadenopathy in the cervical, axillary or inguinal regions LUNGS: clear to auscultation b/l with normal respiratory effort HEART: regular rate & rhythm ABDOMEN:  normoactive bowel sounds , non tender, not distended. Extremity: no pedal edema PSYCH: alert & oriented x 3 with fluent speech NEURO: no focal motor/sensory deficits   LABORATORY DATA:  I have reviewed the data as listed  .    Latest Ref Rng & Units 09/04/2021   10:18 AM 08/22/2021    2:13 PM 08/12/2021   10:42 AM  CBC  WBC 4.0 - 10.5 K/uL 6.8  8.6  13.4   Hemoglobin 12.0 - 15.0 g/dL 93.5  70.1  77.9   Hematocrit 36.0 - 46.0 % 44.4  46.2  45.4   Platelets 150 - 400 K/uL 267  289.0  251     .    Latest Ref Rng & Units 08/12/2021   10:42 AM 03/21/2021    7:02 PM 03/08/2021    2:28 PM  CMP  Glucose 70 - 99 mg/dL 390  300  85   BUN 6 - 20 mg/dL 9  8  6    Creatinine 0.44 - 1.00 mg/dL  9.23  3.00   Sodium 135 - 145 mmol/L 139  139  138   Potassium 3.5 - 5.1 mmol/L 3.3  3.3  3.4   Chloride 98 - 111 mmol/L 112  109  105   CO2 22 - 32 mmol/L 19  21  24    Calcium 8.9 - 10.3 mg/dL 8.6  8.7  8.8   Total Protein 6.5 - 8.1 g/dL 7.1   6.7   Total Bilirubin 0.3 - 1.2 mg/dL 0.7   0.4    Alkaline Phos 38 - 126 U/L 66   58   AST 15 - 41 U/L 14   12   ALT 0 - 44 U/L 15   11      RADIOGRAPHIC STUDIES: I have personally reviewed the radiological images as listed and agreed with the findings in the report.  ASSESSMENT & PLAN:   #1 history of primary cryofibrinogenemia  Patient had previously reported VTE episodes in the past during her last visit-- and now denies this history. Has been on heparin prophylaxis with previous pregnancies.   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 Patient now denies this. She does not have outside records that can confirm her VTE episodes. She notes on her last visit with Dr 7.62 that no anticoagulation was recommended. 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 PLAN: -Discussed recent available labs with the patient in detail and noted that her polycythemia is very borderline and is likely to be reactive.  At her age the chances of this being polycythemia vera is relatively low. -Patient is very anxious and would like to definitively rule this out. -Labs done today CBC shows hemoglobin of 15.6 with a normal hematocrit of 44.4 with normal WBC count and platelets. Patient does not have any palpable hepatosplenomegaly -JAK2 mutation testing has been sent out to rule out any evidence of clonal erythrocytosis. -Patient was also recommended to continue follow-up with [facility] with Dr. Kerry James  for continued evaluation and management of her cryofibrinogen anemia. Recommended patient drink at least 2 L of water daily Continue follow-up with primary care physician to manage issues around anxiety and other medical issues.  FOLLOW UP: Labs today Phone visit with Dr Candise Che in 2 weeks Referral to [facility] for h/o Cryofibrinogenemia (has seen Dr Abigail James previously)  .The total time spent  in the appointment was 20 minutes* .  All of the patient's questions were answered with apparent satisfaction. The patient knows to call the clinic with any problems, questions or concerns.   Wyvonnia Lora MD MS AAHIVMS [facility] Hematology/Oncology Physician [facility]  .*Total Encounter Time as defined by the Centers for Medicare and Medicaid Services includes, in addition to the face-to-face time of a patient visit (documented in the note above) non-face-to-face time: obtaining and reviewing outside history, ordering and reviewing medications, tests or procedures, care coordination (communications with other health care professionals or caregivers) and documentation in the medical record.

## 2021-09-10 ENCOUNTER — Encounter: Payer: Self-pay | Admitting: Hematology

## 2021-09-10 LAB — JAK2 (INCLUDING V617F AND EXON 12), MPL,& CALR-NEXT GEN SEQ

## 2021-09-17 ENCOUNTER — Inpatient Hospital Stay (HOSPITAL_BASED_OUTPATIENT_CLINIC_OR_DEPARTMENT_OTHER): Payer: 59 | Admitting: Hematology

## 2021-09-17 DIAGNOSIS — Z7183 Encounter for nonprocreative genetic counseling: Secondary | ICD-10-CM

## 2021-09-17 DIAGNOSIS — D892 Hypergammaglobulinemia, unspecified: Secondary | ICD-10-CM | POA: Diagnosis not present

## 2021-09-20 ENCOUNTER — Telehealth: Payer: Self-pay | Admitting: Hematology

## 2021-09-20 ENCOUNTER — Encounter: Payer: Self-pay | Admitting: Hematology

## 2021-09-20 NOTE — Telephone Encounter (Signed)
.  Called patient to schedule appointment per 6/23 inbasket, patient is aware of date and time.

## 2021-09-23 NOTE — Progress Notes (Signed)
HEMATOLOGY/ONCOLOGY PHONE VISIT NOTE  Date of Service: .09/17/2021   Patient Care Team: Lesia Sago, MD as PCP - General (Internal Medicine)  CHIEF COMPLAINTS/PURPOSE OF CONSULTATION:  Discussion of lab results for work-up of polycythemia HISTORY OF PRESENTING ILLNESS: 08/29/2020  Abigail James is a wonderful 36 y.o. female who has been referred to [place] by Aleatha Borer, NP at [facility] for evaluation and management of personal hx of PE. The pt reports that she is doing well overall.  The pt reports that she was seen by Dr. Marcheta Grammes at [facility]. The pt notes that she tried to make a f/u visit there, but due to it being more than three years she would have to see a different hematologist. The pt desired to just have care closer to home if she would need to see a new doctor.  The pt notes that starting in March and April, she has been feeling very sick. It started with chest pain and she thought it was anxiety. They did an ultrasound of her lower extremities and found no clots. She notes her feet constantly hurt and she is weak all over. She notes that she had pneumonia last month and is now being told she needs to see a Pulmonologist as well. The pt notes that as of right now she is not on any blood thinners. She went to Dr. Marcheta Grammes who told her that she has never had any type of blood clot. The pt notes that she has never had a clot, denying that she had a clot during pregnancy and in the lung. She has three children and was taken off Lovenox with no issues. She had a hematoma during pregnancy but notes everything is fine and her son is a healthy three year old.  She notes her legs are always cold and never hurt like a clot. She has been trying to stay warm and avoid being too cold. To her recollection, the pt notes that during her last visit with Dr. Marcheta Grammes she did not recommend any long-term blood thinners.   INTERVAL HISTORY  ..I connected with Abigail James on  09/17/2021 at  3:30 PM EDT by telephone visit and verified that I am speaking with the correct person using two identifiers.   I discussed the limitations, risks, security and privacy concerns of performing an evaluation and management service by telemedicine and the availability of in-person appointments. I also discussed with the patient that there may be a patient responsible charge related to this service. The patient expressed understanding and agreed to proceed.   Other persons participating in the visit and their role in the encounter: NONE   Patient's location: HOME   Provider's location: [facility]   Chief Complaint: Discussion of lab work-up for polycythemia  Patient notes no new symptoms since her last clinic visit.  Labs including CBC showed no overt evidence of polycythemia.  Her NGS testing shows no JAK2, CAL reticulin or MPL mutations. We discussed that she has no clonal mutations that suggest a primary bone marrow disorder.   MEDICAL HISTORY:  Past Medical History:  Diagnosis Date   Anxiety    Asthma    Depression    Diverticulitis    Ileitis     SURGICAL HISTORY: Past Surgical History:  Procedure Laterality Date   APPENDECTOMY     CESAREAN SECTION N/A 12/19/2016   Procedure: CESAREAN SECTION;  Surgeon: Broussard Bing, MD;  Location: [facility] BIRTHING SUITES;  Service: Obstetrics;  Laterality: N/A;   CHOLECYSTECTOMY     DILATION AND CURETTAGE OF UTERUS     FOOT SURGERY     LEG SURGERY     TYMPANOSTOMY TUBE PLACEMENT      SOCIAL HISTORY: Social History   Socioeconomic History   Marital status: Single    Spouse name: Not on file   Number of children: 3   Years of education: Not on file   Highest education level: Not on file  Occupational History   Not on file  Tobacco Use   Smoking status: Former    Types: Cigarettes    Quit date: 04/08/2014    Years since quitting: 7.4   Smokeless tobacco: Never  Vaping Use   Vaping Use: Never used  Substance and Sexual  Activity   Alcohol use: Not Currently   Drug use: No   Sexual activity: Yes    Birth control/protection: Other-see comments  Other Topics Concern   Not on file  Social History Narrative   ** Merged History Encounter **       Social Determinants of Health   Financial Resource Strain: Not on file  Food Insecurity: Not on file  Transportation Needs: Not on file  Physical Activity: Not on file  Stress: Not on file  Social Connections: Not on file  Intimate Partner Violence: Not on file    FAMILY HISTORY: Family History  Problem Relation Age of Onset   Hypertension Father    Hypertension Brother    Suicidality Brother    Hypertension Maternal Uncle    Cancer Maternal Grandmother    Hypertension Paternal Grandmother    Diabetes Paternal Grandmother    Cancer Paternal Grandmother    Colon cancer Neg Hx    Colon polyps Neg Hx    Esophageal cancer Neg Hx    Rectal cancer Neg Hx    Stomach cancer Neg Hx     ALLERGIES:  is allergic to ciprofloxacin, isosorbide, latex, vancomycin, amoxicillin, and sulfa antibiotics.  MEDICATIONS:  Current Outpatient Medications  Medication Sig Dispense Refill   dicyclomine (BENTYL) 10 MG capsule Take 1 capsule (10 mg total) by mouth 3 (three) times daily as needed for spasms. Take 1 tablet 2-3 times daily as needed for abdominal pain/bloating. 90 capsule 4   etonogestrel-ethinyl estradiol (NUVARING) 0.12-0.015 MG/24HR vaginal ring INSERT 1 RING VAGINALLY AS DIRECTED. REMOVE AFTER 3 WEEKS & WAIT 7 DAYS BEFORE INSERTING A NEW RING 3 each 3   Multiple Vitamins-Minerals (MULTIVITAMIN WOMEN PO) Take 1 tablet by mouth daily.     naproxen (NAPROSYN) 500 MG tablet Take 1 tablet (500 mg total) by mouth 2 (two) times daily as needed for moderate pain. 15 tablet 0   omeprazole (PRILOSEC) 20 MG capsule Take 20 mg by mouth daily.     ondansetron (ZOFRAN-ODT) 4 MG disintegrating tablet Take 1 tablet (4 mg total) by mouth every 6 (six) hours as needed for  nausea or vomiting. (Patient taking differently: Take 4 mg by mouth as needed for nausea or vomiting.) 20 tablet 0   No current facility-administered medications for this visit.    REVIEW OF SYSTEMS:   10 Point review of Systems was done is negative except as noted above.  PHYSICAL EXAMINATION: Telemedicine visit  LABORATORY DATA:  I have reviewed the data as listed  .    Latest Ref Rng & Units 09/04/2021   10:18 AM 08/22/2021    2:13 PM 08/12/2021   10:42 AM  CBC  WBC 4.0 - 10.5  K/uL 6.8  8.6  13.4   Hemoglobin 12.0 - 15.0 g/dL 16.1  09.6  04.5   Hematocrit 36.0 - 46.0 % 44.4  46.2  45.4   Platelets 150 - 400 K/uL 267  289.0  251     .    Latest Ref Rng & Units 08/12/2021   10:42 AM 03/21/2021    7:02 PM 03/08/2021    2:28 PM  CMP  Glucose 70 - 99 mg/dL 409  811  85   BUN 6 - 20 mg/dL 9  8  6    Creatinine 0.44 - 1.00 mg/dL 9.14  7.82  9.56   Sodium 135 - 145 mmol/L 139  139  138   Potassium 3.5 - 5.1 mmol/L 3.3  3.3  3.4   Chloride 98 - 111 mmol/L 112  109  105   CO2 22 - 32 mmol/L 19  21  24    Calcium 8.9 - 10.3 mg/dL 8.6  8.7  8.8   Total Protein 6.5 - 8.1 g/dL 7.1   6.7   Total Bilirubin 0.3 - 1.2 mg/dL 0.7   0.4   Alkaline Phos 38 - 126 U/L 66   58   AST 15 - 41 U/L 14   12   ALT 0 - 44 U/L 15   11      RADIOGRAPHIC STUDIES: I have personally reviewed the radiological images as listed and agreed with the findings in the report.  ASSESSMENT & PLAN:   #1 history of primary cryofibrinogenemia  Patient had previously reported VTE episodes in the past during her last visit-- and now denies this history. Has been on heparin prophylaxis with previous pregnancies.   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 Patient now denies this. She does not have outside records that can confirm her VTE episodes. She notes on her last visit with Dr Kerry Dory that no anticoagulation was  recommended. 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 PLAN: -Patient's recent lab results were discussed in detail.  No overt evidence of polycythemia on CBC. -Labs done showed no evidence of clonal erythrocytosis ruling out polycythemia ver -Patient was also recommended to continue follow-up with [facility] with Dr. Renita Papa for continued evaluation and management of her cryofibrinogen anemia. 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  All of the patient's questions were answered with apparent satisfaction. The patient knows to call the clinic with any problems, questions or concerns.   Wyvonnia Lora MD MS AAHIVMS [facility] Hematology/Oncology Physician [facility]  .*Total Encounter Time as defined by the Centers for Medicare and Medicaid Services includes, in addition to the face-to-face time of a patient visit (documented in the note above) non-face-to-face time: obtaining and reviewing outside history, ordering and reviewing medications, tests or procedures, care coordination (communications with other health care professionals or caregivers) and documentation in the medical record.

## 2021-10-10 ENCOUNTER — Encounter: Payer: Self-pay | Admitting: Gastroenterology

## 2021-10-10 DIAGNOSIS — R1032 Left lower quadrant pain: Secondary | ICD-10-CM | POA: Diagnosis not present

## 2021-11-07 ENCOUNTER — Telehealth: Payer: Self-pay | Admitting: Genetic Counselor

## 2021-11-07 ENCOUNTER — Inpatient Hospital Stay: Payer: Medicaid Other | Admitting: Genetic Counselor

## 2021-11-07 ENCOUNTER — Inpatient Hospital Stay: Payer: Medicaid Other

## 2021-11-07 NOTE — Telephone Encounter (Signed)
LVM telling patient that referral from Dr. Candise Che is out of scope for genetics we see here at the [facility].  Cancelling genetics appt for today at 2pm.  Will hear from Dr. Clyda Greener office about a referral to appropriate genetics professional.  Contact information provided.

## 2021-12-18 DIAGNOSIS — J029 Acute pharyngitis, unspecified: Secondary | ICD-10-CM | POA: Diagnosis not present

## 2021-12-18 DIAGNOSIS — Z20818 Contact with and (suspected) exposure to other bacterial communicable diseases: Secondary | ICD-10-CM | POA: Diagnosis not present

## 2022-02-24 ENCOUNTER — Other Ambulatory Visit: Payer: Self-pay | Admitting: Family Medicine

## 2022-02-28 ENCOUNTER — Encounter: Payer: Self-pay | Admitting: Gastroenterology

## 2022-02-28 ENCOUNTER — Telehealth: Payer: Self-pay | Admitting: Gastroenterology

## 2022-02-28 DIAGNOSIS — K529 Noninfective gastroenteritis and colitis, unspecified: Secondary | ICD-10-CM

## 2022-02-28 MED ORDER — AMOXICILLIN-POT CLAVULANATE 875-125 MG PO TABS: 1.0000 | ORAL_TABLET | Freq: Two times a day (BID) | ORAL | 0 refills | Status: DC

## 2022-02-28 MED ORDER — ONDANSETRON 4 MG PO TBDP: 4.0000 mg | ORAL_TABLET | Freq: Four times a day (QID) | ORAL | 0 refills | Status: DC | PRN

## 2022-02-28 NOTE — Telephone Encounter (Signed)
Refill for zofran sent to pharmacy.

## 2022-02-28 NOTE — Telephone Encounter (Signed)
Inbound call from patient requesting a refill for Zofran. Please advise.

## 2022-02-28 NOTE — Telephone Encounter (Signed)
Pt called into the office today. She states that she feels like she is having a "flare up" like before. She has been having severe abdominal pain, nausea, and vomiting. Pt states that symptoms started 2 days ago. She describes the pain as sharp pains in her mid abdomen. Pt reports some diarrhea. Pt states that she was told to call in for antibiotics the next time this happened. She restarted Dicyclomine this week and it helps some, pt has pain in her abdomen with standing. Please advise, thanks.

## 2022-04-02 ENCOUNTER — Other Ambulatory Visit: Payer: Self-pay | Admitting: Family Medicine

## 2022-04-28 ENCOUNTER — Ambulatory Visit: Payer: Medicaid Other | Admitting: Gastroenterology

## 2022-05-20 IMAGING — CT CT ANGIO CHEST
2 of 6 series · 18 of 36 positions shown · IV contrast (omnipaque)
Comparison: Chest CTA 07/20/2020.

CLINICAL DATA: Shortness of breath with cough for 2 days. Clinical
concern for pulmonary embolism. Elevated D-dimer levels.

EXAM:
CT ANGIOGRAPHY CHEST WITH CONTRAST
TECHNIQUE: Multidetector CT imaging of the chest was performed using the
standard protocol during bolus administration of intravenous
contrast. Multiplanar CT image reconstructions and MIPs were
obtained to evaluate the vascular anatomy.
CONTRAST:  80mL OMNIPAQUE IOHEXOL 350 MG/ML SOLN

[Series 5: thins · axial · 0.67mm/px · z∈[-267,-45]mm · 17 of 250 slices shown]
[im 14/250  lung]
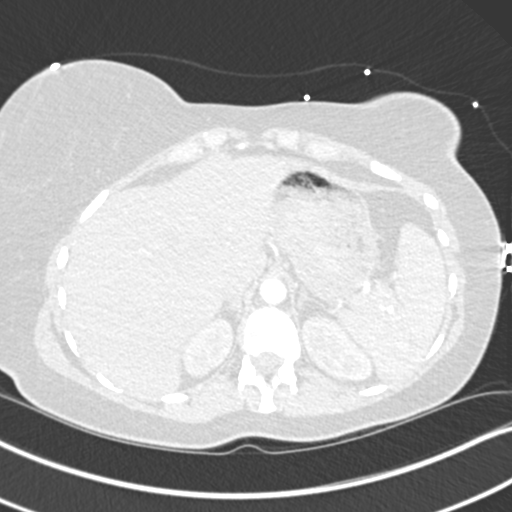
[im 28/250  mediastinal]
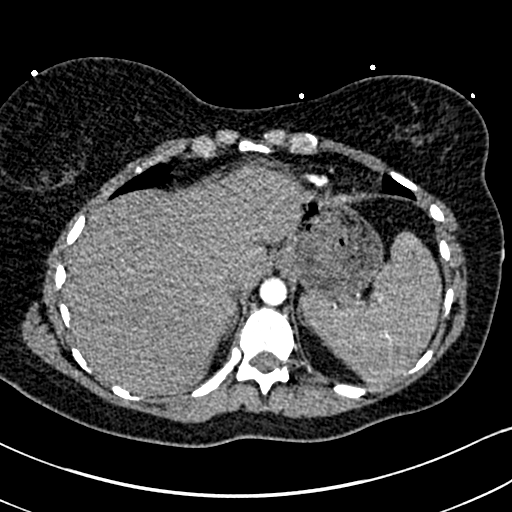
[im 42/250  lung]
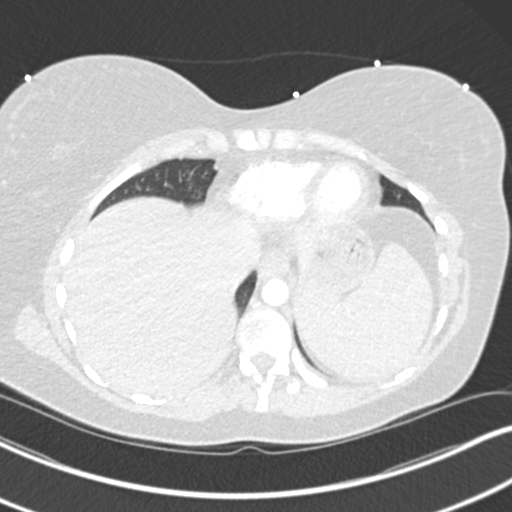
[im 56/250  mediastinal]
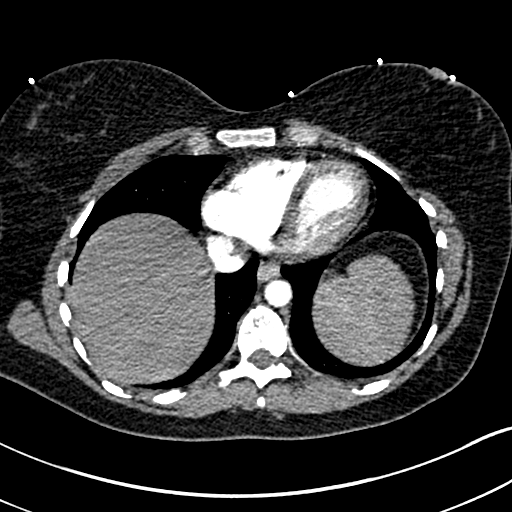
[im 70/250  lung]
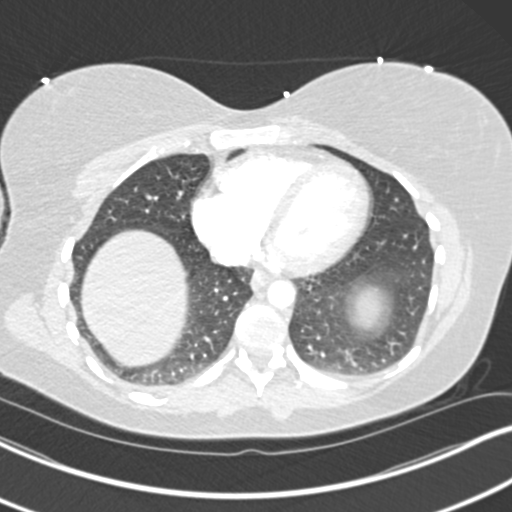
[im 84/250  mediastinal]
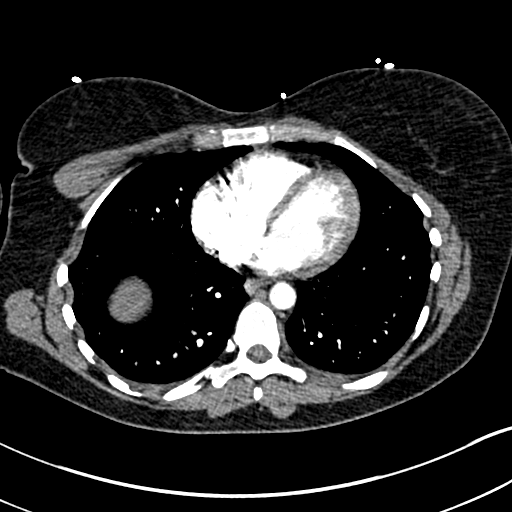
[im 97/250  lung]
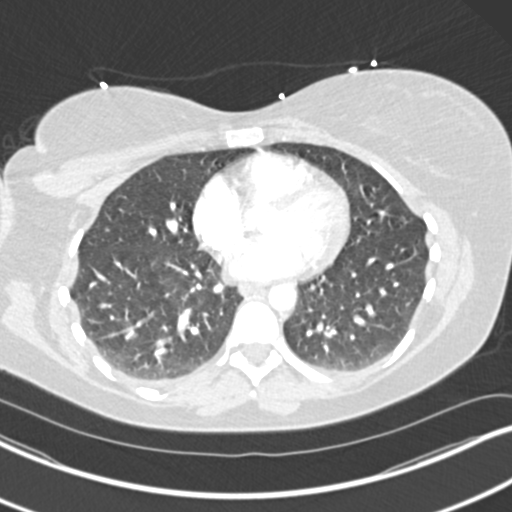
[im 111/250  mediastinal]
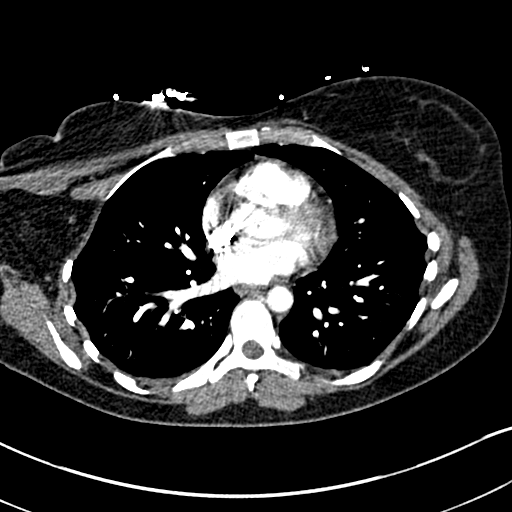
[im 125/250  lung]
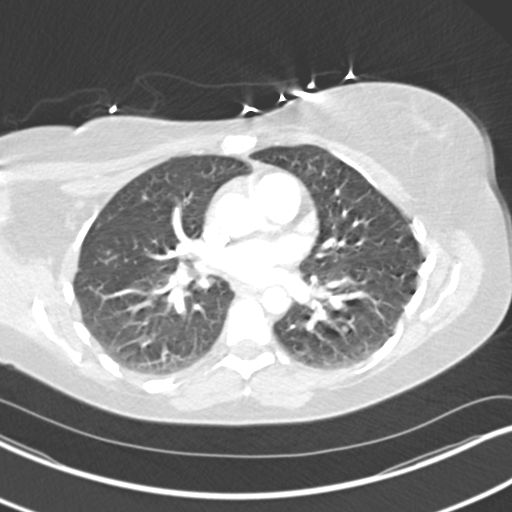
[im 139/250  mediastinal]
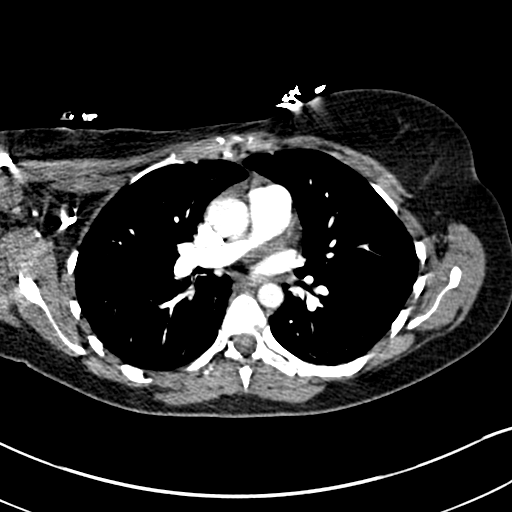
[im 153/250  lung]
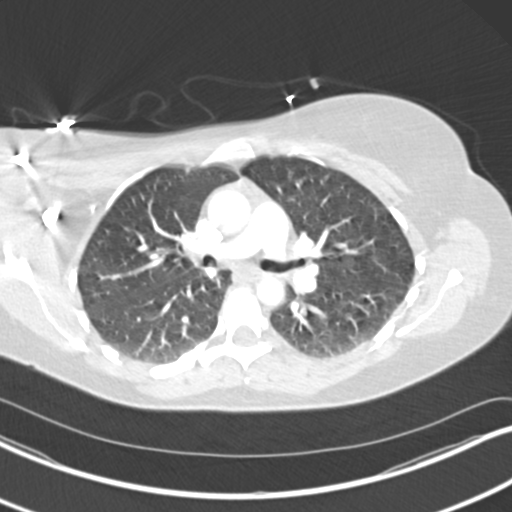
[im 167/250  mediastinal]
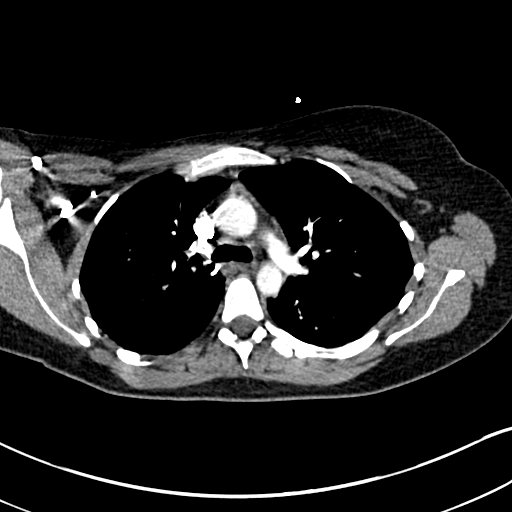
[im 180/250  lung]
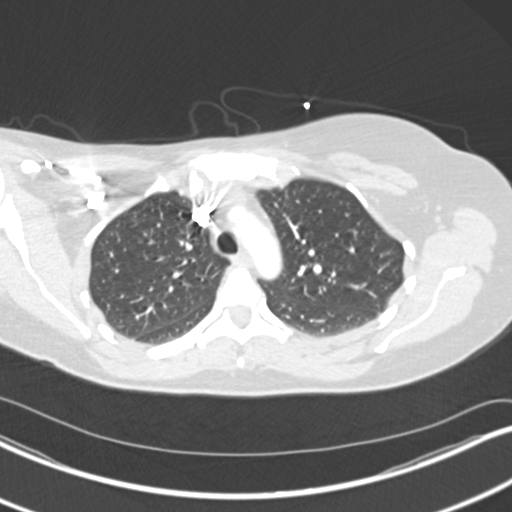
[im 194/250  mediastinal]
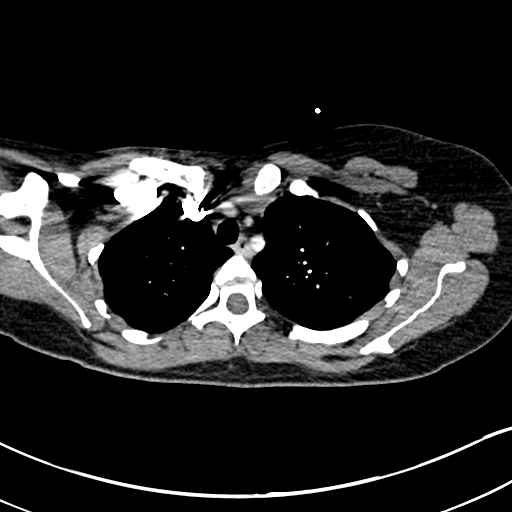
[im 208/250  lung]
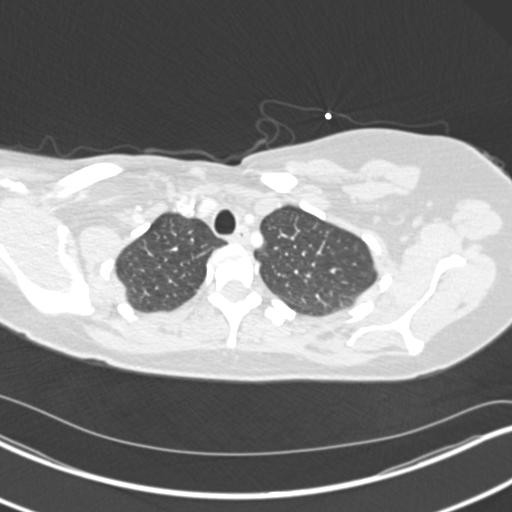
[im 222/250  mediastinal]
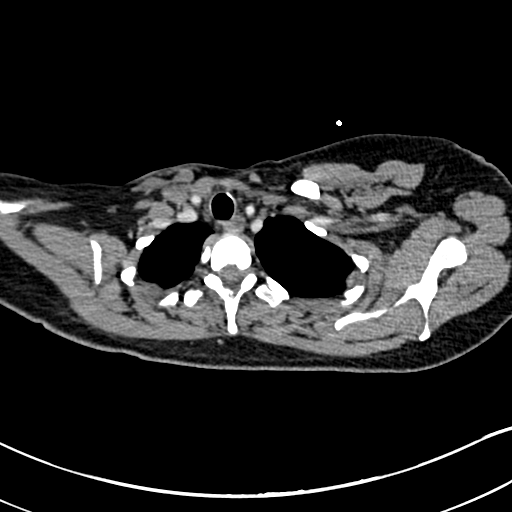
[im 236/250  lung]
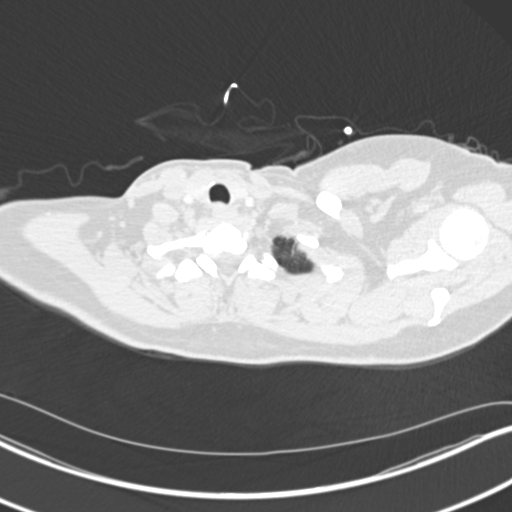

[Series 7: coronal mpr · coronal · 0.54mm/px · 1 of 128 slices shown]
[im 64/128  mediastinal]
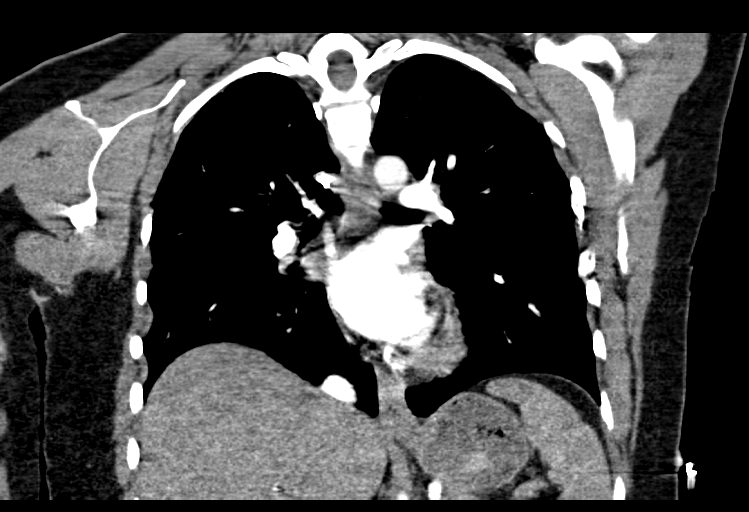

[18 of 36 positions shown; findings below may reference images not displayed]

FINDINGS: Cardiovascular: The pulmonary arteries are well opacified with
contrast to the level of the subsegmental branches. There is no
evidence of acute pulmonary embolism. No significant systemic
arterial abnormalities are identified. The heart size is normal.
There is no pericardial effusion.

Mediastinum/Nodes: There are no enlarged mediastinal, hilar or
axillary lymph nodes. The thyroid gland, trachea and esophagus
demonstrate no significant findings.

Lungs/Pleura: There is no pleural effusion or pneumothorax.
Pulmonary assessment mildly limited by breathing artifact. No
confluent airspace opacity or suspicious pulmonary nodularity. Mild
dependent atelectasis bilaterally.

Upper abdomen: The visualized upper abdomen appears stable without
significant findings.

Musculoskeletal/Chest wall: There is no chest wall mass or
suspicious osseous finding.

Review of the MIP images confirms the above findings.
IMPRESSION: No evidence of acute pulmonary embolism or other acute chest
findings. Mild dependent atelectasis in both lungs.

## 2022-10-11 IMAGING — CT CT ABD-PELV W/ CM
2 of 4 series · 16 of 46 positions shown, 18 images · IV contrast (agent unspecified)
Comparison: March 30, 2019.

CLINICAL DATA: A 35-year-old female presents with history of
abdominal pain in the RIGHT upper quadrant and with diarrhea,
history of diverticulitis in the past.

EXAM:
CT ABDOMEN AND PELVIS WITH CONTRAST
TECHNIQUE: Multidetector CT imaging of the abdomen and pelvis was performed
using the standard protocol following bolus administration of
intravenous contrast.

[Series 2: axial st · axial · 0.98mm/px · z∈[-469,-54]mm · 13 of 95 slices shown, 15 images]
[im 6/95  soft-tissue]
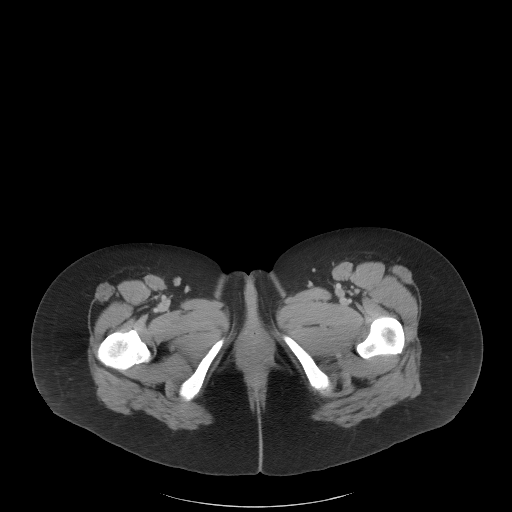
[im 6/95  bone]
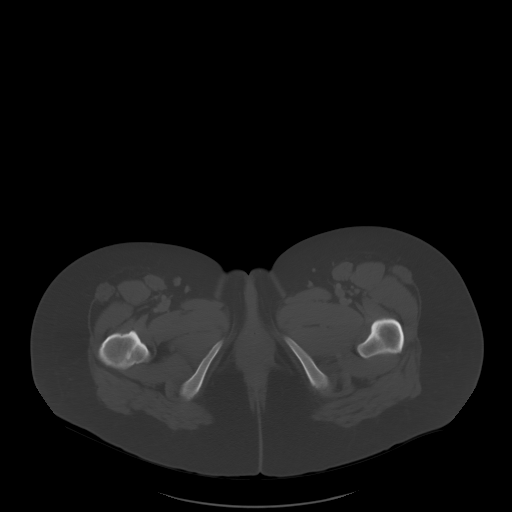
[im 12/95  soft-tissue]
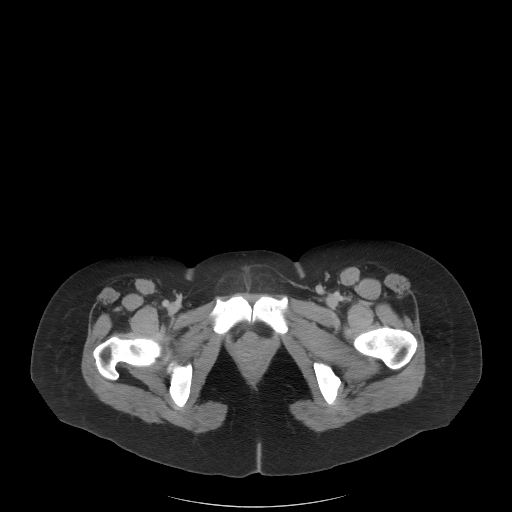
[im 23/95  soft-tissue]
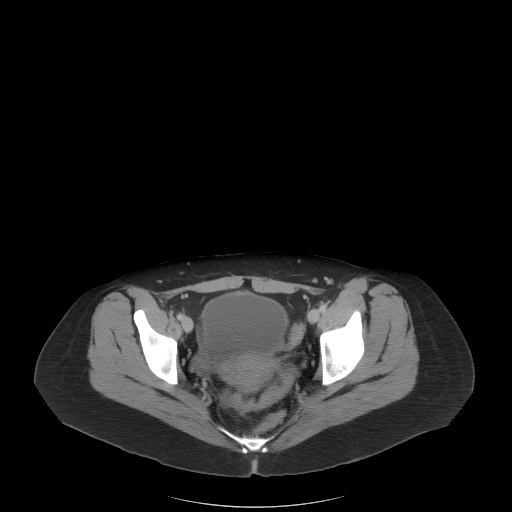
[im 28/95  soft-tissue]
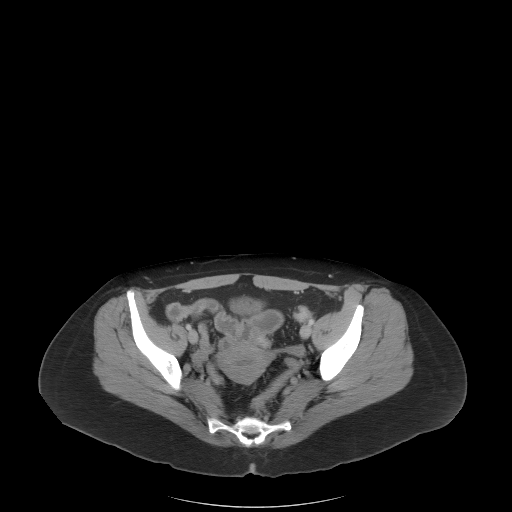
[im 34/95  soft-tissue]
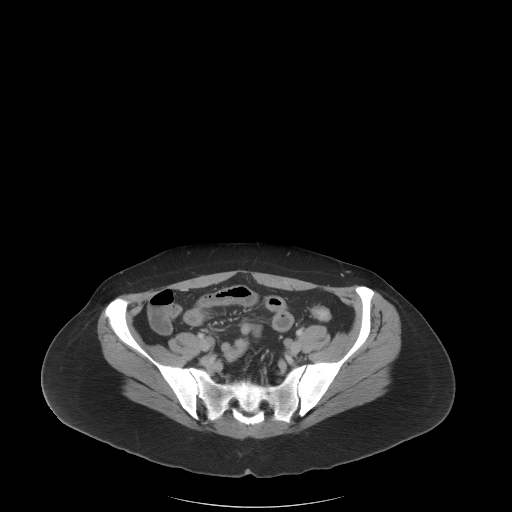
[im 39/95  soft-tissue]
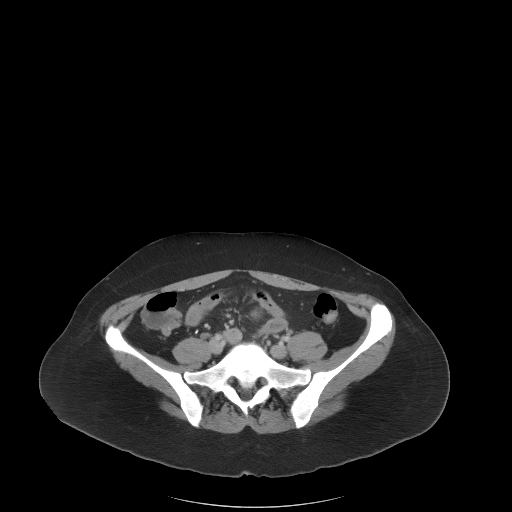
[im 50/95  soft-tissue]
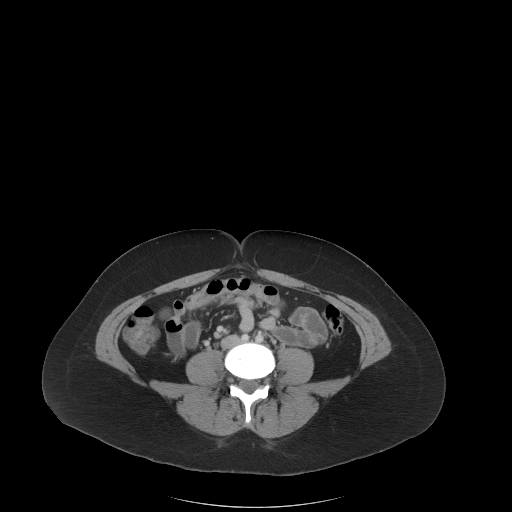
[im 56/95  soft-tissue]
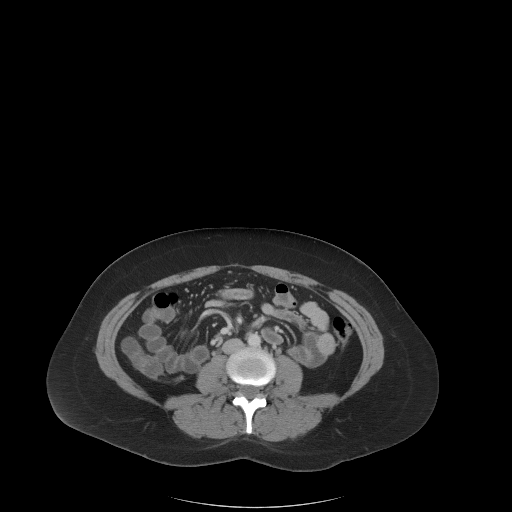
[im 61/95  soft-tissue]
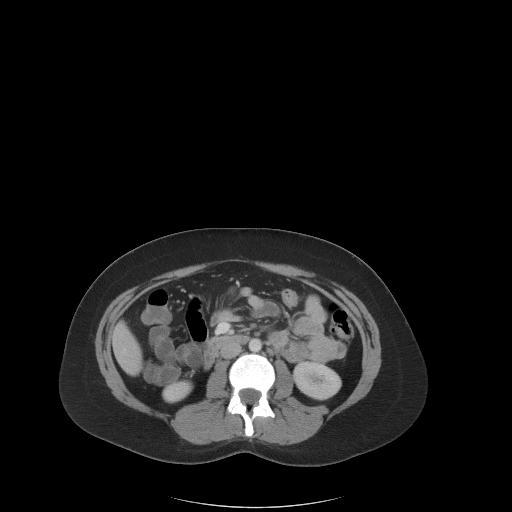
[im 61/95  bone]
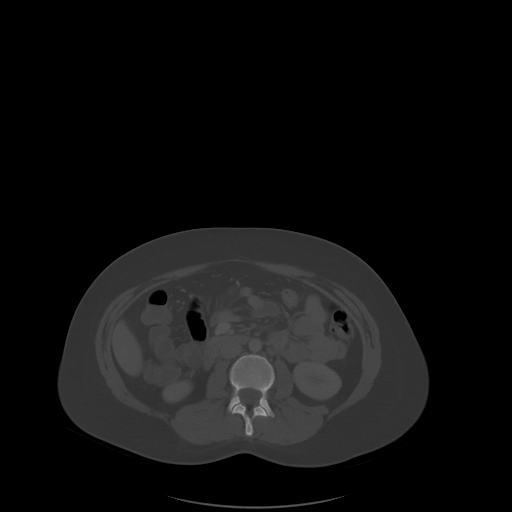
[im 67/95  soft-tissue]
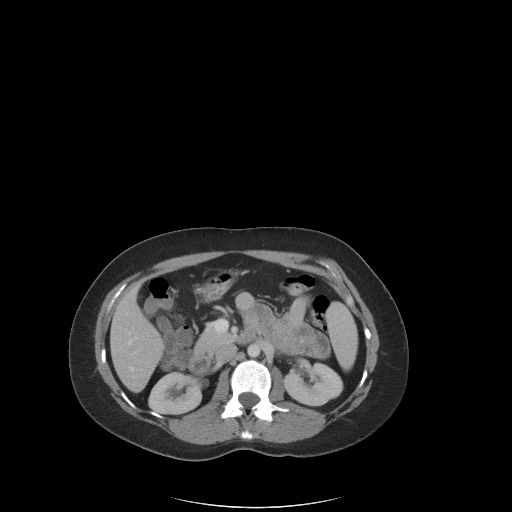
[im 72/95  soft-tissue]
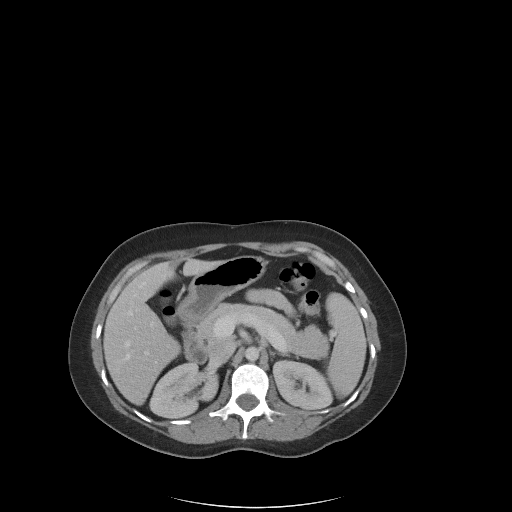
[im 83/95  soft-tissue]
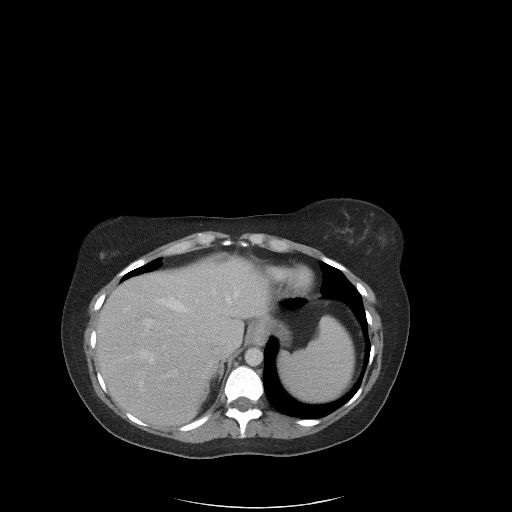
[im 89/95  soft-tissue]
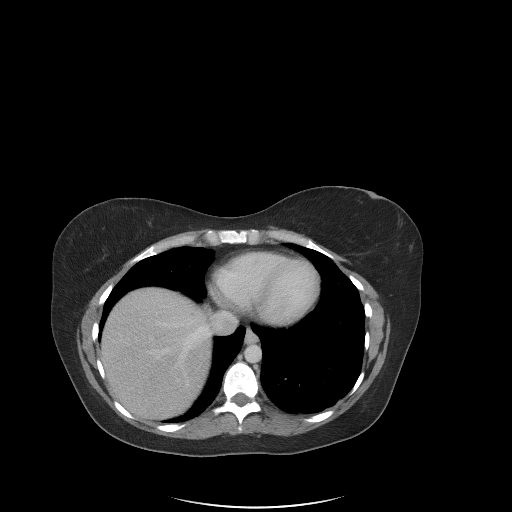

[Series 5: coronal st · coronal · 0.89mm/px · 3 of 103 slices shown]
[im 35/103  soft-tissue]
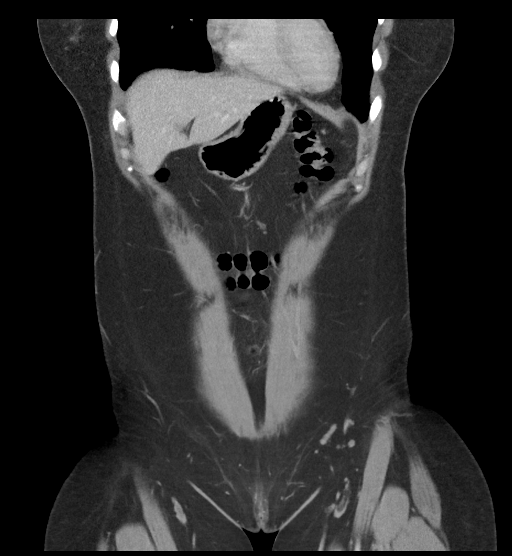
[im 46/103  soft-tissue]
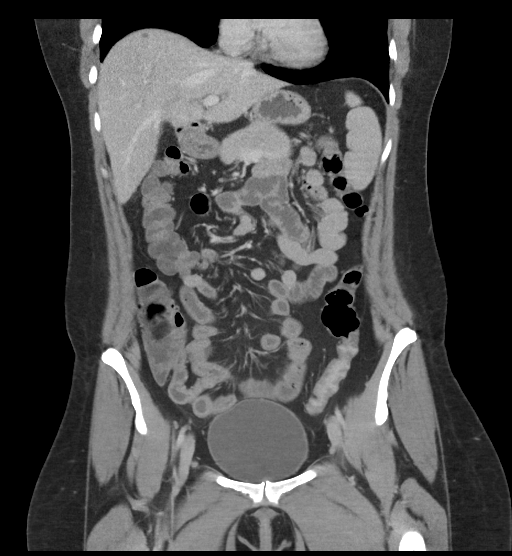
[im 57/103  soft-tissue]
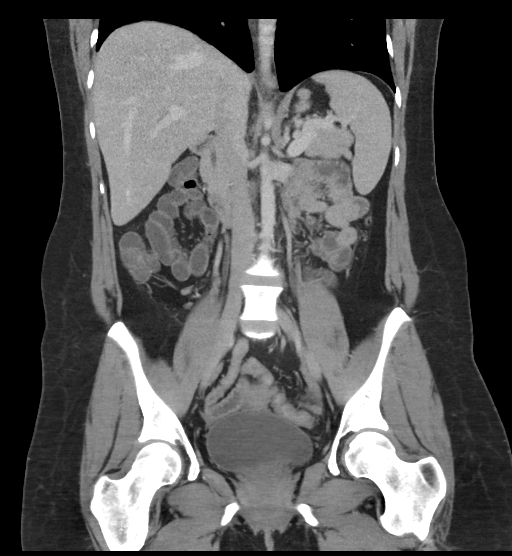

[16 of 46 positions shown; findings below may reference images not displayed]

RADIATION DOSE REDUCTION: This exam was performed according to the
departmental dose-optimization program which includes automated
exposure control, adjustment of the mA and/or kV according to
patient size and/or use of iterative reconstruction technique.

CONTRAST:  100mL OMNIPAQUE IOHEXOL 300 MG/ML  SOLN
FINDINGS: Lower chest: Lung bases are clear aside from mild basilar
atelectasis.

Hepatobiliary: Focal fat along the fissure for the falciform
ligament. No focal, suspicious hepatic lesion. Background hepatic
steatosis. Portal vein is patent. Post cholecystectomy. Minimal
biliary duct dilation of intrahepatic biliary tree is improved
compared to prior imaging.

Pancreas: Normal, without mass, inflammation or ductal dilatation.

Spleen: Normal.

Adrenals/Urinary Tract: Adrenal glands are normal.

Symmetric renal enhancement without hydronephrosis. No suspicious
renal lesion. No perivesical stranding. No perinephric stranding.

Stomach/Bowel: Mild to moderate mural stratification of distal small
bowel loops. No sign of bowel obstruction. Mild perienteric
stranding about distal small bowel loops. Post appendectomy. Colon
is collapsed. No pneumatosis. No no ascites.

Vascular/Lymphatic:

Aorta with smooth contours. IVC with smooth contours. No aneurysmal
dilation of the abdominal aorta. There is no gastrohepatic or
hepatoduodenal ligament lymphadenopathy. No retroperitoneal or
mesenteric lymphadenopathy.

No pelvic sidewall lymphadenopathy.

Reproductive: Hormone ring or similar device within the vagina. No
adnexal mass.

Other: No ascites.  No pneumoperitoneum.

Musculoskeletal: No acute or significant osseous findings.
IMPRESSION: 1. Mild to moderate mural stratification of distal small bowel loops
with mild perienteric stranding about distal small bowel loops.
Findings of enteritis or recurrent though not as severe as on
previous imaging, not currently associated with ascites, obstruction
or pneumatosis. This raise the question of underlying inflammatory
bowel disease. Infectious enteritis or drug induced enteritis are
also considered. GI consultation may be warranted given recurrence
of enteritis with similar distribution.
2. Post cholecystectomy with near complete resolution of mild
intrahepatic biliary duct distension seen on previous imaging.
3. Post appendectomy.

## 2023-05-22 ENCOUNTER — Other Ambulatory Visit: Payer: Self-pay | Admitting: Family Medicine

## 2023-06-25 ENCOUNTER — Encounter (HOSPITAL_BASED_OUTPATIENT_CLINIC_OR_DEPARTMENT_OTHER): Payer: Self-pay | Admitting: Emergency Medicine

## 2023-06-25 ENCOUNTER — Other Ambulatory Visit: Payer: Self-pay

## 2023-06-25 ENCOUNTER — Emergency Department (HOSPITAL_BASED_OUTPATIENT_CLINIC_OR_DEPARTMENT_OTHER)
Admission: EM | Admit: 2023-06-25 | Discharge: 2023-06-25 | Disposition: A | Discharge status: Home / Self Care | Attending: Emergency Medicine | Admitting: Emergency Medicine

## 2023-06-25 ENCOUNTER — Emergency Department (HOSPITAL_BASED_OUTPATIENT_CLINIC_OR_DEPARTMENT_OTHER)

## 2023-06-25 DIAGNOSIS — M791 Myalgia, unspecified site: Secondary | ICD-10-CM | POA: Diagnosis not present

## 2023-06-25 DIAGNOSIS — K529 Noninfective gastroenteritis and colitis, unspecified: Secondary | ICD-10-CM | POA: Diagnosis not present

## 2023-06-25 DIAGNOSIS — Z91040 Latex allergy status: Secondary | ICD-10-CM | POA: Diagnosis not present

## 2023-06-25 DIAGNOSIS — R519 Headache, unspecified: Secondary | ICD-10-CM | POA: Insufficient documentation

## 2023-06-25 LAB — CBC WITH DIFFERENTIAL/PLATELET
Abs Immature Granulocytes: 0.03 10*3/uL (ref 0.00–0.07)
Basophils Absolute: 0.1 10*3/uL (ref 0.0–0.1)
Basophils Relative: 1 %
Eosinophils Absolute: 0.4 10*3/uL (ref 0.0–0.5)
Eosinophils Relative: 5 %
HCT: 44.2 % (ref 36.0–46.0)
Hemoglobin: 15.6 g/dL — ABNORMAL HIGH (ref 12.0–15.0)
Immature Granulocytes: 0 %
Lymphocytes Relative: 30 %
Lymphs Abs: 2.5 10*3/uL (ref 0.7–4.0)
MCH: 28.4 pg (ref 26.0–34.0)
MCHC: 35.3 g/dL (ref 30.0–36.0)
MCV: 80.5 fL (ref 80.0–100.0)
Monocytes Absolute: 0.6 10*3/uL (ref 0.1–1.0)
Monocytes Relative: 7 %
Neutro Abs: 4.8 10*3/uL (ref 1.7–7.7)
Neutrophils Relative %: 57 %
Platelets: 268 10*3/uL (ref 150–400)
RBC: 5.49 MIL/uL — ABNORMAL HIGH (ref 3.87–5.11)
RDW: 12.6 % (ref 11.5–15.5)
WBC: 8.3 10*3/uL (ref 4.0–10.5)
nRBC: 0 % (ref 0.0–0.2)

## 2023-06-25 LAB — COMPREHENSIVE METABOLIC PANEL WITH GFR
ALT: 11 U/L (ref 0–44)
AST: 15 U/L (ref 15–41)
Albumin: 4.1 g/dL (ref 3.5–5.0)
Alkaline Phosphatase: 66 U/L (ref 38–126)
Anion gap: 9 (ref 5–15)
BUN: 6 mg/dL (ref 6–20)
CO2: 21 mmol/L — ABNORMAL LOW (ref 22–32)
Calcium: 8.6 mg/dL — ABNORMAL LOW (ref 8.9–10.3)
Chloride: 109 mmol/L (ref 98–111)
Creatinine, Ser: 0.74 mg/dL (ref 0.44–1.00)
GFR, Estimated: >60 mL/min (ref >60)
Glucose, Bld: 99 mg/dL (ref 70–99)
Potassium: 3.2 mmol/L — ABNORMAL LOW (ref 3.5–5.1)
Sodium: 139 mmol/L (ref 135–145)
Total Bilirubin: 0.7 mg/dL (ref 0.0–1.2)
Total Protein: 6.5 g/dL (ref 6.5–8.1)

## 2023-06-25 LAB — MAGNESIUM: Magnesium: 1.9 mg/dL (ref 1.7–2.4)

## 2023-06-25 LAB — LIPASE, BLOOD: Lipase: 19 U/L (ref 11–51)

## 2023-06-25 MED ORDER — KETOROLAC TROMETHAMINE 15 MG/ML IJ SOLN
15.0000 mg | Freq: Once | INTRAMUSCULAR | Status: AC
  Administered 2023-06-25: 15 mg via INTRAVENOUS
  Filled 2023-06-25: qty 1

## 2023-06-25 MED ORDER — POTASSIUM CHLORIDE 10 MEQ/100ML IV SOLN
INTRAVENOUS | Status: AC
  Filled 2023-06-25: qty 100

## 2023-06-25 MED ORDER — POTASSIUM CHLORIDE 10 MEQ/100ML IV SOLN
10.0000 meq | Freq: Once | INTRAVENOUS | Status: DC
  Filled 2023-06-25: qty 100

## 2023-06-25 MED ORDER — DIPHENHYDRAMINE HCL 50 MG/ML IJ SOLN
25.0000 mg | Freq: Once | INTRAMUSCULAR | Status: AC
  Administered 2023-06-25: 25 mg via INTRAVENOUS
  Filled 2023-06-25: qty 1

## 2023-06-25 MED ORDER — ONDANSETRON HCL 4 MG PO TABS: 4.0000 mg | ORAL_TABLET | Freq: Four times a day (QID) | ORAL | 0 refills | Status: DC | PRN

## 2023-06-25 MED ORDER — ONDANSETRON HCL 4 MG/2ML IJ SOLN
4.0000 mg | Freq: Once | INTRAMUSCULAR | Status: AC
  Administered 2023-06-25: 4 mg via INTRAVENOUS
  Filled 2023-06-25: qty 2

## 2023-06-25 MED ORDER — SODIUM CHLORIDE 0.9 % IV BOLUS
1000.0000 mL | Freq: Once | INTRAVENOUS | Status: AC
  Administered 2023-06-25: 1000 mL via INTRAVENOUS

## 2023-06-25 MED ORDER — METOCLOPRAMIDE HCL 5 MG/ML IJ SOLN
10.0000 mg | Freq: Once | INTRAMUSCULAR | Status: AC
Start: 2023-06-25 — End: 2023-06-25
  Administered 2023-06-25: 10 mg via INTRAVENOUS
  Filled 2023-06-25: qty 2

## 2023-06-25 NOTE — Discharge Instructions (Signed)
 Rest, increase hydration, prescription for antinausea med zofran sent to [facility].  Pick up imodium of diarrhea restarts.

## 2023-06-25 NOTE — ED Provider Notes (Signed)
 [place] EMERGENCY DEPARTMENT AT [facility] Provider Note   CSN: [id number] Arrival date & time: 06/25/23  7846     History  Chief Complaint  Patient presents with   Headache    Abigail James is a 38 y.o. female.  Patient is a 38 year old female presenting for headache.  Patient admits to severe headache that started 3 days ago with associated nausea, vomiting, diarrhea.  No improvement with home Excedrin.  Patient admits to light sensitivity.  Patient also endorses fevers, chills, and myalgias.  The history is provided by the patient. No language interpreter was used.  Headache Associated symptoms: diarrhea, fever, nausea and vomiting   Associated symptoms: no abdominal pain, no back pain, no cough, no ear pain, no eye pain, no seizures and no sore throat        Home Medications Prior to Admission medications   Medication Sig Start Date End Date Taking? Authorizing Provider  ondansetron (ZOFRAN) 4 MG tablet Take 1 tablet (4 mg total) by mouth every 6 (six) hours as needed for nausea or vomiting. 06/25/23  Yes Edwin Dada P, DO  amoxicillin-clavulanate (AUGMENTIN) 875-125 MG tablet Take 1 tablet by mouth 2 (two) times daily. 02/28/22   Armbruster, Willaim Rayas, MD  dicyclomine (BENTYL) 10 MG capsule Take 1 capsule (10 mg total) by mouth 3 (three) times daily as needed for spasms. Take 1 tablet 2-3 times daily as needed for abdominal pain/bloating. 08/22/21   Esterwood, Amy S, PA-C  etonogestrel-ethinyl estradiol (NUVARING) 0.12-0.015 MG/24HR vaginal ring INSERT 1 RING VAGINALLY AS DIRECTED. REMOVE AFTER 3 WEEKS & WAIT 7 DAYS BEFORE INSERTING A NEW RING 05/22/23   Levie Heritage, DO  Multiple Vitamins-Minerals (MULTIVITAMIN WOMEN PO) Take 1 tablet by mouth daily.    [provider]  naproxen (NAPROSYN) 500 MG tablet Take 1 tablet (500 mg total) by mouth 2 (two) times daily as needed for moderate pain. 03/21/21   Petrucelli, Lelon Mast R, PA-C  omeprazole (PRILOSEC)  20 MG capsule Take 20 mg by mouth daily.    [provider]  ondansetron (ZOFRAN-ODT) 4 MG disintegrating tablet Take 1 tablet (4 mg total) by mouth every 6 (six) hours as needed for nausea or vomiting. 02/28/22   Armbruster, Willaim Rayas, MD      Allergies    Ciprofloxacin, Isosorbide, Latex, Vancomycin, and Sulfa antibiotics    Review of Systems   Review of Systems  Constitutional:  Positive for chills and fever.  HENT:  Negative for ear pain and sore throat.   Eyes:  Negative for pain and visual disturbance.  Respiratory:  Negative for cough and shortness of breath.   Cardiovascular:  Negative for chest pain and palpitations.  Gastrointestinal:  Positive for diarrhea, nausea and vomiting. Negative for abdominal pain.  Genitourinary:  Negative for dysuria and hematuria.  Musculoskeletal:  Negative for arthralgias and back pain.  Skin:  Negative for color change and rash.  Neurological:  Positive for headaches. Negative for seizures and syncope.  All other systems reviewed and are negative.   Physical Exam Updated Vital Signs BP (!) 105/56   Pulse (!) 57   Temp 98.3 F (36.8 C) (Oral)   Resp 18   Wt 81.6 kg   SpO2 98%   BMI 29.95 kg/m  Physical Exam Vitals and nursing note reviewed.  Constitutional:      General: She is not in acute distress.    Appearance: She is well-developed.  HENT:     Head: Normocephalic and atraumatic.  Eyes:     General: Lids are normal. Vision grossly intact.     Conjunctiva/sclera: Conjunctivae normal.     Pupils: Pupils are equal, round, and reactive to light.  Cardiovascular:     Rate and Rhythm: Normal rate and regular rhythm.     Heart sounds: No murmur heard. Pulmonary:     Effort: Pulmonary effort is normal. No respiratory distress.     Breath sounds: Normal breath sounds.  Abdominal:     Palpations: Abdomen is soft.     Tenderness: There is no abdominal tenderness.  Musculoskeletal:        General: No swelling.     Cervical  back: Neck supple.  Skin:    General: Skin is warm and dry.     Capillary Refill: Capillary refill takes less than 2 seconds.  Neurological:     General: No focal deficit present.     Mental Status: She is alert and oriented to person, place, and time.     GCS: GCS eye subscore is 4. GCS verbal subscore is 5. GCS motor subscore is 6.     Cranial Nerves: Cranial nerves 2-12 are intact.     Sensory: Sensation is intact.     Motor: Motor function is intact.     Coordination: Coordination is intact.     Gait: Gait is intact.  Psychiatric:        Mood and Affect: Mood normal.     ED Results / Procedures / Treatments   Labs (all labs ordered are listed, but only abnormal results are displayed) Labs Reviewed  CBC WITH DIFFERENTIAL/PLATELET - Abnormal; Notable for the following components:      Result Value   RBC 5.49 (*)    Hemoglobin 15.6 (*)    All other components within normal limits  COMPREHENSIVE METABOLIC PANEL WITH GFR - Abnormal; Notable for the following components:   Potassium 3.2 (*)    CO2 21 (*)    Calcium 8.6 (*)    All other components within normal limits  LIPASE, BLOOD  MAGNESIUM    EKG None  Radiology CT Head Wo Contrast Result Date: 06/25/2023 CLINICAL DATA:  Worsening headache over the past 3 days. EXAM: CT HEAD WITHOUT CONTRAST TECHNIQUE: Contiguous axial images were obtained from the base of the skull through the vertex without intravenous contrast. RADIATION DOSE REDUCTION: This exam was performed according to the departmental dose-optimization program which includes automated exposure control, adjustment of the mA and/or kV according to patient size and/or use of iterative reconstruction technique. COMPARISON:  None Available. FINDINGS: Brain: No evidence of acute infarction, hemorrhage, hydrocephalus, extra-axial collection or mass lesion/mass effect. Vascular: No hyperdense vessel or unexpected calcification. Skull: Normal. Negative for fracture or focal  lesion. Sinuses/Orbits: No acute finding. Other: None. IMPRESSION: 1. Normal head CT. Electronically Signed   By: Obie Dredge M.D.   On: 06/25/2023 09:51    Procedures Procedures    Medications Ordered in ED Medications  potassium chloride 10 mEq in 100 mL IVPB (10 mEq Intravenous Not Given 06/25/23 1012)  sodium chloride 0.9 % bolus 1,000 mL (0 mLs Intravenous Stopped 06/25/23 1100)  ondansetron (ZOFRAN) injection 4 mg (4 mg Intravenous Given 06/25/23 0845)  metoCLOPramide (REGLAN) injection 10 mg (10 mg Intravenous Given 06/25/23 1008)  diphenhydrAMINE (BENADRYL) injection 25 mg (25 mg Intravenous Given 06/25/23 1008)  ketorolac (TORADOL) 15 MG/ML injection 15 mg (15 mg Intravenous Given 06/25/23 1008)  potassium chloride 10 MEQ/100ML IVPB (0 mEq  Stopped 06/25/23  1232)    ED Course/ Medical Decision Making/ A&P                                 Medical Decision Making Amount and/or Complexity of Data Reviewed Labs: ordered. Radiology: ordered.  Risk Prescription drug management.    38 year old female presenting for headache, chills, nausea, vomiting, diarrhea.  Patient is alert and oriented x 3, no acute distress, afebrile, stable to signs.  Patient states her headache was acute in onset and preceded the nausea and vomiting.  She states that the worst headache of her life and she is never had a headache like this before.  CT head ordered for concern for subarachnoid hemorrhage.  CT head stable.  Symptoms of nausea, vomiting, diarrhea and headache likely secondary to gastroenteritis.  IV fluids, Toradol, Zofran, given for symptomatic management.  Reevaluation demonstrated minimal improvement.  Reglan and Benadryl ordered.  Hypokalemia present.  Potassium ordered.  Symptoms of headache thought to be likely secondary to dehydration from gastroenteritis.  No signs or symptoms of meningitis.  CT head stable.  No signs or symptoms of sepsis.  Patient is overall well-appearing,  nontoxic, and in no distress and overall condition improved here in the ED. Detailed discussions were had with the patient regarding current findings, and need for close f/u with PCP or on call doctor. The patient has been instructed to return immediately if the symptoms worsen in any way for re-evaluation. Patient verbalized understanding and is in agreement with current care plan. All questions answered prior to discharge.         Final Clinical Impression(s) / ED Diagnoses Final diagnoses:  Nonintractable headache, unspecified chronicity pattern, unspecified headache type  Gastroenteritis    Rx / DC Orders ED Discharge Orders          Ordered    ondansetron (ZOFRAN) 4 MG tablet  Every 6 hours PRN        06/25/23 1148              Franne Forts, DO 06/25/23 1528

## 2023-06-25 NOTE — ED Triage Notes (Signed)
 Pt c/o "worse HA" x 3 days with NVD. Excedrin at 0100

## 2023-09-30 ENCOUNTER — Other Ambulatory Visit: Payer: Self-pay

## 2023-09-30 ENCOUNTER — Telehealth: Payer: Self-pay | Admitting: Gastroenterology

## 2023-09-30 ENCOUNTER — Observation Stay (HOSPITAL_COMMUNITY)
Admission: EM | Admit: 2023-09-30 | Discharge: 2023-10-02 | Disposition: A | Discharge status: Home / Self Care | Source: Ambulatory Visit | Attending: Emergency Medicine | Admitting: Emergency Medicine

## 2023-09-30 ENCOUNTER — Encounter (HOSPITAL_COMMUNITY): Payer: Self-pay

## 2023-09-30 ENCOUNTER — Emergency Department (HOSPITAL_COMMUNITY)

## 2023-09-30 DIAGNOSIS — J45909 Unspecified asthma, uncomplicated: Secondary | ICD-10-CM | POA: Insufficient documentation

## 2023-09-30 DIAGNOSIS — R109 Unspecified abdominal pain: Secondary | ICD-10-CM | POA: Diagnosis present

## 2023-09-30 DIAGNOSIS — G43909 Migraine, unspecified, not intractable, without status migrainosus: Secondary | ICD-10-CM | POA: Insufficient documentation

## 2023-09-30 DIAGNOSIS — R933 Abnormal findings on diagnostic imaging of other parts of digestive tract: Secondary | ICD-10-CM | POA: Diagnosis present

## 2023-09-30 DIAGNOSIS — Z91040 Latex allergy status: Secondary | ICD-10-CM | POA: Insufficient documentation

## 2023-09-30 DIAGNOSIS — R1084 Generalized abdominal pain: Principal | ICD-10-CM

## 2023-09-30 DIAGNOSIS — K529 Noninfective gastroenteritis and colitis, unspecified: Secondary | ICD-10-CM | POA: Diagnosis not present

## 2023-09-30 LAB — COMPREHENSIVE METABOLIC PANEL WITH GFR
ALT: 12 U/L (ref 0–44)
AST: 22 U/L (ref 15–41)
Albumin: 3.2 g/dL — ABNORMAL LOW (ref 3.5–5.0)
Alkaline Phosphatase: 61 U/L (ref 38–126)
Anion gap: 11 (ref 5–15)
BUN: 9 mg/dL (ref 6–20)
CO2: 20 mmol/L — ABNORMAL LOW (ref 22–32)
Calcium: 8.9 mg/dL (ref 8.9–10.3)
Chloride: 108 mmol/L (ref 98–111)
Creatinine, Ser: 0.8 mg/dL (ref 0.44–1.00)
GFR, Estimated: >60 mL/min (ref >60)
Glucose, Bld: 85 mg/dL (ref 70–99)
Potassium: 3.7 mmol/L (ref 3.5–5.1)
Sodium: 139 mmol/L (ref 135–145)
Total Bilirubin: 1.2 mg/dL (ref 0.0–1.2)
Total Protein: 6.5 g/dL (ref 6.5–8.1)

## 2023-09-30 LAB — CBC WITH DIFFERENTIAL/PLATELET
Abs Immature Granulocytes: 0.03 10*3/uL (ref 0.00–0.07)
Basophils Absolute: 0 10*3/uL (ref 0.0–0.1)
Basophils Relative: 0 %
Eosinophils Absolute: 0.1 10*3/uL (ref 0.0–0.5)
Eosinophils Relative: 1 %
HCT: 48.6 % — ABNORMAL HIGH (ref 36.0–46.0)
Hemoglobin: 16.9 g/dL — ABNORMAL HIGH (ref 12.0–15.0)
Immature Granulocytes: 0 %
Lymphocytes Relative: 10 %
Lymphs Abs: 1.2 10*3/uL (ref 0.7–4.0)
MCH: 28.7 pg (ref 26.0–34.0)
MCHC: 34.8 g/dL (ref 30.0–36.0)
MCV: 82.5 fL (ref 80.0–100.0)
Monocytes Absolute: 0.7 10*3/uL (ref 0.1–1.0)
Monocytes Relative: 6 %
Neutro Abs: 9.3 10*3/uL — ABNORMAL HIGH (ref 1.7–7.7)
Neutrophils Relative %: 83 %
Platelets: 253 10*3/uL (ref 150–400)
RBC: 5.89 MIL/uL — ABNORMAL HIGH (ref 3.87–5.11)
RDW: 12.2 % (ref 11.5–15.5)
WBC: 11.2 10*3/uL — ABNORMAL HIGH (ref 4.0–10.5)
nRBC: 0 % (ref 0.0–0.2)

## 2023-09-30 LAB — I-STAT CHEM 8, ED
BUN: 9 mg/dL (ref 6–20)
Calcium, Ion: 1.18 mmol/L (ref 1.15–1.40)
Chloride: 105 mmol/L (ref 98–111)
Creatinine, Ser: 0.7 mg/dL (ref 0.44–1.00)
Glucose, Bld: 90 mg/dL (ref 70–99)
HCT: 48 % — ABNORMAL HIGH (ref 36.0–46.0)
Hemoglobin: 16.3 g/dL — ABNORMAL HIGH (ref 12.0–15.0)
Potassium: 3.7 mmol/L (ref 3.5–5.1)
Sodium: 139 mmol/L (ref 135–145)
TCO2: 24 mmol/L (ref 22–32)

## 2023-09-30 LAB — URINALYSIS, ROUTINE W REFLEX MICROSCOPIC
Bilirubin Urine: NEGATIVE
Glucose, UA: NEGATIVE mg/dL
Hgb urine dipstick: NEGATIVE
Ketones, ur: NEGATIVE mg/dL
Nitrite: NEGATIVE
Protein, ur: 30 mg/dL — AB
Specific Gravity, Urine: 1.03 (ref 1.005–1.030)
pH: 5 (ref 5.0–8.0)

## 2023-09-30 LAB — CBC
HCT: 44.5 % (ref 36.0–46.0)
Hemoglobin: 15.3 g/dL — ABNORMAL HIGH (ref 12.0–15.0)
MCH: 28.4 pg (ref 26.0–34.0)
MCHC: 34.4 g/dL (ref 30.0–36.0)
MCV: 82.7 fL (ref 80.0–100.0)
Platelets: 237 10*3/uL (ref 150–400)
RBC: 5.38 MIL/uL — ABNORMAL HIGH (ref 3.87–5.11)
RDW: 12.3 % (ref 11.5–15.5)
WBC: 9.3 10*3/uL (ref 4.0–10.5)
nRBC: 0 % (ref 0.0–0.2)

## 2023-09-30 LAB — PREGNANCY, URINE: Preg Test, Ur: NEGATIVE

## 2023-09-30 LAB — HIV ANTIBODY (ROUTINE TESTING W REFLEX): HIV Screen 4th Generation wRfx: NONREACTIVE

## 2023-09-30 LAB — LIPASE, BLOOD: Lipase: 25 U/L (ref 11–51)

## 2023-09-30 LAB — CREATININE, SERUM
Creatinine, Ser: 0.71 mg/dL (ref 0.44–1.00)
GFR, Estimated: >60 mL/min (ref >60)

## 2023-09-30 MED ORDER — ENOXAPARIN SODIUM 40 MG/0.4ML IJ SOSY
40.0000 mg | PREFILLED_SYRINGE | INTRAMUSCULAR | Status: DC
  Filled 2023-09-30: qty 0.4

## 2023-09-30 MED ORDER — IOHEXOL 350 MG/ML SOLN
75.0000 mL | Freq: Once | INTRAVENOUS | Status: AC | PRN
  Administered 2023-09-30: 75 mL via INTRAVENOUS

## 2023-09-30 MED ORDER — SODIUM CHLORIDE 0.9 % IV SOLN
INTRAVENOUS | Status: AC
Start: 2023-09-30 — End: 2023-10-01

## 2023-09-30 MED ORDER — ONDANSETRON HCL 4 MG/2ML IJ SOLN: 4.0000 mg | INTRAMUSCULAR | Status: DC | PRN

## 2023-09-30 MED ORDER — HYDROMORPHONE HCL 1 MG/ML IJ SOLN
0.5000 mg | INTRAMUSCULAR | Status: DC | PRN
  Administered 2023-09-30 – 2023-10-01 (×3): 0.5 mg via INTRAVENOUS
  Filled 2023-09-30: qty 0.5
  Filled 2023-09-30 (×2): qty 1

## 2023-09-30 MED ORDER — ACETAMINOPHEN 500 MG PO TABS
500.0000 mg | ORAL_TABLET | Freq: Four times a day (QID) | ORAL | Status: DC | PRN
  Administered 2023-10-02: 500 mg via ORAL
  Filled 2023-09-30: qty 1

## 2023-09-30 MED ORDER — ONDANSETRON HCL 4 MG PO TABS: 4.0000 mg | ORAL_TABLET | Freq: Four times a day (QID) | ORAL | Status: DC | PRN

## 2023-09-30 MED ORDER — HYDROMORPHONE HCL 1 MG/ML IJ SOLN: 0.5000 mg | INTRAMUSCULAR | Status: DC | PRN

## 2023-09-30 MED ORDER — MORPHINE SULFATE (PF) 4 MG/ML IV SOLN: 4.0000 mg | Freq: Once | INTRAVENOUS | Status: DC

## 2023-09-30 MED ORDER — DICYCLOMINE HCL 10 MG PO CAPS
10.0000 mg | ORAL_CAPSULE | Freq: Three times a day (TID) | ORAL | Status: DC | PRN
  Administered 2023-10-02: 10 mg via ORAL
  Filled 2023-09-30: qty 1

## 2023-09-30 MED ORDER — PANTOPRAZOLE SODIUM 40 MG PO TBEC
40.0000 mg | DELAYED_RELEASE_TABLET | Freq: Every day | ORAL | Status: DC
  Administered 2023-09-30 – 2023-10-02 (×3): 40 mg via ORAL
  Filled 2023-09-30 (×3): qty 1

## 2023-09-30 MED ORDER — ONDANSETRON HCL 4 MG/2ML IJ SOLN
4.0000 mg | Freq: Four times a day (QID) | INTRAMUSCULAR | Status: DC | PRN
  Administered 2023-09-30 – 2023-10-02 (×4): 4 mg via INTRAVENOUS
  Filled 2023-09-30 (×4): qty 2

## 2023-09-30 MED ORDER — SODIUM CHLORIDE 0.9 % IV SOLN: Freq: Once | INTRAVENOUS | Status: AC

## 2023-09-30 MED ORDER — ONDANSETRON HCL 4 MG/2ML IJ SOLN
4.0000 mg | Freq: Once | INTRAMUSCULAR | Status: AC
  Administered 2023-09-30: 4 mg via INTRAVENOUS
  Filled 2023-09-30: qty 2

## 2023-09-30 MED ORDER — KETOROLAC TROMETHAMINE 30 MG/ML IJ SOLN: 30.0000 mg | Freq: Once | INTRAMUSCULAR | Status: DC

## 2023-09-30 MED ORDER — ACETAMINOPHEN 500 MG PO TABS
500.0000 mg | ORAL_TABLET | Freq: Once | ORAL | Status: AC
  Administered 2023-09-30: 500 mg via ORAL
  Filled 2023-09-30: qty 1

## 2023-09-30 MED ORDER — SODIUM CHLORIDE 0.9 % IV BOLUS
1000.0000 mL | Freq: Once | INTRAVENOUS | Status: AC
  Administered 2023-09-30: 1000 mL via INTRAVENOUS

## 2023-09-30 NOTE — ED Provider Notes (Signed)
 [place] EMERGENCY DEPARTMENT AT [facility] Provider Note   CSN: [id number] Arrival date & time: 09/30/23  1150     Patient presents with: Abdominal Pain   Abigail James is a 38 y.o. female.   Patient presents to ED after having to leave work due to abdominal pain. Patient in obvious discomfort, holding stomach, walking slowly, and moving cautiously. She states she has a history of diverticulitis and she called her GI before coming to the ED and they told her to come here. She states her GI doctor prescribes her Bentyl .   Patient reports the pain started last night and worsened throughout the night. She states she felt hot throughout the night, needing a fan. She threw up twice after dinner and has had diarrhea since last night. She's been trying to drink water but says it comes right out. She denies recent illness and sick contacts. She denies blood in the stool. She has a nuva ring and does not get a period. She does state her urine had an odor today.   The history is provided by the patient.  Abdominal Pain Pain location:  L flank Pain quality: sharp   Duration:  1 day Associated symptoms: diarrhea and vomiting   Associated symptoms comment:  Vomited twice last night, diarrhea ongoing since last night      Prior to Admission medications   Medication Sig Start Date End Date Taking? Authorizing Provider  amoxicillin -clavulanate (AUGMENTIN ) 875-125 MG tablet Take 1 tablet by mouth 2 (two) times daily. 02/28/22   Armbruster, Elspeth SQUIBB, MD  dicyclomine  (BENTYL ) 10 MG capsule Take 1 capsule (10 mg total) by mouth 3 (three) times daily as needed for spasms. Take 1 tablet 2-3 times daily as needed for abdominal pain/bloating. 08/22/21   Esterwood, Amy S, PA-C  etonogestrel -ethinyl estradiol  (NUVARING) 0.12-0.015 MG/24HR vaginal ring INSERT 1 RING VAGINALLY AS DIRECTED. REMOVE AFTER 3 WEEKS & WAIT 7 DAYS BEFORE INSERTING A NEW RING 05/22/23   Stinson, Jacob J, DO  Multiple  Vitamins-Minerals (MULTIVITAMIN WOMEN PO) Take 1 tablet by mouth daily.    [provider]  naproxen  (NAPROSYN ) 500 MG tablet Take 1 tablet (500 mg total) by mouth 2 (two) times daily as needed for moderate pain. 03/21/21   Petrucelli, Samantha R, PA-C  omeprazole (PRILOSEC) 20 MG capsule Take 20 mg by mouth daily.    [provider]  ondansetron  (ZOFRAN ) 4 MG tablet Take 1 tablet (4 mg total) by mouth every 6 (six) hours as needed for nausea or vomiting. 06/25/23   Elnor Hila P, DO  ondansetron  (ZOFRAN -ODT) 4 MG disintegrating tablet Take 1 tablet (4 mg total) by mouth every 6 (six) hours as needed for nausea or vomiting. 02/28/22   Armbruster, Elspeth SQUIBB, MD    Allergies: Ciprofloxacin, Isosorbide, Latex, Vancomycin, and Sulfa antibiotics    Review of Systems  Gastrointestinal:  Positive for abdominal pain, diarrhea and vomiting.    Updated Vital Signs BP (!) 140/102 (BP Location: Right Arm)   Pulse (!) 128   Temp 97.9 F (36.6 C) (Oral)   Resp 19   Ht 5' 5 (1.651 m)   Wt 79.8 kg   SpO2 100%   BMI 29.29 kg/m   Physical Exam Cardiovascular:     Rate and Rhythm: Tachycardia present.  Abdominal:     General: Abdomen is flat. Bowel sounds are normal.     Palpations: Abdomen is soft.     Tenderness: There is generalized abdominal tenderness. There is  left CVA tenderness.  Skin:    General: Skin is warm.  Neurological:     Mental Status: She is alert.     (all labs ordered are listed, but only abnormal results are displayed) Labs Reviewed  URINALYSIS, ROUTINE W REFLEX MICROSCOPIC  CBC WITH DIFFERENTIAL/PLATELET  COMPREHENSIVE METABOLIC PANEL WITH GFR  LIPASE, BLOOD  I-STAT CHEM 8, ED  POC URINE PREG, ED    EKG: None  Radiology: No results found.   Procedures   Medications Ordered in the ED  acetaminophen  (TYLENOL ) tablet 500 mg (has no administration in time range)  sodium chloride  0.9 % bolus 1,000 mL (has no administration in time range)   ondansetron  (ZOFRAN ) injection 4 mg (has no administration in time range)  morphine  (PF) 4 MG/ML injection 4 mg (has no administration in time range)    Clinical Course as of 09/30/23 1606  Wed Sep 30, 2023  1424 POC Urine Pregnancy, ED (not at [facility] or [facility]) [EL]  1449 Urinalysis, Routine w reflex microscopic -Urine, Clean Catch(!) [EL]  1449 POC Urine Pregnancy, ED (not at [facility] or [facility]) [EL]  1526 POC Urine Pregnancy, ED (not at [facility] or [facility]) [EL]  1602 CT ABDOMEN PELVIS W CONTRAST [EL]    Clinical Course User Index [EL] Charmayne Holmes, DO                                 Medical Decision Making This patient presents to the ED for concern of abdominal pain, this involves an extensive number of treatment options, and is a complaint that carries with it a high risk of complications and morbidity.  The differential diagnosis includes diverticulitis (ileitis), bowel obstruction, pancreatitis, gastric ulcer, urolithiasis, pyelonephritis, pregnancy, less likely appendicitis or cholecystitis as patient reports she had both of these removed.   Given the patients hypertension and tachycardia, as well as recent vomiting and diarrhea, a bolus of IV saline was ordered. As well as ondansetron  for nausea, morphine  and acetaminophen  for pain.    Co morbidities / Chronic conditions that complicate the patient evaluation  Terminal ileitis    Additional history obtained:  She had an appendectomy and cholecystectomy in the past. She is on Bentyl .   Lab Tests:  I Ordered, and personally interpreted labs.  The pertinent results include:    UA showed hazy/Khandi color, few bacteria, negative for nitrates and WBCs, making pyelonephritis less likely.   CBC: Hb 16.3, likely due to hemoconcentration from dehydration.  WBC 11.2, making a peritoneal abscess or bowel perforation less likely.   Pregnancy test is negative.   Pending lipase.   Imaging Studies ordered:  I ordered imaging studies including CT  abdomen with contrast.   Waiting for results of the CT abdomen to rule out a perforation or abscess.    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 In which case patient can be sent home with antibiotics.  I ordered medication including acetaminophen  and morphine  and ondansetron . Patient has had to get up to use the bathroom several times and has not finished getting her fluid bolus at the time of shift change. She understands we are waiting for the CT results to come back to rule out perforation. Lipase to rule out pancreatitis.   I have reviewed the patients home medicines and have made adjustments as needed  Transitioning care to Dr. Elbridge. Thank you for taking over the  care of this patient.   Consultations Obtained:  None.         Amount and/or Complexity of Data Reviewed Labs: ordered. Decision-making details documented in ED Course. Radiology: ordered. Decision-making details documented in ED Course.  Risk OTC drugs. Prescription drug management.        Final diagnoses:  None    ED Discharge Orders     None          Charmayne Holmes, DO 09/30/23 1610    Emil Share, DO 10/01/23 1048

## 2023-09-30 NOTE — Telephone Encounter (Signed)
 Patient calls stating that starting around 7 pm last night, she had 2 episodes of vomiting along with a bowel movement that looked like pellets. Following this, she began having severe watery diarrhea and is unable to number the amount of times she may have had these movements. States she got only about 1 hour of sleep last night due to bowel movements. No blood noted with bowel movements. Patient also describes generalized crampy abdominal pain that feels like contractions. Currently has a temp of 99.66F this morning and had chills last night though did not check her temp at that time. Patient denies any additional nausea since original vomiting episodes.   Additionally, patient states that she has been having sharp midsternal chest pains and says this actually started before she ever had any episodes of vomiting/diarrhea. States she had someone in her office check her blood pressure because of the chest pain and they told her it was  like 148/83.   States she feels like she needs to take a deep breath but cannot.  Patient with history of enteritis, not followed since 2023; last colonoscopy 2021.  Unfortunately, we do not have any availability to see patient in the office until 10/07/23.   I have advised patient for continued diarrhea, worsening abdominal pain, any inability to tolerate PO, and chest pain symptoms, she should seek more emergent care.

## 2023-09-30 NOTE — Telephone Encounter (Signed)
 Patient was advised previously to go to ED and is currently at [facility] ED. Will schedule next available appointment for our clinic.

## 2023-09-30 NOTE — Telephone Encounter (Signed)
 Abigail James, as patient is experiencing sharp mid chest pain and feels like she cannot take in a deep breath along with with abdominal cramping pain, N/V and diarrhea, I recommend for the patient to go to the ED for further evaluation to include labs, chest xray, EKG and CTAP. Pls schedule patient for a follow up, next available appointment.

## 2023-09-30 NOTE — Telephone Encounter (Signed)
 Left message for patient to call back.  Last visit 09/06/21 (televisit).

## 2023-09-30 NOTE — ED Triage Notes (Signed)
 Pt to er, pt states that she has a hx of diverticulitis, states that she has had this twice before, states that she tried to just go see her gi doc, but he sent her to the er.  Pt states that she has been having abd pain and diarrhea since last night,

## 2023-09-30 NOTE — H&P (Signed)
 History and Physical    Abigail James FMW:[id number] DOB: 1985-08-13 DOA: 09/30/2023  Referring MD/NP/PA: EDP PCP:  Patient coming from: Home  Chief Complaint: Abdominal pain nausea with and diarrhea  HPI: Abigail James is a 37/F, works as a Engineer, site in neurosurgery clinic, history of intermittent episodes of abdominal pain and constipation, back in 2020 she was seen in the ER and was suspected to have diverticulitis, however-CT more suggestive of ileitis, after this in 2021 underwent a colonoscopy which was normal, since then has had few more episodes where she has abdominal pain and constipation, subjectively got better with different courses of antibiotics.  Has been doing well recently until yesterday developed severe abdominal pain cramps and ongoing diarrhea which has now become watery.  Denies fevers, some report of chills, ate a frozen burger yesterday and she thinks her symptoms started after this. ED Course: In the ER tachycardic lab, labs suggestive of hemoconcentration, CT with thickened inflamed small bowel loops suggestive of enteritis  Review of Systems: As per HPI otherwise 14 point review of systems negative.   Past Medical History:  Diagnosis Date   Anxiety    Asthma    Depression    Diverticulitis    Ileitis     Past Surgical History:  Procedure Laterality Date   APPENDECTOMY     CESAREAN SECTION N/A 12/19/2016   Procedure: CESAREAN SECTION;  Surgeon: Izell Harari, MD;  Location: [facility] BIRTHING SUIT[facility];  Service: Obstetrics;  Laterality: N/A;   CHOLECYSTECTOMY     DILATION AND CURETTAGE OF UTERUS     FOOT SURGERY     LEG SURGERY     TYMPANOSTOMY TUBE PLACEMENT       reports that she quit smoking about 9 years ago. Her smoking use included cigarettes. She has never used smokeless tobacco. She reports that she does not currently use alcohol. She reports that she does not use drugs.  Allergies  Allergen Reactions   Ciprofloxacin Anaphylaxis    Isosorbide Other (See Comments)    Patient has terrible headaches and bodyaches on Imdur. Other reaction(s): Other (See Comments) Patient has terrible headaches and bodyaches on Imdur. Patient has terrible headaches and bodyaches on Imdur.   Latex Swelling   Vancomycin Nausea And Vomiting   Sulfa Antibiotics Swelling and Rash    Family History  Problem Relation Age of Onset   Hypertension Father    Hypertension Brother    Suicidality Brother    Hypertension Maternal Uncle    Cancer Maternal Grandmother    Hypertension Paternal Grandmother    Diabetes Paternal Grandmother    Cancer Paternal Grandmother    Colon cancer Neg Hx    Colon polyps Neg Hx    Esophageal cancer Neg Hx    Rectal cancer Neg Hx    Stomach cancer Neg Hx      Prior to Admission medications   Medication Sig Start Date End Date Taking? Authorizing Provider  amoxicillin -clavulanate (AUGMENTIN ) 875-125 MG tablet Take 1 tablet by mouth 2 (two) times daily. 02/28/22   Armbruster, Elspeth SQUIBB, MD  dicyclomine  (BENTYL ) 10 MG capsule Take 1 capsule (10 mg total) by mouth 3 (three) times daily as needed for spasms. Take 1 tablet 2-3 times daily as needed for abdominal pain/bloating. 08/22/21   Esterwood, Amy S, PA-C  etonogestrel -ethinyl estradiol  (NUVARING) 0.12-0.015 MG/24HR vaginal ring INSERT 1 RING VAGINALLY AS DIRECTED. REMOVE AFTER 3 WEEKS & WAIT 7 DAYS BEFORE INSERTING A NEW RING 05/22/23   Stinson, Jacob J,  DO  Multiple Vitamins-Minerals (MULTIVITAMIN WOMEN PO) Take 1 tablet by mouth daily.    [provider]  naproxen  (NAPROSYN ) 500 MG tablet Take 1 tablet (500 mg total) by mouth 2 (two) times daily as needed for moderate pain. 03/21/21   Petrucelli, Samantha R, PA-C  omeprazole (PRILOSEC) 20 MG capsule Take 20 mg by mouth daily.    [provider]  ondansetron  (ZOFRAN ) 4 MG tablet Take 1 tablet (4 mg total) by mouth every 6 (six) hours as needed for nausea or vomiting. 06/25/23   Elnor Hila P, DO   ondansetron  (ZOFRAN -ODT) 4 MG disintegrating tablet Take 1 tablet (4 mg total) by mouth every 6 (six) hours as needed for nausea or vomiting. 02/28/22   Leigh Elspeth SQUIBB, MD    Physical Exam: Vitals:   09/30/23 1153 09/30/23 1201 09/30/23 1430 09/30/23 1609  BP: (!) 147/103 (!) 140/102 116/84   Pulse: (!) 119 (!) 128 91   Resp: 20 19 18    Temp: 98.9 F (37.2 C) 97.9 F (36.6 C)  98.2 F (36.8 C)  TempSrc:  Oral  Oral  SpO2: 96% 100% 98%   Weight:  79.8 kg    Height:  5' 5 (1.651 m)        Constitutional: NAD, calm, comfortable HEENT:no JVD Respiratory: clear to auscultation bilaterally Cardiovascular: S1S2/RRR Abdomen: soft, mildly tender, distended, bowel sounds present Musculoskeletal: No joint deformity upper and lower extremities. Ext: no edema Skin: no rashes Neurologic: CN 2-12 grossly intact. Sensation intact, DTR normal. Strength 5/5 in all 4.  Psychiatric: Normal judgment and insight. Alert and oriented x 3. Normal mood.   Labs on Admission: I have personally reviewed following labs and imaging studies  CBC: Recent Labs  Lab 09/30/23 1422 09/30/23 1431  WBC 11.2*  --   NEUTROABS 9.3*  --   HGB 16.9* 16.3*  HCT 48.6* 48.0*  MCV 82.5  --   PLT 253  --    Basic Metabolic Panel: Recent Labs  Lab 09/30/23 1422 09/30/23 1431  NA 139 139  K 3.7 3.7  CL 108 105  CO2 20*  --   GLUCOSE 85 90  BUN 9 9  CREATININE 0.80 0.70  CALCIUM 8.9  --    GFR: Estimated Creatinine Clearance: 99.5 mL/min (by C-G formula based on SCr of 0.7 mg/dL). Liver Function Tests: Recent Labs  Lab 09/30/23 1422  AST 22  ALT 12  ALKPHOS 61  BILITOT 1.2  PROT 6.5  ALBUMIN 3.2*   Recent Labs  Lab 09/30/23 1422  LIPASE 25   No results for input(s): AMMONIA in the last 168 hours. Coagulation Profile: No results for input(s): INR, PROTIME in the last 168 hours. Cardiac Enzymes: No results for input(s): CKTOTAL, CKMB, CKMBINDEX, TROPONINI in the last  168 hours. BNP (last 3 results) No results for input(s): PROBNP in the last 8760 hours. HbA1C: No results for input(s): HGBA1C in the last 72 hours. CBG: No results for input(s): GLUCAP in the last 168 hours. Lipid Profile: No results for input(s): CHOL, HDL, LDLCALC, TRIG, CHOLHDL, LDLDIRECT in the last 72 hours. Thyroid Function Tests: No results for input(s): TSH, T4TOTAL, FREET4, T3FREE, THYROIDAB in the last 72 hours. Anemia Panel: No results for input(s): VITAMINB12, FOLATE, FERRITIN, TIBC, IRON, RETICCTPCT in the last 72 hours. Urine analysis:    Component Value Date/Time   COLORURINE Shatira (A) 09/30/2023 1304   APPEARANCEUR HAZY (A) 09/30/2023 1304   LABSPEC 1.030 09/30/2023 1304   PHURINE 5.0 09/30/2023  1304   GLUCOSEU NEGATIVE 09/30/2023 1304   GLUCOSEU NEGATIVE 08/22/2021 1413   HGBUR NEGATIVE 09/30/2023 1304   BILIRUBINUR NEGATIVE 09/30/2023 1304   KETONESUR NEGATIVE 09/30/2023 1304   PROTEINUR 30 (A) 09/30/2023 1304   UROBILINOGEN 0.2 08/22/2021 1413   NITRITE NEGATIVE 09/30/2023 1304   LEUKOCYTESUR SMALL (A) 09/30/2023 1304   Sepsis Labs: @LABRCNTIP (procalcitonin:4,lacticidven:4) )No results found for this or any previous visit (from the past 240 hours).   Radiological Exams on Admission: CT ABDOMEN PELVIS W CONTRAST Result Date: 09/30/2023 CLINICAL DATA:  Abdominal pain. EXAM: CT ABDOMEN AND PELVIS WITH CONTRAST TECHNIQUE: Multidetector CT imaging of the abdomen and pelvis was performed using the standard protocol following bolus administration of intravenous contrast. RADIATION DOSE REDUCTION: This exam was performed according to the departmental dose-optimization program which includes automated exposure control, adjustment of the mA and/or kV according to patient size and/or use of iterative reconstruction technique. CONTRAST:  75mL OMNIPAQUE  IOHEXOL  350 MG/ML SOLN COMPARISON:  08/12/2021 FINDINGS: Lower chest: No acute  abnormality. Hepatobiliary: Stable focal fat deposition in the liver adjacent to the falciform ligament. Status post cholecystectomy. No biliary dilatation. Pancreas: Unremarkable. No pancreatic ductal dilatation or surrounding inflammatory changes. Spleen: Normal in size without focal abnormality. Adrenals/Urinary Tract: Adrenal glands are unremarkable. Kidneys are normal, without renal calculi, focal lesion, or hydronephrosis. Bladder is decompressed. Stomach/Bowel: Multiple thickened, hyperemic and mildly dilated small bowel loops in the pelvis containing fluid. Associated small amount of free fluid in the pelvis. No overt bowel obstruction or free intraperitoneal air. Appearance is suggestive of enteritis with potential for early partial small bowel obstruction. The appendix is surgically absent. Vascular/Lymphatic: No significant vascular findings are present. No enlarged abdominal or pelvic lymph nodes. Reproductive: Uterus and bilateral adnexa are unremarkable. Other: No hernias.  No focal abscess identified Musculoskeletal: No acute or significant osseous findings. IMPRESSION: 1. Multiple thickened, hyperemic and mildly dilated small bowel loops in the pelvis containing fluid. Associated small amount of free fluid in the pelvis. Appearance is suggestive of enteritis with potential for early partial small bowel obstruction. No overt bowel obstruction or free intraperitoneal air. 2. Status post cholecystectomy and appendectomy. Electronically Signed   By: Marcey Moan M.D.   On: 09/30/2023 16:06    EKG: Independently reviewed.  Assessment/Plan Principal Problem:    Gastroenteritis - In the background of recurrent episodes of ileitis and enteritis since 2020. - Colonoscopy 1/21 was unremarkable - CT this time suggestive of enteritis, could be exacerbated by viral illness, but unclear if she has an underlying disorder - Check GI pathogen panel, supportive care with bowel rest, antiemetics, clear  liquids, IVF - Await GI eval - Also suggested trial of elimination diet, check vitamin D, crp    H/o Migraines   DVT prophylaxis: lovenox  Code Status: Full Code  Consults called: [facility] called by EDP Admission status: Obs  Sigurd Pac MD [facility]   09/30/2023, 5:38 PM

## 2023-09-30 NOTE — Telephone Encounter (Signed)
 Inbound call from patient stating she believes she is having a diverticulitis flare up. Stating yesterday starting at 7:00 pm she started to have severe abdominal pain, vomiting, and watery stool. Patient states she is still in pain. Patient stated she is at work and if possible to call and if she does not pick up to call again and she should be able to answer. Please advise, thank you

## 2023-09-30 NOTE — Telephone Encounter (Signed)
 Thanks Dottie for taking her call and sending her to the ED - we will be seeing her in the hospital. Sorry for late response just seeing my messages now.

## 2023-09-30 NOTE — ED Provider Notes (Signed)
 LLQ Pain, awaiting labs and CT. Has had recurrent terminal ileitis but not dx Crohn's by colonoscopy.  Patient is seen by Dr. Leigh GI. Physical Exam  BP 116/84   Pulse 91   Temp 97.9 F (36.6 C) (Oral)   Resp 18   Ht 5' 5 (1.651 m)   Wt 79.8 kg   SpO2 98%   BMI 29.29 kg/m   Physical Exam  Procedures  Procedures  ED Course / MDM   Clinical Course as of 09/30/23 1653  Wed Sep 30, 2023  1424 POC Urine Pregnancy, ED (not at [facility] or [facility]) [EL]  1449 Urinalysis, Routine w reflex microscopic -Urine, Clean Catch(!) [EL]  1449 POC Urine Pregnancy, ED (not at [facility] or [facility]) [EL]  1526 POC Urine Pregnancy, ED (not at [facility] or [facility]) [EL]  1602 CT ABDOMEN PELVIS W CONTRAST [EL]    Clinical Course User Index [EL] Charmayne Holmes, DO   Medical Decision Making Amount and/or Complexity of Data Reviewed Labs: ordered. Decision-making details documented in ED Course. Radiology: ordered. Decision-making details documented in ED Course.  Risk OTC drugs. Prescription drug management. Decision regarding hospitalization.   CT imaging reviewed by radiology shows small bowel wall thickening and fluid consistent with enteritis with possible early small bowel obstruction.  This was reviewed with Dr. Leigh.  At this time with a fairly extensive changes, acute onset and possible early small bowel obstruction, plan will be for admission to medical service with consultation by gastroenterology.  Patient updated on plan and findings.       Armenta Canning, MD 09/30/23 [phone]

## 2023-10-01 DIAGNOSIS — R109 Unspecified abdominal pain: Secondary | ICD-10-CM

## 2023-10-01 DIAGNOSIS — K529 Noninfective gastroenteritis and colitis, unspecified: Secondary | ICD-10-CM | POA: Diagnosis not present

## 2023-10-01 DIAGNOSIS — R933 Abnormal findings on diagnostic imaging of other parts of digestive tract: Secondary | ICD-10-CM

## 2023-10-01 LAB — GASTROINTESTINAL PANEL BY PCR, STOOL (REPLACES STOOL CULTURE)

## 2023-10-01 LAB — CBC
HCT: 42.1 % (ref 36.0–46.0)
Hemoglobin: 14.5 g/dL (ref 12.0–15.0)
MCH: 28.7 pg (ref 26.0–34.0)
MCHC: 34.4 g/dL (ref 30.0–36.0)
MCV: 83.2 fL (ref 80.0–100.0)
Platelets: 216 10*3/uL (ref 150–400)
RBC: 5.06 MIL/uL (ref 3.87–5.11)
RDW: 12.4 % (ref 11.5–15.5)
WBC: 7.9 10*3/uL (ref 4.0–10.5)
nRBC: 0 % (ref 0.0–0.2)

## 2023-10-01 LAB — COMPREHENSIVE METABOLIC PANEL WITH GFR
ALT: 12 U/L (ref 0–44)
AST: 15 U/L (ref 15–41)
Albumin: 2.9 g/dL — ABNORMAL LOW (ref 3.5–5.0)
Alkaline Phosphatase: 52 U/L (ref 38–126)
Anion gap: 9 (ref 5–15)
BUN: <5 mg/dL — ABNORMAL LOW (ref 6–20)
CO2: 18 mmol/L — ABNORMAL LOW (ref 22–32)
Calcium: 8.2 mg/dL — ABNORMAL LOW (ref 8.9–10.3)
Chloride: 109 mmol/L (ref 98–111)
Creatinine, Ser: 1.04 mg/dL — ABNORMAL HIGH (ref 0.44–1.00)
GFR, Estimated: >60 mL/min (ref >60)
Glucose, Bld: 93 mg/dL (ref 70–99)
Potassium: 3.3 mmol/L — ABNORMAL LOW (ref 3.5–5.1)
Sodium: 136 mmol/L (ref 135–145)
Total Bilirubin: 1.1 mg/dL (ref 0.0–1.2)
Total Protein: 5.6 g/dL — ABNORMAL LOW (ref 6.5–8.1)

## 2023-10-01 LAB — VITAMIN D 25 HYDROXY (VIT D DEFICIENCY, FRACTURES): Vit D, 25-Hydroxy: 32.84 ng/mL (ref 30–100)

## 2023-10-01 LAB — C-REACTIVE PROTEIN: CRP: 10.2 mg/dL — ABNORMAL HIGH (ref <1.0)

## 2023-10-01 MED ORDER — HYDROCODONE-ACETAMINOPHEN 5-325 MG PO TABS
1.0000 | ORAL_TABLET | ORAL | Status: DC | PRN
  Administered 2023-10-01: 1 via ORAL
  Filled 2023-10-01: qty 1

## 2023-10-01 MED ORDER — POTASSIUM CHLORIDE CRYS ER 20 MEQ PO TBCR
40.0000 meq | EXTENDED_RELEASE_TABLET | Freq: Once | ORAL | Status: AC
  Administered 2023-10-01: 40 meq via ORAL
  Filled 2023-10-01: qty 2

## 2023-10-01 NOTE — Plan of Care (Signed)

## 2023-10-01 NOTE — Progress Notes (Signed)
 New Admission Note:   Arrival Method: stretcher Mental Orientation: aa+ox4 Telemetry: n/a Assessment: Completed Skin: c/d/i IV: left antecub Pain: see E-mar Tubes: n/a Safety Measures: Safety Fall Prevention Plan has been given, discussed and signed Admission: Completed [facility] Orientation: Patient has been orientated to the room, unit and staff.  Family: not present  Orders have been reviewed and implemented. Will continue to monitor the patient. Call light has been placed within reach and bed alarm has been activated.   Doyal Sias, RN

## 2023-10-01 NOTE — Consult Note (Signed)
 Consultation  Referring Provider: TRH/Joseph Primary Care Physician:  Franchot Lauraine Quant, MD (Inactive) Primary Gastroenterologist:  Dr. Leigh  Reason for Consultation: Acute illness with abdominal cramping, watery diarrhea nausea and vomiting  HPI: Abigail James is a 38 y.o. female with history of previous cholecystectomy, appendectomy, IBS and a previous episode of acute ileitis for which she had been evaluated.  She was last seen in our office in 2023 She had initially had an episode of acute abdominal pain in December 2020 and subsequent CT showed some ileitis.  She was seen in the office and then had colonoscopy in January 2021 with Dr. Leigh which was entirely normal including intubation of the terminal ileum other than noting internal hemorrhoids. In May 2023 she had had another episode of severe crampy abdominal pain nausea vomiting and diarrhea and CT had been done which showed an formation of her distal small bowel with some perinephric stranding felt to be consistent with an acute ileitis.  Had a UTI at that time and was treated with antibiotics. Was concern for underlying IBD, we had ordered a fecal calprotectin which she never returned.  She has also had some issues with elevated hemoglobin and concern for possible polycythemia.  In addition she does have a positive ANA with titer of 1-80.  Patient has had no symptoms over the past couple of years since 2023.  She says she has been careful with her diet, avoids peanuts, minimizes salads, stopped drinking soda and had been doing fine.  She said her initial episode had occurred after she had eaten some frozen roast beef.  Earlier this week she had eaten a frozen hamburger and within 2 hours of ingesting this she developed crampy abdominal pain followed by diarrhea nausea and vomiting.  She had multiple episodes of diarrhea eventually becoming watery and this has persisted over the past 2 days.  No associated fever but  subjectively has been feeling hot off and on.  Her daughter ate the same food and did not become ill. No other known infectious exposures.  She denies any recent changes in medicines, no antibiotics, or supplements.  Workup in the ER yesterday with CT of the abdomen pelvis showed multiple thickened hyperemic mildly dilated small bowel loops in the pelvis and a small amount of free fluid in the pelvis consistent with an enteritis, status postcholecystectomy.  Labs on arrival show WBC 11.1/hemoglobin 16.9/hematocrit 48 Potassium 3.7/BUN 91/creatinine 0.80 Albumin 3.2 LFTs within normal limits lipase within normal limits.  GI path panel pending  Today WBC 7.9/hemoglobin 14.5/CRP 10.2/potassium 3.3 creatinine 1.04  She is feeling better, no longer having vomiting still nausea with cramping, has been afebrile, continues to have watery diarrhea though it has slowed she has had at least 3 bowel movements today she has been tolerating liquids and would like to try some solid food    Past Medical History:  Diagnosis Date   Anxiety    Asthma    Depression    Diverticulitis    Ileitis     Past Surgical History:  Procedure Laterality Date   APPENDECTOMY     CESAREAN SECTION N/A 12/19/2016   Procedure: CESAREAN SECTION;  Surgeon: Izell Harari, MD;  Location: [facility] BIRTHING SUIT[facility];  Service: Obstetrics;  Laterality: N/A;   CHOLECYSTECTOMY     DILATION AND CURETTAGE OF UTERUS     FOOT SURGERY     LEG SURGERY     TYMPANOSTOMY TUBE PLACEMENT      Prior to Admission  medications   Medication Sig Start Date End Date Taking? Authorizing Provider  etonogestrel -ethinyl estradiol  (NUVARING) 0.12-0.015 MG/24HR vaginal ring INSERT 1 RING VAGINALLY AS DIRECTED. REMOVE AFTER 3 WEEKS & WAIT 7 DAYS BEFORE INSERTING A NEW RING 05/22/23  Yes Stinson, Jacob J, DO  QULIPTA 60 MG TABS Take 1 tablet by mouth daily.   Yes [provider]  ondansetron  (ZOFRAN -ODT) 4 MG disintegrating tablet Take 1  tablet (4 mg total) by mouth every 6 (six) hours as needed for nausea or vomiting. 02/28/22   Armbruster, Elspeth SQUIBB, MD    Current Facility-Administered Medications  Medication Dose Route Frequency Provider Last Rate Last Admin   0.9 %  sodium chloride  infusion   Intravenous Continuous Fairy Frames, MD 75 mL/hr at 10/01/23 0816 New Bag at 10/01/23 0816   acetaminophen  (TYLENOL ) tablet 500 mg  500 mg Oral Q6H PRN Fairy Frames, MD       dicyclomine  (BENTYL ) capsule 10 mg  10 mg Oral TID PRN Fairy Frames, MD       enoxaparin  (LOVENOX ) injection 40 mg  40 mg Subcutaneous Q24H Fairy Frames, MD       HYDROmorphone  (DILAUDID ) injection 0.5 mg  0.5 mg Intravenous Q3H PRN Fairy Frames, MD   0.5 mg at 10/01/23 0820   ondansetron  (ZOFRAN ) tablet 4 mg  4 mg Oral Q6H PRN Fairy Frames, MD       Or   ondansetron  (ZOFRAN ) injection 4 mg  4 mg Intravenous Q6H PRN Fairy Frames, MD   4 mg at 10/01/23 0825   pantoprazole (PROTONIX) EC tablet 40 mg  40 mg Oral Daily Fairy Frames, MD   40 mg at 10/01/23 9180    Allergies as of 09/30/2023 - Review Complete 09/30/2023  Allergen Reaction Noted   Ciprofloxacin Anaphylaxis 07/26/2013   Isosorbide Other (See Comments) 07/05/2020   Latex Swelling 05/06/2016   Vancomycin Nausea And Vomiting 05/06/2016   Sulfa antibiotics Swelling and Rash 07/26/2013    Family History  Problem Relation Age of Onset   Hypertension Father    Hypertension Brother    Suicidality Brother    Hypertension Maternal Uncle    Cancer Maternal Grandmother    Hypertension Paternal Grandmother    Diabetes Paternal Grandmother    Cancer Paternal Grandmother    Colon cancer Neg Hx    Colon polyps Neg Hx    Esophageal cancer Neg Hx    Rectal cancer Neg Hx    Stomach cancer Neg Hx     Social History   Socioeconomic History   Marital status: Single    Spouse name: Not on file   Number of children: 3   Years of education: Not on file   Highest education level:  Not on file  Occupational History   Not on file  Tobacco Use   Smoking status: Former    Current packs/day: 0.00    Types: Cigarettes    Quit date: 04/08/2014    Years since quitting: 9.4   Smokeless tobacco: Never  Vaping Use   Vaping status: Never Used  Substance and Sexual Activity   Alcohol use: Not Currently   Drug use: No   Sexual activity: Yes    Birth control/protection: Other-see comments  Other Topics Concern   Not on file  Social History Narrative   ** Merged History Encounter **       Social Drivers of Health   Financial Resource Strain: Low Risk  (06/26/2023)   Received from [facility]  Overall Financial Resource Strain (CARDIA)    Difficulty of Paying Living Expenses: Not hard at all  Food Insecurity: No Food Insecurity (06/26/2023)   Received from [facility]   Hunger Vital Sign    Within the past 12 months, you worried that your food would run out before you got the money to buy more.: Never true    Within the past 12 months, the food you bought just didn't last and you didn't have money to get more.: Never true  Transportation Needs: No Transportation Needs (06/26/2023)   Received from [facility] - Transportation    Lack of Transportation (Medical): No    Lack of Transportation (Non-Medical): No  Physical Activity: Unknown (06/26/2023)   Received from [facility]   Exercise Vital Sign    On average, how many days per week do you engage in moderate to strenuous exercise (like a brisk walk)?: 6 days    Minutes of Exercise per Session: Not on file  Stress: No Stress Concern Present (06/26/2023)   Received from [facility] - Occupational Stress Questionnaire    Feeling of Stress : Only a little  Social Connections: Socially Integrated (06/26/2023)   Received from [facility]   Social Network    How would you rate your social network (family, work, friends)?: Good participation with social  networks  Intimate Partner Violence: Not At Risk (06/26/2023)   Received from [facility]   HITS    Over the last 12 months how often did your partner physically hurt you?: Never    Over the last 12 months how often did your partner insult you or talk down to you?: Never    Over the last 12 months how often did your partner threaten you with physical harm?: Never    Over the last 12 months how often did your partner scream or curse at you?: Never    Review of Systems: Pertinent positive and negative review of systems were noted in the above HPI section.  All other review of systems was otherwise negative.   Physical Exam: Vital signs in last 24 hours: Temp:  [97.7 F (36.5 C)-98.9 F (37.2 C)] 97.7 F (36.5 C) (07/03 0809) Pulse Rate:  [57-128] 70 (07/03 0809) Resp:  [16-20] 16 (07/03 0809) BP: (101-147)/(62-103) 114/76 (07/03 0809) SpO2:  [96 %-100 %] 98 % (07/03 0809) Weight:  [79.8 kg] 79.8 kg (07/02 1201)   General:   Alert,  Well-developed, well-nourished, white female pleasant and cooperative in NAD Head:  Normocephalic and atraumatic. Eyes:  Sclera clear, no icterus.   Conjunctiva pink. Ears:  Normal auditory acuity. Nose:  No deformity, discharge,  or lesions. Mouth:  No deformity or lesions.   Neck:  Supple; no masses or thyromegaly. Lungs:  Clear throughout to auscultation.   No wheezes, crackles, or rhonchi.  Heart:  Regular rate and rhythm; no murmurs, clicks, rubs,  or gallops. Abdomen:  Soft, mild generalized abdominal tenderness, no distention, no guarding or rebound, BS active,nonpalp mass or hsm.   Rectal: Not done Msk:  Symmetrical without gross deformities. . Pulses:  Normal pulses noted. Extremities:  Without clubbing or edema. Neurologic:  Alert and  oriented x4;  grossly normal neurologically. Skin:  Intact without significant lesions or rashes.. Psych:  Alert and cooperative. Normal mood and affect.  Intake/Output from previous day: No intake/output  data recorded. Intake/Output this shift: No intake/output data recorded.  Lab Results: Recent Labs  09/30/23 1422 09/30/23 1431 09/30/23 2017 10/01/23 0500  WBC 11.2*  --  9.3 7.9  HGB 16.9* 16.3* 15.3* 14.5  HCT 48.6* 48.0* 44.5 42.1  PLT 253  --  237 216   BMET Recent Labs    09/30/23 1422 09/30/23 1431 09/30/23 2017 10/01/23 0500  NA 139 139  --  136  K 3.7 3.7  --  3.3*  CL 108 105  --  109  CO2 20*  --   --  18*  GLUCOSE 85 90  --  93  BUN 9 9  --  <5*  CREATININE 0.80 0.70 0.71 1.04*  CALCIUM 8.9  --   --  8.2*   LFT Recent Labs    10/01/23 0500  PROT 5.6*  ALBUMIN 2.9*  AST 15  ALT 12  ALKPHOS 52  BILITOT 1.1   PT/INR No results for input(s): LABPROT, INR in the last 72 hours. Hepatitis Panel No results for input(s): HEPBSAG, HCVAB, HEPAIGM, HEPBIGM in the last 72 hours.    IMPRESSION:  #66 38 year old white female with acute illness onset 2 days ago with abdominal cramping, diarrhea which eventually became watery and persists, nausea vomiting and subjective spells of feeling hot  CT shows an acute ileitis with multiple thickened hyperemic loops of small bowel in the pelvis  Labs on admission with mild leukocytosis and evidence of hemoconcentration with elevated hemoglobin at 16.9  Patient did have 2 other episode of acute ileitis documented on a CT in 2021 and 2023  She had colonoscopy in 2021 after the initial episode and there was no evidence of IBD, normal colon and TI  Likely this episode is an acute infectious enteritis, consider underlying food allergy reciprocating recurrent episodes, will rule out alpha gal, rule out other intermittent worse for intestinal angioedema  PLAN: Advance to soft diet Continue Bentyl  10 mg every 6 hours as needed Patient asked for an oral pain medication rather than Dilaudid , have ordered hydrocodone  Await GI path panel Check alpha gal  GI will follow with you, and we can arrange for  outpatient follow-up with Dr. Leigh.    Demonie Kassa PA-C 10/01/2023, 11:15 AM

## 2023-10-01 NOTE — Progress Notes (Signed)
 PROGRESS NOTE    Abigail James  FMW:[id number] DOB: 09/16/1985 DOA: 09/30/2023 PCP: Franchot Lauraine Quant, MD (Inactive)   37/F, works as a Engineer, site in neurosurgery clinic, history of intermittent episodes of abdominal pain and constipation, back in 2020 she was seen in the ER and was suspected to have diverticulitis, however-CT more suggestive of ileitis, after this in 2021 underwent a colonoscopy which was normal, since then has had few more episodes where she has abdominal pain and constipation, subjectively got better with different courses of antibiotics.  Has been doing well recently until yesterday developed severe abdominal pain cramps and ongoing diarrhea which has now become watery.  Denies fevers, some report of chills, ate a frozen burger yesterday and she thinks her symptoms started after this. ED Course: In the ER tachycardic lab, labs suggestive of hemoconcentration, CT with thickened inflamed small bowel loops suggestive of enteritis  Subjective: Still continues to have some cramps and discomfort, diarrhea appears to be slowing  Assessment and Plan:  Gastroenteritis - In the background of recurrent episodes of ileitis and enteritis since 2020. - Colonoscopy 1/21 was unremarkable - CT this time suggestive of enteritis, could be exacerbated by viral illness, but unclear if she has an underlying disorder - FU GI pathogen panel, supportive care with bowel rest, antiemetics,, advance diet - Await GI eval - Also suggested trial of elimination diet, check vitamin D, crp     H/o Migraines     DVT prophylaxis: lovenox  Code Status: Full Code  Disposition Plan: Home pending improvement in symptoms?  Tomorrow   Objective: Vitals:   10/01/23 0400 10/01/23 0727 10/01/23 0809 10/01/23 1212  BP: 101/73 122/74 114/76 (!) 105/52  Pulse: (!) 57 77 70 62  Resp:  16 16   Temp:  97.9 F (36.6 C) 97.7 F (36.5 C) 97.9 F (36.6 C)  TempSrc:   Oral   SpO2: 98% 100% 98%  97%  Weight:      Height:       No intake or output data in the 24 hours ending 10/01/23 1219 Filed Weights   09/30/23 1201  Weight: 79.8 kg    Examination:  General exam: Appears calm and comfortable  Respiratory system: Clear to auscultation Cardiovascular system: S1 & S2 heard, RRR.  Abd: nondistended, soft and nontender.Normal bowel sounds heard. Central nervous system: Alert and oriented. No focal neurological deficits. Extremities: no edema Skin: No rashes Psychiatry:  Mood & affect appropriate.     Data Reviewed:   CBC: Recent Labs  Lab 09/30/23 1422 09/30/23 1431 09/30/23 2017 10/01/23 0500  WBC 11.2*  --  9.3 7.9  NEUTROABS 9.3*  --   --   --   HGB 16.9* 16.3* 15.3* 14.5  HCT 48.6* 48.0* 44.5 42.1  MCV 82.5  --  82.7 83.2  PLT 253  --  237 216   Basic Metabolic Panel: Recent Labs  Lab 09/30/23 1422 09/30/23 1431 09/30/23 2017 10/01/23 0500  NA 139 139  --  136  K 3.7 3.7  --  3.3*  CL 108 105  --  109  CO2 20*  --   --  18*  GLUCOSE 85 90  --  93  BUN 9 9  --  <5*  CREATININE 0.80 0.70 0.71 1.04*  CALCIUM 8.9  --   --  8.2*   GFR: Estimated Creatinine Clearance: 76.5 mL/min (A) (by C-G formula based on SCr of 1.04 mg/dL (H)). Liver Function Tests: Recent Labs  Lab  09/30/23 1422 10/01/23 0500  AST 22 15  ALT 12 12  ALKPHOS 61 52  BILITOT 1.2 1.1  PROT 6.5 5.6*  ALBUMIN 3.2* 2.9*   Recent Labs  Lab 09/30/23 1422  LIPASE 25   No results for input(s): AMMONIA in the last 168 hours. Coagulation Profile: No results for input(s): INR, PROTIME in the last 168 hours. Cardiac Enzymes: No results for input(s): CKTOTAL, CKMB, CKMBINDEX, TROPONINI in the last 168 hours. BNP (last 3 results) No results for input(s): PROBNP in the last 8760 hours. HbA1C: No results for input(s): HGBA1C in the last 72 hours. CBG: No results for input(s): GLUCAP in the last 168 hours. Lipid Profile: No results for input(s): CHOL,  HDL, LDLCALC, TRIG, CHOLHDL, LDLDIRECT in the last 72 hours. Thyroid Function Tests: No results for input(s): TSH, T4TOTAL, FREET4, T3FREE, THYROIDAB in the last 72 hours. Anemia Panel: No results for input(s): VITAMINB12, FOLATE, FERRITIN, TIBC, IRON, RETICCTPCT in the last 72 hours. Urine analysis:    Component Value Date/Time   COLORURINE [place] (A) 09/30/2023 1304   APPEARANCEUR HAZY (A) 09/30/2023 1304   LABSPEC 1.030 09/30/2023 1304   PHURINE 5.0 09/30/2023 1304   GLUCOSEU NEGATIVE 09/30/2023 1304   GLUCOSEU NEGATIVE 08/22/2021 1413   HGBUR NEGATIVE 09/30/2023 1304   BILIRUBINUR NEGATIVE 09/30/2023 1304   KETONESUR NEGATIVE 09/30/2023 1304   PROTEINUR 30 (A) 09/30/2023 1304   UROBILINOGEN 0.2 08/22/2021 1413   NITRITE NEGATIVE 09/30/2023 1304   LEUKOCYTESUR SMALL (A) 09/30/2023 1304   Sepsis Labs: @LABRCNTIP (procalcitonin:4,lacticidven:4)  )No results found for this or any previous visit (from the past 240 hours).   Radiology Studies: CT ABDOMEN PELVIS W CONTRAST Result Date: 09/30/2023 CLINICAL DATA:  Abdominal pain. EXAM: CT ABDOMEN AND PELVIS WITH CONTRAST TECHNIQUE: Multidetector CT imaging of the abdomen and pelvis was performed using the standard protocol following bolus administration of intravenous contrast. RADIATION DOSE REDUCTION: This exam was performed according to the departmental dose-optimization program which includes automated exposure control, adjustment of the mA and/or kV according to patient size and/or use of iterative reconstruction technique. CONTRAST:  75mL OMNIPAQUE  IOHEXOL  350 MG/ML SOLN COMPARISON:  08/12/2021 FINDINGS: Lower chest: No acute abnormality. Hepatobiliary: Stable focal fat deposition in the liver adjacent to the falciform ligament. Status post cholecystectomy. No biliary dilatation. Pancreas: Unremarkable. No pancreatic ductal dilatation or surrounding inflammatory changes. Spleen: Normal in size without focal  abnormality. Adrenals/Urinary Tract: Adrenal glands are unremarkable. Kidneys are normal, without renal calculi, focal lesion, or hydronephrosis. Bladder is decompressed. Stomach/Bowel: Multiple thickened, hyperemic and mildly dilated small bowel loops in the pelvis containing fluid. Associated small amount of free fluid in the pelvis. No overt bowel obstruction or free intraperitoneal air. Appearance is suggestive of enteritis with potential for early partial small bowel obstruction. The appendix is surgically absent. Vascular/Lymphatic: No significant vascular findings are present. No enlarged abdominal or pelvic lymph nodes. Reproductive: Uterus and bilateral adnexa are unremarkable. Other: No hernias.  No focal abscess identified Musculoskeletal: No acute or significant osseous findings. IMPRESSION: 1. Multiple thickened, hyperemic and mildly dilated small bowel loops in the pelvis containing fluid. Associated small amount of free fluid in the pelvis. Appearance is suggestive of enteritis with potential for early partial small bowel obstruction. No overt bowel obstruction or free intraperitoneal air. 2. Status post cholecystectomy and appendectomy. Electronically Signed   By: Marcey Moan M.D.   On: 09/30/2023 16:06     Scheduled Meds:  enoxaparin  (LOVENOX ) injection  40 mg Subcutaneous Q24H   pantoprazole  40 mg Oral Daily   Continuous Infusions:  sodium chloride  75 mL/hr at 10/01/23 0816     LOS: 0 days    Time spent:    Sigurd Pac, MD [facility]   10/01/2023, 12:19 PM

## 2023-10-02 ENCOUNTER — Observation Stay (HOSPITAL_COMMUNITY)

## 2023-10-02 DIAGNOSIS — R109 Unspecified abdominal pain: Secondary | ICD-10-CM | POA: Diagnosis not present

## 2023-10-02 DIAGNOSIS — R933 Abnormal findings on diagnostic imaging of other parts of digestive tract: Secondary | ICD-10-CM | POA: Diagnosis not present

## 2023-10-02 DIAGNOSIS — K529 Noninfective gastroenteritis and colitis, unspecified: Secondary | ICD-10-CM | POA: Diagnosis not present

## 2023-10-02 MED ORDER — IOHEXOL 350 MG/ML SOLN
100.0000 mL | Freq: Once | INTRAVENOUS | Status: AC | PRN
Start: 2023-10-02 — End: 2023-10-02
  Administered 2023-10-02: 100 mL via INTRAVENOUS

## 2023-10-02 MED ORDER — DICYCLOMINE HCL 10 MG PO CAPS: 10.0000 mg | ORAL_CAPSULE | Freq: Three times a day (TID) | ORAL | 0 refills | Status: AC | PRN

## 2023-10-02 NOTE — Care Management (Signed)
  Transition of Care [facility]) Screening Note   Patient Details  Name: Abigail James Date of Birth: [birth year]   Transition of Care [facility]) CM/SW Contact:    Corean JAYSON Canary, RN Phone Number: 10/02/2023, 8:35 AM    Transition of Care Department [facility]) has reviewed patient and no TOC needs have been identified at this time. We will continue to monitor patient advancement through interdisciplinary progression rounds. If new patient transition needs arise, please place a TOC consult.

## 2023-10-02 NOTE — Plan of Care (Signed)

## 2023-10-02 NOTE — Progress Notes (Signed)
 PROGRESS NOTE    Abigail James  FMW:[id number] DOB: 09-22-1985 DOA: 09/30/2023 PCP: Franchot Lauraine Quant, MD (Inactive)   37/F, works as a Engineer, site in neurosurgery clinic, history of intermittent episodes of abdominal pain and constipation, back in 2020 she was seen in the ER and was suspected to have diverticulitis, however-CT more suggestive of ileitis, after this in 2021 underwent a colonoscopy which was normal, since then has had few more episodes where she has abdominal pain and constipation, subjectively got better with different courses of antibiotics.  Has been doing well recently until yesterday developed severe abdominal pain cramps and ongoing diarrhea which has now become watery.  Denies fevers, some report of chills, ate a frozen burger yesterday and she thinks her symptoms started after this. ED Course: In the ER tachycardic lab, labs suggestive of hemoconcentration, CT with thickened inflamed small bowel loops suggestive of enteritis  Subjective: Still continues to have some cramps and discomfort, diarrhea appears to be slowing  Assessment and Plan:  Enteritis - In the background of recurrent episodes of ileitis and enteritis since 2020. - Colonoscopy 1/21 was unremarkable - CT this time suggestive of enteritis, could be exacerbated by viral illness, but unclear if she has an underlying disorder - GI pathogen panel is neg, improving, diet advanced, GI following checking serology, alpha gal -? Home today     H/o Migraines     DVT prophylaxis: lovenox  Code Status: Full Code  Disposition Plan: Home later today pending Gi eval   Objective: Vitals:   10/01/23 1212 10/01/23 2018 10/02/23 0427 10/02/23 0708  BP: (!) 105/52 (!) 105/59 111/78 116/73  Pulse: 62 64 71   Resp:  18 18 18   Temp: 97.9 F (36.6 C) (!) 97.5 F (36.4 C) 98 F (36.7 C) 98.2 F (36.8 C)  TempSrc:  Oral Oral Oral  SpO2: 97% 99% 100% 100%  Weight:      Height:         Intake/Output Summary (Last 24 hours) at 10/02/2023 1243 Last data filed at 10/02/2023 0600 Gross per 24 hour  Intake 220 ml  Output 0 ml  Net 220 ml   Filed Weights   09/30/23 1201  Weight: 79.8 kg    Examination:  General exam: Appears calm and comfortable  Respiratory system: Clear to auscultation Cardiovascular system: S1 & S2 heard, RRR.  Abd: nondistended, soft and nontender.Normal bowel sounds heard. Central nervous system: Alert and oriented. No focal neurological deficits. Extremities: no edema Skin: No rashes Psychiatry:  Mood & affect appropriate.     Data Reviewed:   CBC: Recent Labs  Lab 09/30/23 1422 09/30/23 1431 09/30/23 2017 10/01/23 0500  WBC 11.2*  --  9.3 7.9  NEUTROABS 9.3*  --   --   --   HGB 16.9* 16.3* 15.3* 14.5  HCT 48.6* 48.0* 44.5 42.1  MCV 82.5  --  82.7 83.2  PLT 253  --  237 216   Basic Metabolic Panel: Recent Labs  Lab 09/30/23 1422 09/30/23 1431 09/30/23 2017 10/01/23 0500  NA 139 139  --  136  K 3.7 3.7  --  3.3*  CL 108 105  --  109  CO2 20*  --   --  18*  GLUCOSE 85 90  --  93  BUN 9 9  --  <5*  CREATININE 0.80 0.70 0.71 1.04*  CALCIUM 8.9  --   --  8.2*   GFR: Estimated Creatinine Clearance: 76.5 mL/min (A) (by C-G formula  based on SCr of 1.04 mg/dL (H)). Liver Function Tests: Recent Labs  Lab 09/30/23 1422 10/01/23 0500  AST 22 15  ALT 12 12  ALKPHOS 61 52  BILITOT 1.2 1.1  PROT 6.5 5.6*  ALBUMIN 3.2* 2.9*   Recent Labs  Lab 09/30/23 1422  LIPASE 25   No results for input(s): AMMONIA in the last 168 hours. Coagulation Profile: No results for input(s): INR, PROTIME in the last 168 hours. Cardiac Enzymes: No results for input(s): CKTOTAL, CKMB, CKMBINDEX, TROPONINI in the last 168 hours. BNP (last 3 results) No results for input(s): PROBNP in the last 8760 hours. HbA1C: No results for input(s): HGBA1C in the last 72 hours. CBG: No results for input(s): GLUCAP in the last 168  hours. Lipid Profile: No results for input(s): CHOL, HDL, LDLCALC, TRIG, CHOLHDL, LDLDIRECT in the last 72 hours. Thyroid Function Tests: No results for input(s): TSH, T4TOTAL, FREET4, T3FREE, THYROIDAB in the last 72 hours. Anemia Panel: No results for input(s): VITAMINB12, FOLATE, FERRITIN, TIBC, IRON, RETICCTPCT in the last 72 hours. Urine analysis:    Component Value Date/Time   COLORURINE Mescal (A) 09/30/2023 1304   APPEARANCEUR HAZY (A) 09/30/2023 1304   LABSPEC 1.030 09/30/2023 1304   PHURINE 5.0 09/30/2023 1304   GLUCOSEU NEGATIVE 09/30/2023 1304   GLUCOSEU NEGATIVE 08/22/2021 1413   HGBUR NEGATIVE 09/30/2023 1304   BILIRUBINUR NEGATIVE 09/30/2023 1304   KETONESUR NEGATIVE 09/30/2023 1304   PROTEINUR 30 (A) 09/30/2023 1304   UROBILINOGEN 0.2 08/22/2021 1413   NITRITE NEGATIVE 09/30/2023 1304   LEUKOCYTESUR SMALL (A) 09/30/2023 1304   Sepsis Labs: @LABRCNTIP (procalcitonin:4,lacticidven:4)  ) Recent Results (from the past 240 hours)  Gastrointestinal Panel by PCR , Stool     Status: None   Collection Time: 09/30/23  5:38 PM   Specimen: Stool  Result Value Ref Range Status   Campylobacter species NOT DETECTED NOT DETECTED Final   Plesimonas shigelloides NOT DETECTED NOT DETECTED Final   Salmonella species NOT DETECTED NOT DETECTED Final   Yersinia enterocolitica NOT DETECTED NOT DETECTED Final   Vibrio species NOT DETECTED NOT DETECTED Final   Vibrio cholerae NOT DETECTED NOT DETECTED Final   Enteroaggregative E coli (EAEC) NOT DETECTED NOT DETECTED Final   Enteropathogenic E coli (EPEC) NOT DETECTED NOT DETECTED Final   Enterotoxigenic E coli (ETEC) NOT DETECTED NOT DETECTED Final   Shiga like toxin producing E coli (STEC) NOT DETECTED NOT DETECTED Final   Shigella/Enteroinvasive E coli (EIEC) NOT DETECTED NOT DETECTED Final   Cryptosporidium NOT DETECTED NOT DETECTED Final   Cyclospora cayetanensis NOT DETECTED NOT DETECTED Final    Entamoeba histolytica NOT DETECTED NOT DETECTED Final   Giardia lamblia NOT DETECTED NOT DETECTED Final   Adenovirus F40/41 NOT DETECTED NOT DETECTED Final   Astrovirus NOT DETECTED NOT DETECTED Final   Norovirus GI/GII NOT DETECTED NOT DETECTED Final   Rotavirus A NOT DETECTED NOT DETECTED Final   Sapovirus (I, II, IV, and V) NOT DETECTED NOT DETECTED Final    Comment: Performed at [facility], [address] [place], [place] [address]     Radiology Studies: CT ABDOMEN PELVIS W CONTRAST Result Date: 09/30/2023 CLINICAL DATA:  Abdominal pain. EXAM: CT ABDOMEN AND PELVIS WITH CONTRAST TECHNIQUE: Multidetector CT imaging of the abdomen and pelvis was performed using the standard protocol following bolus administration of intravenous contrast. RADIATION DOSE REDUCTION: This exam was performed according to the departmental dose-optimization program which includes automated exposure control, adjustment of the mA and/or kV according  to patient size and/or use of iterative reconstruction technique. CONTRAST:  75mL OMNIPAQUE  IOHEXOL  350 MG/ML SOLN COMPARISON:  08/12/2021 FINDINGS: Lower chest: No acute abnormality. Hepatobiliary: Stable focal fat deposition in the liver adjacent to the falciform ligament. Status post cholecystectomy. No biliary dilatation. Pancreas: Unremarkable. No pancreatic ductal dilatation or surrounding inflammatory changes. Spleen: Normal in size without focal abnormality. Adrenals/Urinary Tract: Adrenal glands are unremarkable. Kidneys are normal, without renal calculi, focal lesion, or hydronephrosis. Bladder is decompressed. Stomach/Bowel: Multiple thickened, hyperemic and mildly dilated small bowel loops in the pelvis containing fluid. Associated small amount of free fluid in the pelvis. No overt bowel obstruction or free intraperitoneal air. Appearance is suggestive of enteritis with potential for early partial small bowel obstruction. The appendix is surgically absent.  Vascular/Lymphatic: No significant vascular findings are present. No enlarged abdominal or pelvic lymph nodes. Reproductive: Uterus and bilateral adnexa are unremarkable. Other: No hernias.  No focal abscess identified Musculoskeletal: No acute or significant osseous findings. IMPRESSION: 1. Multiple thickened, hyperemic and mildly dilated small bowel loops in the pelvis containing fluid. Associated small amount of free fluid in the pelvis. Appearance is suggestive of enteritis with potential for early partial small bowel obstruction. No overt bowel obstruction or free intraperitoneal air. 2. Status post cholecystectomy and appendectomy. Electronically Signed   By: Marcey Moan M.D.   On: 09/30/2023 16:06     Scheduled Meds:  enoxaparin  (LOVENOX ) injection  40 mg Subcutaneous Q24H   pantoprazole   40 mg Oral Daily   Continuous Infusions:     LOS: 0 days    Time spent:    Sigurd Pac, MD [facility]   10/02/2023, 12:43 PM

## 2023-10-02 NOTE — Progress Notes (Addendum)
 Gastroenterology Inpatient Follow Up    Subjective: Diarrhea has improved.  She still has some nausea.  Has been able to eat some food but this seems to cause worsening in her abdominal pain  Objective: Vital signs in last 24 hours: Temp:  [97.5 F (36.4 C)-98.2 F (36.8 C)] 98.2 F (36.8 C) (07/04 0708) Pulse Rate:  [64-71] 71 (07/04 0427) Resp:  [18] 18 (07/04 0708) BP: (105-116)/(59-78) 116/73 (07/04 0708) SpO2:  [99 %-100 %] 100 % (07/04 0708) Last BM Date : 10/01/23  Intake/Output from previous day: 07/03 0701 - 07/04 0700 In: 220 [P.O.:220] Out: 0  Intake/Output this shift: No intake/output data recorded.  General appearance: alert and cooperative Resp: no increased WOB Cardio: regular rate GI: Tender mostly in the right upper quadrant and left lower quadrant  Lab Results: Recent Labs    09/30/23 1422 09/30/23 1431 09/30/23 2017 10/01/23 0500  WBC 11.2*  --  9.3 7.9  HGB 16.9* 16.3* 15.3* 14.5  HCT 48.6* 48.0* 44.5 42.1  PLT 253  --  237 216   BMET Recent Labs    09/30/23 1422 09/30/23 1431 09/30/23 2017 10/01/23 0500  NA 139 139  --  136  K 3.7 3.7  --  3.3*  CL 108 105  --  109  CO2 20*  --   --  18*  GLUCOSE 85 90  --  93  BUN 9 9  --  <5*  CREATININE 0.80 0.70 0.71 1.04*  CALCIUM 8.9  --   --  8.2*   LFT Recent Labs    10/01/23 0500  PROT 5.6*  ALBUMIN 2.9*  AST 15  ALT 12  ALKPHOS 52  BILITOT 1.1   PT/INR No results for input(s): LABPROT, INR in the last 72 hours. Hepatitis Panel No results for input(s): HEPBSAG, HCVAB, HEPAIGM, HEPBIGM in the last 72 hours. C-Diff No results for input(s): CDIFFTOX in the last 72 hours.  Studies/Results: CT ENTERO ABD/PELVIS W CONTAST Result Date: 10/02/2023 CLINICAL DATA:  Abdominal pain, diarrhea, enteritis EXAM: CT ABDOMEN AND PELVIS WITH CONTRAST (ENTEROGRAPHY) TECHNIQUE: Multidetector CT of the abdomen and pelvis during bolus administration of intravenous contrast.  Negative oral contrast was given. RADIATION DOSE REDUCTION: This exam was performed according to the departmental dose-optimization program which includes automated exposure control, adjustment of the mA and/or kV according to patient size and/or use of iterative reconstruction technique. CONTRAST:  OMNIPAQUE  IOHEXOL  350 MG/ML SOLN COMPARISON:  09/30/2022 FINDINGS: Lower chest: No acute abnormality. Hepatobiliary: No solid liver abnormality is seen. Focal fatty deposition adjacent to the falciform ligament, characteristic in appearance and location, benign, requiring no further follow-up or characterization. Status post cholecystectomy. No biliary dilatation. Pancreas: Unremarkable. No pancreatic ductal dilatation or surrounding inflammatory changes. Spleen: Normal in size without significant abnormality. Adrenals/Urinary Tract: Adrenal glands are unremarkable. Kidneys are normal, without renal calculi, solid lesion, or hydronephrosis. Bladder is unremarkable. Stomach/Bowel: Stomach is within normal limits. Appendectomy. The small bowel is well distended by negative oral enteric contrast. Mildly thickened and hyperenhancing loops of distal small bowel, similar to prior examination (series 3, image 123). Vascular/Lymphatic: No significant vascular findings are present. No enlarged abdominal or pelvic lymph nodes. Reproductive: No mass or other significant abnormality. Ring contraceptive in the vaginal vault. Other: No abdominal wall hernia or abnormality. Small volume free fluid in the low pelvis. Musculoskeletal: No acute or significant osseous findings. IMPRESSION: 1. Mildly thickened and hyperenhancing loops of distal small bowel, similar to prior examination, consistent with  nonspecific infectious or inflammatory enteritis. No specific stigmata of inflammatory bowel disease. No evidence of complicating stricture, obstruction, fistula, or abscess. 2. Small volume free fluid in the low pelvis, which may be  reactive or functional. 3. Status post cholecystectomy and appendectomy. Electronically Signed   By: Marolyn JONETTA Jaksch M.D.   On: 10/02/2023 14:54   CT ABDOMEN PELVIS W CONTRAST Result Date: 09/30/2023 CLINICAL DATA:  Abdominal pain. EXAM: CT ABDOMEN AND PELVIS WITH CONTRAST TECHNIQUE: Multidetector CT imaging of the abdomen and pelvis was performed using the standard protocol following bolus administration of intravenous contrast. RADIATION DOSE REDUCTION: This exam was performed according to the departmental dose-optimization program which includes automated exposure control, adjustment of the mA and/or kV according to patient size and/or use of iterative reconstruction technique. CONTRAST:  75mL OMNIPAQUE  IOHEXOL  350 MG/ML SOLN COMPARISON:  08/12/2021 FINDINGS: Lower chest: No acute abnormality. Hepatobiliary: Stable focal fat deposition in the liver adjacent to the falciform ligament. Status post cholecystectomy. No biliary dilatation. Pancreas: Unremarkable. No pancreatic ductal dilatation or surrounding inflammatory changes. Spleen: Normal in size without focal abnormality. Adrenals/Urinary Tract: Adrenal glands are unremarkable. Kidneys are normal, without renal calculi, focal lesion, or hydronephrosis. Bladder is decompressed. Stomach/Bowel: Multiple thickened, hyperemic and mildly dilated small bowel loops in the pelvis containing fluid. Associated small amount of free fluid in the pelvis. No overt bowel obstruction or free intraperitoneal air. Appearance is suggestive of enteritis with potential for early partial small bowel obstruction. The appendix is surgically absent. Vascular/Lymphatic: No significant vascular findings are present. No enlarged abdominal or pelvic lymph nodes. Reproductive: Uterus and bilateral adnexa are unremarkable. Other: No hernias.  No focal abscess identified Musculoskeletal: No acute or significant osseous findings. IMPRESSION: 1. Multiple thickened, hyperemic and mildly dilated  small bowel loops in the pelvis containing fluid. Associated small amount of free fluid in the pelvis. Appearance is suggestive of enteritis with potential for early partial small bowel obstruction. No overt bowel obstruction or free intraperitoneal air. 2. Status post cholecystectomy and appendectomy. Electronically Signed   By: Marcey Moan M.D.   On: 09/30/2023 16:06    Medications: I have reviewed the patient's current medications. Scheduled:  enoxaparin  (LOVENOX ) injection  40 mg Subcutaneous Q24H   pantoprazole   40 mg Oral Daily   Continuous: PRN:acetaminophen , dicyclomine , HYDROcodone -acetaminophen , HYDROmorphone  (DILAUDID ) injection, ondansetron  **OR** ondansetron  (ZOFRAN ) IV  Assessment/Plan: 38 year old female known to me from the outpatient clinic, coming in with acute onset abdominal pain and diarrhea. CT scan shows enteritis with question of early obstruction associated with inflammation.   Interestingly she has had 2 episodes of ileitis in 2021 in 2023. She had a follow-up colonoscopy which showed no evidence of IBD. In between episodes she is has had absolutely no symptoms and stable at baseline.   GI pathogen panel was negative.  After discussion with the patient, we opted to proceed with CT enterography for further evaluation.  This showed inflammation in the distal small bowel but did not show any other obvious clear signs of inflammatory bowel disease.  Patient's inflammatory CRP marker was elevated but this can be nonspecific for any sort of inflammation in the body.  Will check C4 and C1 esterase to evaluate for any signs of angioedema, which are currently pending.  Will follow-up results of alpha gal test.  Patient overall states that she is feeling better.  I did offer her the opportunity to get an EGD during this hospitalization to assess her proximal small bowel since she has not had an EGD  in the past.  Her prior TTG IgA was negative.  Patient declined to get an EGD as an  inpatient because she would like to get home.  Will try to get a fecal calprotectin collected before she leaves the hospital.  Patient's diarrhea has improved and she is still nauseous but not having any active vomiting.  We will go ahead and request outpatient GI follow-up in the event that the patient is discharged today.   LOS: 0 days   Rosario JAYSON Kidney 10/02/2023, 3:13 PM

## 2023-10-02 NOTE — Progress Notes (Signed)
 DISCHARGE NOTE HOME Abigail James Abigail James to be discharged Home per MD order. Discussed prescriptions and follow up appointments with the patient. Prescriptions given to patient; medication list explained in detail. Patient verbalized understanding.  Skin clean, dry and intact without evidence of skin break down, no evidence of skin tears noted. IV catheter discontinued intact. Site without signs and symptoms of complications. Dressing and pressure applied. Pt denies pain at the site currently. No complaints noted.  Patient free of lines, drains, and wounds.   An After Visit Summary (AVS) was printed and given to the patient. Patient escorted via wheelchair, and discharged home via private auto.  Doyal Sias, RN

## 2023-10-04 ENCOUNTER — Ambulatory Visit: Payer: Self-pay | Admitting: Internal Medicine

## 2023-10-04 LAB — C4 COMPLEMENT: Complement C4, Body Fluid: 23 mg/dL (ref 12–38)

## 2023-10-05 LAB — C1 ESTERASE INHIBITOR: C1INH SerPl-mCnc: 26 mg/dL (ref 21–39)

## 2023-10-05 MED ORDER — ONDANSETRON 4 MG PO TBDP
4.0000 mg | ORAL_TABLET | Freq: Four times a day (QID) | ORAL | 0 refills | Status: AC | PRN
Start: 2023-10-05 — End: ?

## 2023-10-06 NOTE — Telephone Encounter (Signed)
 Patient stated she is unable to come into appointment on 7/15 due to not being able to take any time off of work due to recent hospitalization. Patient is requesting to know if there is an alternative day available. Please advise, thank you.

## 2023-10-06 NOTE — Telephone Encounter (Signed)
 Can give Zofran  4mg  ODT every 6 hours PRN #30 RF1. Thanks

## 2023-10-06 NOTE — Telephone Encounter (Signed)
 Rx was reordered on 10/05/23

## 2023-10-08 ENCOUNTER — Encounter (HOSPITAL_COMMUNITY): Payer: Self-pay

## 2023-10-08 ENCOUNTER — Ambulatory Visit (HOSPITAL_COMMUNITY): Admission: EM | Admit: 2023-10-08 | Discharge: 2023-10-08 | Disposition: A | Discharge status: Home / Self Care

## 2023-10-08 DIAGNOSIS — I809 Phlebitis and thrombophlebitis of unspecified site: Secondary | ICD-10-CM | POA: Diagnosis not present

## 2023-10-08 DIAGNOSIS — L03113 Cellulitis of right upper limb: Secondary | ICD-10-CM

## 2023-10-08 LAB — ALPHA GALACTOSIDASE: Alpha galactosidase, serum: 96.7 nmol/h/mg{prot} (ref >35.5)

## 2023-10-08 MED ORDER — CEFTRIAXONE SODIUM 1 G IJ SOLR
INTRAMUSCULAR | Status: AC
  Filled 2023-10-08: qty 10

## 2023-10-08 MED ORDER — LIDOCAINE HCL (PF) 1 % IJ SOLN
INTRAMUSCULAR | Status: AC
  Filled 2023-10-08: qty 2

## 2023-10-08 MED ORDER — CEFTRIAXONE SODIUM 1 G IJ SOLR
1.0000 g | INTRAMUSCULAR | Status: DC
  Administered 2023-10-08: 1 g via INTRAMUSCULAR

## 2023-10-08 NOTE — ED Triage Notes (Signed)
 Patient was in the hospital last week and the IV in the right arm blew. Was given antibiotics but the arm continues to swell, be painful, and hot to the touch. Also feeling woozy and feeling unwell. Was unable to sleep last night.   Took Motrin  at 2 am this morning and tylenol  yesterday with mild relief.

## 2023-10-08 NOTE — Discharge Instructions (Signed)
 Continue Augmentin .  Warm compresses 20 minutes 4 times today.

## 2023-10-08 NOTE — ED Provider Notes (Signed)
 [facility]    CSN: [id number] Arrival date & time: 10/08/23  0805      History   Chief Complaint Chief Complaint  Patient presents with   Arm Pain    And swelling     HPI Abigail James is a 38 y.o. female.   Pt complains of swelling to her right arm.  Pt reports she had an IV that infiltrated  in her right arm while she was in the hospital. Pt complains of an area of redness.  Pt worried about cellulitis. Pt has a red area right arm.  Pt is on Augmentin .  Pt denies fever or chills   The history is provided by the patient and the spouse. No language interpreter was used.  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    Past Surgical History:  Procedure Laterality Date   APPENDECTOMY     CESAREAN SECTION N/A 12/19/2016   Procedure: CESAREAN SECTION;  Surgeon: Izell Harari, MD;  Location: [facility] BIRTHING SUITES;  Service: Obstetrics;  Laterality: N/A;   CHOLECYSTECTOMY     DILATION AND CURETTAGE OF UTERUS     FOOT SURGERY     LEG SURGERY     TYMPANOSTOMY TUBE PLACEMENT      OB History     Gravida  4   Para  3   Term  2   Preterm  1   AB  1   Living  3      SAB  1   IAB  0   Ectopic  0   Multiple  0   Live Births  3            Home Medications    Prior to Admission medications   Medication Sig Start Date End Date Taking? Authorizing Provider  amoxicillin -clavulanate (AUGMENTIN ) 500-125 MG tablet Take 1 tablet by mouth 2 (two) times daily. 10/07/23  Yes [provider]  dicyclomine  (BENTYL ) 10 MG capsule Take 1 capsule (10 mg  total) by mouth 3 (three) times daily as needed for spasms. Take 1 tablet 2-3 times daily as needed for abdominal pain/bloating. 10/02/23  Yes Fairy Frames, MD  etonogestrel -ethinyl estradiol  (NUVARING) 0.12-0.015 MG/24HR vaginal ring INSERT 1 RING VAGINALLY AS DIRECTED. REMOVE AFTER 3 WEEKS & WAIT 7 DAYS BEFORE INSERTING A NEW RING 05/22/23  Yes Stinson, Jacob J, DO  ondansetron  (ZOFRAN -ODT) 4 MG disintegrating tablet Take 1 tablet (4 mg total) by mouth every 6 (six) hours as needed for nausea or vomiting. 10/05/23  Yes Armbruster, Elspeth SQUIBB, MD  QULIPTA 60 MG TABS Take 1 tablet by mouth daily.   Yes [provider]    Family History Family History  Problem Relation Age of Onset   Hypertension Father    Hypertension Brother    Suicidality Brother    Hypertension Maternal Uncle    Cancer Maternal Grandmother    Hypertension Paternal Grandmother    Diabetes Paternal Grandmother    Cancer Paternal Grandmother    Colon cancer Neg Hx    Colon polyps Neg  Hx    Esophageal cancer Neg Hx    Rectal cancer Neg Hx    Stomach cancer Neg Hx     Social History Social History   Tobacco Use   Smoking status: Former    Current packs/day: 0.00    Types: Cigarettes    Quit date: 04/08/2014    Years since quitting: 9.5   Smokeless tobacco: Never  Vaping Use   Vaping status: Never Used  Substance Use Topics   Alcohol use: Not Currently   Drug use: No     Allergies   Ciprofloxacin, Isosorbide, Latex, Vancomycin, and Sulfa antibiotics   Review of Systems Review of Systems  Skin:  Positive for color change. Negative for wound.  All other systems reviewed and are negative.    Physical Exam Triage Vital Signs ED Triage Vitals  Encounter Vitals Group     BP 10/08/23 0835 136/89     Girls Systolic BP Percentile --      Girls Diastolic BP Percentile --      Boys Systolic BP Percentile --      Boys Diastolic BP Percentile --      Pulse Rate 10/08/23 0835 87     Resp 10/08/23 0835  18     Temp 10/08/23 0835 98.2 F (36.8 C)     Temp Source 10/08/23 0835 Oral     SpO2 10/08/23 0835 97 %     Weight 10/08/23 0834 174 lb (78.9 kg)     Height 10/08/23 0834 5' 5 (1.651 m)     Head Circumference --      Peak Flow --      Pain Score 10/08/23 0833 6     Pain Loc --      Pain Education --      Exclude from Growth Chart --    No data found.  Updated Vital Signs BP 136/89 (BP Location: Left Arm)   Pulse 87   Temp 98.2 F (36.8 C) (Oral)   Resp 18   Ht 5' 5 (1.651 m)   Wt 78.9 kg   LMP  (LMP Unknown)   SpO2 97%   BMI 28.96 kg/m   Visual Acuity Right Eye Distance:   Left Eye Distance:   Bilateral Distance:    Right Eye Near:   Left Eye Near:    Bilateral Near:     Physical Exam Vitals reviewed.  Cardiovascular:     Rate and Rhythm: Normal rate.  Pulmonary:     Effort: Pulmonary effort is normal.  Musculoskeletal:        General: Swelling and tenderness present.     Comments: 15x20 cm area of erythema,  warm to touch medial right antecubital area  from nv and ns intact    Skin:    Findings: Erythema present.  Neurological:     General: No focal deficit present.     Mental Status: She is alert.  Psychiatric:        Mood and Affect: Mood normal.      UC Treatments / Results  Labs (all labs ordered are listed, but only abnormal results are displayed) Labs Reviewed - No data to display  EKG   Radiology No results found.  Procedures Procedures (including critical care time)  Medications Ordered in UC Medications  cefTRIAXone  (ROCEPHIN ) injection 1 g (has no administration in time range)    Initial Impression / Assessment and Plan / UC Course  I have reviewed the triage vital signs and  the nursing notes.  Pertinent labs & imaging results that were available during my care of the patient were reviewed by me and considered in my medical decision making (see chart for details).     Pt may have developing cellulitis vs phlebitis. I  will give injection of Rocephin .  Pt advised warm compresses.  Pt advised if increased redness or fever, go to ED.    Final diagnoses:  Thrombophlebitis  Cellulitis of right arm     Discharge Instructions      Continue Augmentin .  Warm compresses 20 minutes 4 times today.     ED Prescriptions   None    PDMP not reviewed this encounter.   Flint Sonny POUR, PA-C 10/08/23 [phone]

## 2023-10-13 ENCOUNTER — Ambulatory Visit: Admitting: Physician Assistant

## 2023-10-20 ENCOUNTER — Ambulatory Visit: Admitting: Physician Assistant

## 2023-10-20 NOTE — Progress Notes (Deleted)
 Ellouise Console, PA-C [address] [place], [place]  [address] Phone: [phone]   Primary Care Physician: Franchot Lauraine Quant, MD (Inactive)  Primary Gastroenterologist:  Ellouise Console, PA-C / ***  Chief Complaint: Hospital follow-up enteritis       HPI:   Abigail James is a 38 y.o. female returns for hospital follow-up of acute enteritis.    09/30/2023  ED visit with hospital admission for acute 2-day history of abdominal cramping and diarrhea.  Abdominal pelvic CT showed thickened inflamed small bowel loops suggestive of enteritis.  Admitted to hospital.  C. difficile negative.  GI pathogen panel negative.  CRP mildly elevated.  C4 and C1 esterase normal.  Alpha gal negative.  HIV nonreactive.  Normal WBC 7.9, Hgb 14.5.  Mild low potassium 3.3, BUN less than 5, creatinine 1.04.  Normal LFTs and lipase.  hCG negative.  Discharged home on Augmentin  to treat presumed infectious enteritis.  Also given Bentyl  and Zofran  as needed.  Current symptoms:  04/2019 colonoscopy by Dr. Leigh:  Normal colon.  Normal terminal ileum.  Small internal hemorrhoids.  Good prep.  No evidence of inflammatory bowel disease.  Treated with Bentyl  for abdominal cramps.  History of intermittent episodes of abdominal pain and constipation.  In 2020 she was seen in the ER and suspected to have ileitis.  Previous cholecystectomy, appendectomy, C-section, D&C.  Works as a Engineer, site in neurosurgery clinic.  7//25 CT abdomen pelvis enterography with contrast: IMPRESSION: 1. Mildly thickened and hyperenhancing loops of distal small bowel, similar to prior examination, consistent with nonspecific infectious or inflammatory enteritis. No specific stigmata of inflammatory bowel disease. No evidence of complicating stricture, obstruction, fistula, or abscess. 2. Small volume free fluid in the low pelvis, which may be reactive or functional. 3. Status post cholecystectomy and  appendectomy.   Current Outpatient Medications  Medication Sig Dispense Refill   amoxicillin -clavulanate (AUGMENTIN ) 500-125 MG tablet Take 1 tablet by mouth 2 (two) times daily.     dicyclomine  (BENTYL ) 10 MG capsule Take 1 capsule (10 mg total) by mouth 3 (three) times daily as needed for spasms. Take 1 tablet 2-3 times daily as needed for abdominal pain/bloating. 30 capsule 0   etonogestrel -ethinyl estradiol  (NUVARING) 0.12-0.015 MG/24HR vaginal ring INSERT 1 RING VAGINALLY AS DIRECTED. REMOVE AFTER 3 WEEKS & WAIT 7 DAYS BEFORE INSERTING A NEW RING 3 each 3   ondansetron  (ZOFRAN -ODT) 4 MG disintegrating tablet Take 1 tablet (4 mg total) by mouth every 6 (six) hours as needed for nausea or vomiting. 30 tablet 0   QULIPTA 60 MG TABS Take 1 tablet by mouth daily.     No current facility-administered medications for this visit.    Allergies as of 10/20/2023 - Review Complete 10/08/2023  Allergen Reaction Noted   Ciprofloxacin Anaphylaxis 07/26/2013   Isosorbide Other (See Comments) 07/05/2020   Latex Swelling 05/06/2016   Vancomycin Nausea And Vomiting 05/06/2016   Sulfa antibiotics Swelling and Rash 07/26/2013    Past Medical History:  Diagnosis Date   Anxiety    Asthma    Depression    Diverticulitis    Ileitis     Past Surgical History:  Procedure Laterality Date   APPENDECTOMY     CESAREAN SECTION N/A 12/19/2016   Procedure: CESAREAN SECTION;  Surgeon: Izell Harari, MD;  Location: [facility] BIRTHING SUITES;  Service: Obstetrics;  Laterality: N/A;   CHOLECYSTECTOMY     DILATION AND CURETTAGE OF UTERUS     FOOT SURGERY  LEG SURGERY     TYMPANOSTOMY TUBE PLACEMENT      Review of Systems:    All systems reviewed and negative except where noted in HPI.    Physical Exam:  LMP  (LMP Unknown)  No LMP recorded (lmp unknown). (Menstrual status: Other).  General: Well-nourished, well-developed in no acute distress.  Lungs: Clear to auscultation bilaterally.  Non-labored. Heart: Regular rate and rhythm, no murmurs rubs or gallops.  Abdomen: Bowel sounds are normal; Abdomen is Soft; No hepatosplenomegaly, masses or hernias;  No Abdominal Tenderness; No guarding or rebound tenderness. Neuro: Alert and oriented x 3.  Grossly intact.  Psych: Alert and cooperative, normal mood and affect.   Imaging Studies: CT ENTERO ABD/PELVIS W CONTAST Result Date: 10/02/2023 CLINICAL DATA:  Abdominal pain, diarrhea, enteritis EXAM: CT ABDOMEN AND PELVIS WITH CONTRAST (ENTEROGRAPHY) TECHNIQUE: Multidetector CT of the abdomen and pelvis during bolus administration of intravenous contrast. Negative oral contrast was given. RADIATION DOSE REDUCTION: This exam was performed according to the departmental dose-optimization program which includes automated exposure control, adjustment of the mA and/or kV according to patient size and/or use of iterative reconstruction technique. CONTRAST:  OMNIPAQUE  IOHEXOL  350 MG/ML SOLN COMPARISON:  09/30/2022 FINDINGS: Lower chest: No acute abnormality. Hepatobiliary: No solid liver abnormality is seen. Focal fatty deposition adjacent to the falciform ligament, characteristic in appearance and location, benign, requiring no further follow-up or characterization. Status post cholecystectomy. No biliary dilatation. Pancreas: Unremarkable. No pancreatic ductal dilatation or surrounding inflammatory changes. Spleen: Normal in size without significant abnormality. Adrenals/Urinary Tract: Adrenal glands are unremarkable. Kidneys are normal, without renal calculi, solid lesion, or hydronephrosis. Bladder is unremarkable. Stomach/Bowel: Stomach is within normal limits. Appendectomy. The small bowel is well distended by negative oral enteric contrast. Mildly thickened and hyperenhancing loops of distal small bowel, similar to prior examination (series 3, image 123). Vascular/Lymphatic: No significant vascular findings are present. No enlarged abdominal or  pelvic lymph nodes. Reproductive: No mass or other significant abnormality. Ring contraceptive in the vaginal vault. Other: No abdominal wall hernia or abnormality. Small volume free fluid in the low pelvis. Musculoskeletal: No acute or significant osseous findings. IMPRESSION: 1. Mildly thickened and hyperenhancing loops of distal small bowel, similar to prior examination, consistent with nonspecific infectious or inflammatory enteritis. No specific stigmata of inflammatory bowel disease. No evidence of complicating stricture, obstruction, fistula, or abscess. 2. Small volume free fluid in the low pelvis, which may be reactive or functional. 3. Status post cholecystectomy and appendectomy. Electronically Signed   By: Marolyn JONETTA Jaksch M.D.   On: 10/02/2023 14:54   CT ABDOMEN PELVIS W CONTRAST Result Date: 09/30/2023 CLINICAL DATA:  Abdominal pain. EXAM: CT ABDOMEN AND PELVIS WITH CONTRAST TECHNIQUE: Multidetector CT imaging of the abdomen and pelvis was performed using the standard protocol following bolus administration of intravenous contrast. RADIATION DOSE REDUCTION: This exam was performed according to the departmental dose-optimization program which includes automated exposure control, adjustment of the mA and/or kV according to patient size and/or use of iterative reconstruction technique. CONTRAST:  75mL OMNIPAQUE  IOHEXOL  350 MG/ML SOLN COMPARISON:  08/12/2021 FINDINGS: Lower chest: No acute abnormality. Hepatobiliary: Stable focal fat deposition in the liver adjacent to the falciform ligament. Status post cholecystectomy. No biliary dilatation. Pancreas: Unremarkable. No pancreatic ductal dilatation or surrounding inflammatory changes. Spleen: Normal in size without focal abnormality. Adrenals/Urinary Tract: Adrenal glands are unremarkable. Kidneys are normal, without renal calculi, focal lesion, or hydronephrosis. Bladder is decompressed. Stomach/Bowel: Multiple thickened, hyperemic and mildly dilated small  bowel loops  in the pelvis containing fluid. Associated small amount of free fluid in the pelvis. No overt bowel obstruction or free intraperitoneal air. Appearance is suggestive of enteritis with potential for early partial small bowel obstruction. The appendix is surgically absent. Vascular/Lymphatic: No significant vascular findings are present. No enlarged abdominal or pelvic lymph nodes. Reproductive: Uterus and bilateral adnexa are unremarkable. Other: No hernias.  No focal abscess identified Musculoskeletal: No acute or significant osseous findings. IMPRESSION: 1. Multiple thickened, hyperemic and mildly dilated small bowel loops in the pelvis containing fluid. Associated small amount of free fluid in the pelvis. Appearance is suggestive of enteritis with potential for early partial small bowel obstruction. No overt bowel obstruction or free intraperitoneal air. 2. Status post cholecystectomy and appendectomy. Electronically Signed   By: Marcey Moan M.D.   On: 09/30/2023 16:06    Labs: CBC    Component Value Date/Time   WBC 7.9 10/01/2023 0500   RBC 5.06 10/01/2023 0500   HGB 14.5 10/01/2023 0500   HGB 15.6 (H) 09/04/2021 1018   HGB 14.2 05/28/2016 1558   HCT 42.1 10/01/2023 0500   HCT 39.9 05/28/2016 1558   PLT 216 10/01/2023 0500   PLT 267 09/04/2021 1018   PLT 194 05/28/2016 1558   MCV 83.2 10/01/2023 0500   MCV 81.6 05/28/2016 1558   MCH 28.7 10/01/2023 0500   MCHC 34.4 10/01/2023 0500   RDW 12.4 10/01/2023 0500   RDW 13.0 05/28/2016 1558   LYMPHSABS 1.2 09/30/2023 1422   LYMPHSABS 1.9 05/28/2016 1558   MONOABS 0.7 09/30/2023 1422   MONOABS 1.0 (H) 05/28/2016 1558   EOSABS 0.1 09/30/2023 1422   EOSABS 0.2 05/28/2016 1558   BASOSABS 0.0 09/30/2023 1422   BASOSABS 0.0 05/28/2016 1558    CMP     Component Value Date/Time   NA 136 10/01/2023 0500   NA 137 05/28/2016 1558   K 3.3 (L) 10/01/2023 0500   K 3.6 05/28/2016 1558   CL 109 10/01/2023 0500   CO2 18 (L)  10/01/2023 0500   CO2 23 05/28/2016 1558   GLUCOSE 93 10/01/2023 0500   GLUCOSE 88 05/28/2016 1558   BUN <5 (L) 10/01/2023 0500   BUN 5.0 (L) 05/28/2016 1558   CREATININE 1.04 (H) 10/01/2023 0500   CREATININE 0.6 05/28/2016 1558   CALCIUM 8.2 (L) 10/01/2023 0500   CALCIUM 9.4 05/28/2016 1558   PROT 5.6 (L) 10/01/2023 0500   PROT 7.0 05/28/2016 1558   ALBUMIN 2.9 (L) 10/01/2023 0500   ALBUMIN 3.7 05/28/2016 1558   AST 15 10/01/2023 0500   AST 18 05/28/2016 1558   ALT 12 10/01/2023 0500   ALT 14 05/28/2016 1558   ALKPHOS 52 10/01/2023 0500   ALKPHOS 61 05/28/2016 1558   BILITOT 1.1 10/01/2023 0500   BILITOT 0.51 05/28/2016 1558   GFRNONAA >60 10/01/2023 0500   GFRAA >60 03/30/2019 1258       Assessment and Plan:   Jeoffrey NUSAYBA CADENAS is a 38 y.o. y/o female   1.  Acute enteritis with recent hospitalization treated with Augmentin .  C. difficile and GI pathogen panel negative.  Alpha gal negative.  Slightly elevated CRP.  Abdominal pelvic CT showed mild thickening loops of distal small bowel, nonspecific infectious or inflammatory enteritis.  No stricture, obstruction, fistula, or abscess. - Labs: BMP - Fecal calprotectin  2.  Hypokalemia - Lab: BMP,  3.  Diarrhea -  celiac panal?  3.  Abnormal CT small intestine -EGD?   Ellouise Console, PA-C  Follow up ***

## 2023-11-16 NOTE — Discharge Summary (Signed)
 Physician Discharge Summary  Abigail James FMW:[id number] DOB: 06-17-1985 DOA: 09/30/2023  PCP: Franchot Lauraine Quant, MD (Inactive)  Admit date: 09/30/2023 Discharge date: 10/02/2023  Time spent: 35 minutes  Recommendations for Outpatient Follow-up:  [facility] in 2-3weeks   Discharge Diagnoses:  Principal Problem:   Enteritis Active Problems:   Abnormal CT scan, gastrointestinal tract   Migraine   Discharge Condition: improved  Diet recommendation: soft diet Advance as lytle Habermann Weights   09/30/23 1201  Weight: 79.8 kg    History of present illness:  37/F, works as a Engineer, site in neurosurgery clinic, history of intermittent episodes of abdominal pain and constipation, back in 2020 she was seen in the ER and was suspected to have diverticulitis, however-CT more suggestive of ileitis, after this in 2021 underwent a colonoscopy which was normal, since then has had few more episodes where she has abdominal pain and constipation, subjectively got better with different courses of antibiotics.  Has been doing well recently until yesterday developed severe abdominal pain cramps and ongoing diarrhea which has now become watery.  Denies fevers, some report of chills, ate a frozen burger yesterday and she thinks her symptoms started after this. ED Course: In the ER tachycardic lab, labs suggestive of hemoconcentration, CT with thickened inflamed small bowel loops suggestive of enteritis  Hospital Course:  Enteritis - In the background of recurrent episodes of ileitis and enteritis since 2020. - Colonoscopy 1/21 was unremarkable - CT this time suggestive of enteritis, could be exacerbated by viral illness, but unclear if she has an underlying disorder - GI pathogen panel is neg, improving, diet advanced, GI following checking serology, alpha gal> this is pending at DC -Pt declined EGD at this time per GI, advised FU in few weeks     H/o Migraines   Discharge Exam: Vitals:    10/02/23 0427 10/02/23 0708  BP: 111/78 116/73  Pulse: 71   Resp: 18 18  Temp: 98 F (36.7 C) 98.2 F (36.8 C)  SpO2: 100% 100%    Gen: Awake, Alert, Oriented X 3,  HEENT: no JVD Lungs: Good air movement bilaterally, CTAB CVS: S1S2/RRR Abd: soft, Non tender, non distended, BS present Extremities: No edema Skin: no new rashes on exposed skin   Discharge Instructions    Allergies as of 10/02/2023       Reactions   Ciprofloxacin Anaphylaxis   Isosorbide Other (See Comments)   Patient has terrible headaches and bodyaches on Imdur. Other reaction(s): Other (See Comments) Patient has terrible headaches and bodyaches on Imdur. Patient has terrible headaches and bodyaches on Imdur.   Latex Swelling   Vancomycin Nausea And Vomiting   Sulfa Antibiotics Swelling, Rash        Medication List     TAKE these medications    dicyclomine  10 MG capsule Commonly known as: BENTYL  Take 1 capsule (10 mg total) by mouth 3 (three) times daily as needed for spasms. Take 1 tablet 2-3 times daily as needed for abdominal pain/bloating.   etonogestrel -ethinyl estradiol  0.12-0.015 MG/24HR vaginal ring Commonly known as: NUVARING INSERT 1 RING VAGINALLY AS DIRECTED. REMOVE AFTER 3 WEEKS & WAIT 7 DAYS BEFORE INSERTING A NEW RING   Qulipta 60 MG Tabs Generic drug: Atogepant Take 1 tablet by mouth daily.       Allergies  Allergen Reactions   Ciprofloxacin Anaphylaxis   Isosorbide Other (See Comments)    Patient has terrible headaches and bodyaches on Imdur. Other reaction(s): Other (See Comments) Patient has  terrible headaches and bodyaches on Imdur. Patient has terrible headaches and bodyaches on Imdur.   Latex Swelling   Vancomycin Nausea And Vomiting   Sulfa Antibiotics Swelling and Rash      The results of significant diagnostics from this hospitalization (including imaging, microbiology, ancillary and laboratory) are listed below for reference.    Significant Diagnostic  Studies: No results found.  Microbiology: No results found for this or any previous visit (from the past 240 hours).   Labs: Basic Metabolic Panel: No results for input(s): NA, K, CL, CO2, GLUCOSE, BUN, CREATININE, CALCIUM, MG, PHOS in the last 168 hours. Liver Function Tests: No results for input(s): AST, ALT, ALKPHOS, BILITOT, PROT, ALBUMIN in the last 168 hours. No results for input(s): LIPASE, AMYLASE in the last 168 hours. No results for input(s): AMMONIA in the last 168 hours. CBC: No results for input(s): WBC, NEUTROABS, HGB, HCT, MCV, PLT in the last 168 hours. Cardiac Enzymes: No results for input(s): CKTOTAL, CKMB, CKMBINDEX, TROPONINI in the last 168 hours. BNP: BNP (last 3 results) No results for input(s): BNP in the last 8760 hours.  ProBNP (last 3 results) No results for input(s): PROBNP in the last 8760 hours.  CBG: No results for input(s): GLUCAP in the last 168 hours.     Signed:  Sigurd Pac MD.  [facility] 11/16/2023, 2:03 PM

## 2023-12-08 ENCOUNTER — Telehealth

## 2024-03-29 ENCOUNTER — Telehealth
